# Patient Record
Sex: Male | Born: 1960 | Race: Black or African American | Hispanic: No | Marital: Single | Smoking: Former smoker
Health system: Southern US, Community
[De-identification: ages and names within clinical notes are randomized; demographics above are authoritative.]

## PROBLEM LIST (undated history)

## (undated) DIAGNOSIS — M25519 Pain in unspecified shoulder: Secondary | ICD-10-CM

## (undated) DIAGNOSIS — B009 Herpesviral infection, unspecified: Secondary | ICD-10-CM

## (undated) DIAGNOSIS — Z87891 Personal history of nicotine dependence: Secondary | ICD-10-CM

## (undated) DIAGNOSIS — N529 Male erectile dysfunction, unspecified: Secondary | ICD-10-CM

## (undated) DIAGNOSIS — K5900 Constipation, unspecified: Secondary | ICD-10-CM

## (undated) DIAGNOSIS — E785 Hyperlipidemia, unspecified: Secondary | ICD-10-CM

## (undated) DIAGNOSIS — Z9889 Other specified postprocedural states: Secondary | ICD-10-CM

## (undated) DIAGNOSIS — K5790 Diverticulosis of intestine, part unspecified, without perforation or abscess without bleeding: Secondary | ICD-10-CM

## (undated) DIAGNOSIS — I10 Essential (primary) hypertension: Secondary | ICD-10-CM

## (undated) DIAGNOSIS — G8929 Other chronic pain: Secondary | ICD-10-CM

## (undated) DIAGNOSIS — T7840XA Allergy, unspecified, initial encounter: Secondary | ICD-10-CM

## (undated) DIAGNOSIS — R7301 Impaired fasting glucose: Secondary | ICD-10-CM

## (undated) DIAGNOSIS — Z9289 Personal history of other medical treatment: Secondary | ICD-10-CM

## (undated) HISTORY — DX: Constipation, unspecified: K59.00

## (undated) HISTORY — DX: Herpesviral infection, unspecified: B00.9

## (undated) HISTORY — DX: Diverticulosis of intestine, part unspecified, without perforation or abscess without bleeding: K57.90

## (undated) HISTORY — DX: Essential (primary) hypertension: I10

## (undated) HISTORY — DX: Other specified postprocedural states: Z98.890

## (undated) HISTORY — DX: Hyperlipidemia, unspecified: E78.5

## (undated) HISTORY — DX: Personal history of nicotine dependence: Z87.891

## (undated) HISTORY — DX: Personal history of other medical treatment: Z92.89

## (undated) HISTORY — PX: SPINE SURGERY: SHX786

## (undated) HISTORY — DX: Other chronic pain: G89.29

## (undated) HISTORY — DX: Impaired fasting glucose: R73.01

## (undated) HISTORY — DX: Male erectile dysfunction, unspecified: N52.9

## (undated) HISTORY — DX: Allergy, unspecified, initial encounter: T78.40XA

## (undated) HISTORY — DX: Pain in unspecified shoulder: M25.519

---

## 2005-12-11 ENCOUNTER — Emergency Department (HOSPITAL_COMMUNITY): Admission: EM | Admit: 2005-12-11 | Discharge: 2005-12-11 | Payer: Self-pay | Admitting: Emergency Medicine

## 2006-09-20 ENCOUNTER — Emergency Department (HOSPITAL_COMMUNITY): Admission: EM | Admit: 2006-09-20 | Discharge: 2006-09-20 | Payer: Self-pay | Admitting: Family Medicine

## 2006-10-16 ENCOUNTER — Emergency Department (HOSPITAL_COMMUNITY): Admission: EM | Admit: 2006-10-16 | Discharge: 2006-10-16 | Payer: Self-pay | Admitting: Family Medicine

## 2006-11-30 ENCOUNTER — Ambulatory Visit: Payer: Self-pay | Admitting: Family Medicine

## 2007-03-05 ENCOUNTER — Ambulatory Visit: Payer: Self-pay | Admitting: Family Medicine

## 2007-07-03 ENCOUNTER — Ambulatory Visit: Payer: Self-pay | Admitting: Family Medicine

## 2007-07-19 ENCOUNTER — Ambulatory Visit: Payer: Self-pay | Admitting: Family Medicine

## 2007-09-14 ENCOUNTER — Ambulatory Visit: Payer: Self-pay | Admitting: Family Medicine

## 2008-03-05 ENCOUNTER — Ambulatory Visit: Payer: Self-pay | Admitting: Family Medicine

## 2008-03-20 DIAGNOSIS — Z9289 Personal history of other medical treatment: Secondary | ICD-10-CM

## 2008-03-20 HISTORY — DX: Personal history of other medical treatment: Z92.89

## 2008-08-26 ENCOUNTER — Ambulatory Visit: Payer: Self-pay | Admitting: Family Medicine

## 2009-08-15 HISTORY — PX: OTHER SURGICAL HISTORY: SHX169

## 2009-09-10 ENCOUNTER — Ambulatory Visit: Payer: Self-pay | Admitting: Family Medicine

## 2010-01-15 ENCOUNTER — Ambulatory Visit: Payer: Self-pay | Admitting: Family Medicine

## 2010-04-05 ENCOUNTER — Ambulatory Visit: Payer: Self-pay | Admitting: Family Medicine

## 2010-04-22 ENCOUNTER — Encounter: Admission: RE | Admit: 2010-04-22 | Discharge: 2010-04-22 | Payer: Self-pay | Admitting: Family Medicine

## 2010-04-23 ENCOUNTER — Emergency Department (HOSPITAL_COMMUNITY): Admission: EM | Admit: 2010-04-23 | Discharge: 2010-04-23 | Payer: Self-pay | Admitting: Emergency Medicine

## 2010-07-21 ENCOUNTER — Inpatient Hospital Stay (HOSPITAL_COMMUNITY)
Admission: RE | Admit: 2010-07-21 | Discharge: 2010-07-23 | Payer: Self-pay | Source: Home / Self Care | Attending: Neurosurgery | Admitting: Neurosurgery

## 2010-09-04 ENCOUNTER — Encounter: Payer: Self-pay | Admitting: Neurosurgery

## 2010-09-05 ENCOUNTER — Encounter: Payer: Self-pay | Admitting: Family Medicine

## 2010-09-29 ENCOUNTER — Encounter (INDEPENDENT_AMBULATORY_CARE_PROVIDER_SITE_OTHER): Payer: BC Managed Care – PPO | Admitting: Family Medicine

## 2010-09-29 ENCOUNTER — Encounter: Payer: Self-pay | Admitting: Gastroenterology

## 2010-09-29 DIAGNOSIS — F528 Other sexual dysfunction not due to a substance or known physiological condition: Secondary | ICD-10-CM

## 2010-09-29 DIAGNOSIS — Z79899 Other long term (current) drug therapy: Secondary | ICD-10-CM

## 2010-09-29 DIAGNOSIS — Z0000 Encounter for general adult medical examination without abnormal findings: Secondary | ICD-10-CM

## 2010-09-29 DIAGNOSIS — E785 Hyperlipidemia, unspecified: Secondary | ICD-10-CM

## 2010-10-05 ENCOUNTER — Encounter (INDEPENDENT_AMBULATORY_CARE_PROVIDER_SITE_OTHER): Payer: Self-pay | Admitting: *Deleted

## 2010-10-06 NOTE — Letter (Signed)
Summary: Pre Visit Letter Revised  [facility]  [address] [place], [place] [address]   Phone: [phone]  Fax: [phone]        09/29/2010 MRN: [id number]  James Bird [address] [place], [place]  [address]             Procedure Date:  10-20-10 11:30am           Dr Russella Dar Direct Colon   Welcome to the Gastroenterology Division at [facility].    You are scheduled to see a nurse for your pre-procedure visit on 10-06-10 at 1:30pm on the 3rd floor at [facility], [address]  We ask that you try to arrive at our office 15 minutes prior to your appointment time to allow for check-in.  Please take a minute to review the attached form.  If you answer "Yes" to one or more of the questions on the first page, we ask that you call the person listed at your earliest opportunity.  If you answer "No" to all of the questions, please complete the rest of the form and bring it to your appointment.    Your nurse visit will consist of discussing your medical and surgical history, your immediate family medical history, and your medications.   If you are unable to list all of your medications on the form, please bring the medication bottles to your appointment and we will list them.  We will need to be aware of both prescribed and over the counter drugs.  We will need to know exact dosage information as well.    Please be prepared to read and sign documents such as consent forms, a financial agreement, and acknowledgement forms.  If necessary, and with your consent, a friend or relative is welcome to sit-in on the nurse visit with you.  Please bring your insurance card so that we may make a copy of it.  If your insurance requires a referral to see a specialist, please bring your referral form from your primary care physician.  No co-pay is required for this nurse visit.     If you cannot keep your appointment, please call [phone] to cancel or reschedule prior  to your appointment date.  This allows [place] the opportunity to schedule an appointment for another patient in need of care.    Thank you for choosing [facility] for your medical needs.  We appreciate the opportunity to care for you.  Please visit [place] at our website  to learn more about our practice.  Sincerely, The Gastroenterology Division

## 2010-10-11 ENCOUNTER — Encounter: Payer: Self-pay | Admitting: Internal Medicine

## 2010-10-14 HISTORY — PX: COLONOSCOPY: SHX174

## 2010-10-20 ENCOUNTER — Encounter: Payer: Self-pay | Admitting: Internal Medicine

## 2010-10-20 ENCOUNTER — Other Ambulatory Visit (AMBULATORY_SURGERY_CENTER): Payer: BC Managed Care – PPO | Admitting: Internal Medicine

## 2010-10-20 DIAGNOSIS — Z1211 Encounter for screening for malignant neoplasm of colon: Secondary | ICD-10-CM

## 2010-10-20 DIAGNOSIS — K5730 Diverticulosis of large intestine without perforation or abscess without bleeding: Secondary | ICD-10-CM

## 2010-10-21 NOTE — Miscellaneous (Signed)
Summary: [facility]  Clinical Lists Changes  Medications: Added new medication of MOVIPREP 100 GM  SOLR (PEG-KCL-NACL-NASULF-NA ASC-C) As per prep instructions. - Signed Rx of MOVIPREP 100 GM  SOLR (PEG-KCL-NACL-NASULF-NA ASC-C) As per prep instructions.;  #1 x 0;  Signed;  Entered by: Ezra Sites RN;  Authorized by: Hilarie Fredrickson MD;  Method used: Electronically to [facility]. [phone]*, [address] [place], [place]  [address], Ph: [phone], Fax: [phone] Observations: Added new observation of NKA: T (10/11/2010 15:38)    Prescriptions: MOVIPREP 100 GM  SOLR (PEG-KCL-NACL-NASULF-NA ASC-C) As per prep instructions.  #1 x 0   Entered by:   Ezra Sites RN   Authorized by:   Hilarie Fredrickson MD   Signed by:   Ezra Sites RN on 10/11/2010   Method used:   Electronically to        [facility] [address] [phone]* (retail)       [address] [place], [place]  [address]       Ph: [phone]       Fax: [phone]   RxID:   [id number]

## 2010-10-21 NOTE — Letter (Signed)
Summary: [facility] Instructions  [facility]  [address] [place], [place] [address]   Phone: [phone]  Fax: [phone]       JEX STRAUSBAUGH    1961-03-04    MRN: [id number]        Procedure Day Dorna Bloom:  [facility] 10/20/10     Arrival Time: 10:30am     Procedure Time: 11:30am     Location of Procedure:                    _ X_   Endoscopy Center (4th Floor)                        PREPARATION FOR COLONOSCOPY WITH MOVIPREP   Starting 5 days prior to your procedure FRIDAY 03/02  do not eat nuts, seeds, popcorn, corn, beans, peas,  salads, or any raw vegetables.  Do not take any fiber supplements (e.g. Metamucil, Citrucel, and Benefiber).  THE DAY BEFORE YOUR PROCEDURE         DATE: TUESDAY  03/06  1.  Drink clear liquids the entire day-NO SOLID FOOD  2.  Do not drink anything colored red or purple.  Avoid juices with pulp.  No orange juice.  3.  Drink at least 64 oz. (8 glasses) of fluid/clear liquids during the day to prevent dehydration and help the prep work efficiently.  CLEAR LIQUIDS INCLUDE: Water Jello Ice Popsicles Tea (sugar ok, no milk/cream) Powdered fruit flavored drinks Coffee (sugar ok, no milk/cream) Gatorade Juice: apple, white grape, white cranberry  Lemonade Clear bullion, consomm, broth Carbonated beverages (any kind) Strained chicken noodle soup Hard Candy                             4.  In the morning, mix first dose of MoviPrep solution:    Empty 1 Pouch A and 1 Pouch B into the disposable container    Add lukewarm drinking water to the top line of the container. Mix to dissolve    Refrigerate (mixed solution should be used within 24 hrs)  5.  Begin drinking the prep at 5:00 p.m. The MoviPrep container is divided by 4 marks.   Every 15 minutes drink the solution down to the next mark (approximately 8 oz) until the full liter is complete.   6.  Follow completed prep with 16 oz of clear liquid of your choice  (Nothing red or purple).  Continue to drink clear liquids until bedtime.  7.  Before going to bed, mix second dose of MoviPrep solution:    Empty 1 Pouch A and 1 Pouch B into the disposable container    Add lukewarm drinking water to the top line of the container. Mix to dissolve    Refrigerate  THE DAY OF YOUR PROCEDURE      DATE:  [facility]  03/07  Beginning at 6:30a.m. (5 hours before procedure):         1. Every 15 minutes, drink the solution down to the next mark (approx 8 oz) until the full liter is complete.  2. Follow completed prep with 16 oz. of clear liquid of your choice.    3. You may drink clear liquids until 9:30am  (2 HOURS BEFORE PROCEDURE).   MEDICATION INSTRUCTIONS  Unless otherwise instructed, you should take regular prescription medications with a small sip of water   as early as possible the morning of  your procedure.    Additional medication instructions: Do not take Lisinopril/HCTZ day of procedure.         OTHER INSTRUCTIONS  You will need a responsible adult at least 51 years of age to accompany you and drive you home.   This person must remain in the waiting room during your procedure.  Wear loose fitting clothing that is easily removed.  Leave jewelry and other valuables at home.  However, you may wish to bring a book to read or  an iPod/MP3 player to listen to music as you wait for your procedure to start.  Remove all body piercing jewelry and leave at home.  Total time from sign-in until discharge is approximately 2-3 hours.  You should go home directly after your procedure and rest.  You can resume normal activities the  day after your procedure.  The day of your procedure you should not:   Drive   Make legal decisions   Operate machinery   Drink alcohol   Return to work  You will receive specific instructions about eating, activities and medications before you leave.    The above instructions have been reviewed and  explained to me by   Ezra Sites RN  October 11, 2010 4:02 PM    I fully understand and can verbalize these instructions _____________________________ Date _________

## 2010-10-25 LAB — BASIC METABOLIC PANEL
CO2: 28 mEq/L (ref 19–32)
Chloride: 104 mEq/L (ref 96–112)
Creatinine, Ser: 0.89 mg/dL (ref 0.4–1.5)
GFR calc Af Amer: >60 mL/min (ref >60)

## 2010-10-25 LAB — CBC
MCH: 31.9 pg (ref 26.0–34.0)
MCV: 87.4 fL (ref 78.0–100.0)
Platelets: 207 10*3/uL (ref 150–400)
RBC: 4.99 MIL/uL (ref 4.22–5.81)

## 2010-10-26 NOTE — Procedures (Signed)
Summary: Colonoscopy  Patient: James Bird Note: All result statuses are Final unless otherwise noted.  Tests: (1) Colonoscopy (COL)   COL Colonoscopy           DONE     [facility]     [address]     [place], [place]  [address]          COLONOSCOPY PROCEDURE REPORT          PATIENT:  James Bird, James Bird  MR#:  [id number]     BIRTHDATE:  [birth year], 49 yrs. old  GENDER:  male     ENDOSCOPIST:  Wilhemina Bonito. Eda Keys, MD     REF. BY:  Sharlot Gowda, M.D.     PROCEDURE DATE:  10/20/2010     PROCEDURE:  Average-risk screening colonoscopy     G0121     ASA CLASS:  Class II     INDICATIONS:  Routine Risk Screening     MEDICATIONS:   Fentanyl 75 mcg IV, Versed 8 mg IV          DESCRIPTION OF PROCEDURE:   After the risks benefits and     alternatives of the procedure were thoroughly explained, informed     consent was obtained.  Digital rectal exam was performed and     revealed no abnormalities.   The LB 180AL [id number] endoscope was     introduced through the anus and advanced to the cecum, which was     identified by both the appendix and ileocecal valve, without     limitations.Time to cecum = 3:58 min. The quality of the prep was     good, using MoviPrep.  The instrument was then slowly withdrawn     (time = 8:56 min) as the colon was fully examined.     <<PROCEDUREIMAGES>>          FINDINGS:  Mild diverticulosis was found in the sigmoid colon.     Otherwise normal colonoscopy without  polyps, masses, vascular     ectasias, or inflammatory changes.   Retroflexed views in the     rectum revealed no abnormalities.    The scope was then withdrawn     from the patient and the procedure completed.          COMPLICATIONS:  None          ENDOSCOPIC IMPRESSION:     1) Mild diverticulosis in the sigmoid colon     2) Otherwise normal colonoscopy          RECOMMENDATIONS:     1) Continue current colorectal screening recommendations for     "routine risk" patients with a  repeat colonoscopy in 10 years.          ______________________________     Wilhemina Bonito. Eda Keys, MD          CC:  Sharlot Gowda, MD; The Patient          n.     eSIGNED:   Wilhemina Bonito. Eda Keys at 10/20/2010 11:54 AM          Alric Seton, [id number]  Note: An exclamation mark (!) indicates a result that was not dispersed into the flowsheet. Document Creation Date: 10/20/2010 11:54 AM _______________________________________________________________________  (1) Order result status: Final Collection or observation date-time: 10/20/2010 11:44 Requested date-time:  Receipt date-time:  Reported date-time:  Referring Physician:   Ordering Physician: Fransico Setters [phone]) Specimen Source:  Source: Launa Grill Order Number: [id number] Lab  site:   Appended Document: Colonoscopy    Clinical Lists Changes  Observations: Added new observation of COLONNXTDUE: 10/2020 (10/20/2010 12:06)

## 2010-11-12 ENCOUNTER — Other Ambulatory Visit: Payer: Self-pay | Admitting: Neurosurgery

## 2010-11-12 DIAGNOSIS — M549 Dorsalgia, unspecified: Secondary | ICD-10-CM

## 2010-11-12 DIAGNOSIS — M5410 Radiculopathy, site unspecified: Secondary | ICD-10-CM

## 2011-06-17 ENCOUNTER — Other Ambulatory Visit: Payer: Self-pay | Admitting: Family Medicine

## 2011-06-20 NOTE — Telephone Encounter (Signed)
Is this okay to refill? 

## 2011-07-12 ENCOUNTER — Encounter: Payer: Self-pay | Admitting: Family Medicine

## 2011-09-15 ENCOUNTER — Other Ambulatory Visit: Payer: Self-pay | Admitting: Family Medicine

## 2011-09-15 NOTE — Telephone Encounter (Signed)
IS THIS OK 

## 2011-09-19 NOTE — Telephone Encounter (Signed)
Is this ok?

## 2011-11-02 ENCOUNTER — Ambulatory Visit (INDEPENDENT_AMBULATORY_CARE_PROVIDER_SITE_OTHER): Payer: BC Managed Care – PPO | Admitting: Medical

## 2011-11-02 ENCOUNTER — Encounter: Payer: Self-pay | Admitting: Medical

## 2011-11-02 VITALS — BP 110/80 | HR 80 | Temp 98.1°F | Resp 16 | Ht 71.0 in | Wt 237.0 lb

## 2011-11-02 DIAGNOSIS — Z125 Encounter for screening for malignant neoplasm of prostate: Secondary | ICD-10-CM

## 2011-11-02 DIAGNOSIS — N529 Male erectile dysfunction, unspecified: Secondary | ICD-10-CM | POA: Insufficient documentation

## 2011-11-02 DIAGNOSIS — L738 Other specified follicular disorders: Secondary | ICD-10-CM

## 2011-11-02 DIAGNOSIS — B353 Tinea pedis: Secondary | ICD-10-CM

## 2011-11-02 DIAGNOSIS — R7301 Impaired fasting glucose: Secondary | ICD-10-CM | POA: Insufficient documentation

## 2011-11-02 DIAGNOSIS — E663 Overweight: Secondary | ICD-10-CM | POA: Insufficient documentation

## 2011-11-02 DIAGNOSIS — I10 Essential (primary) hypertension: Secondary | ICD-10-CM | POA: Insufficient documentation

## 2011-11-02 DIAGNOSIS — F17200 Nicotine dependence, unspecified, uncomplicated: Secondary | ICD-10-CM

## 2011-11-02 DIAGNOSIS — B350 Tinea barbae and tinea capitis: Secondary | ICD-10-CM

## 2011-11-02 DIAGNOSIS — Z0000 Encounter for general adult medical examination without abnormal findings: Secondary | ICD-10-CM

## 2011-11-02 LAB — CBC WITH DIFFERENTIAL/PLATELET
Basophils Absolute: 0 10*3/uL (ref 0.0–0.1)
Lymphocytes Relative: 33 % (ref 12–46)
Lymphs Abs: 2.4 10*3/uL (ref 0.7–4.0)
Neutro Abs: 4.2 10*3/uL (ref 1.7–7.7)
Neutrophils Relative %: 59 % (ref 43–77)
Platelets: 257 10*3/uL (ref 150–400)
RBC: 5.16 MIL/uL (ref 4.22–5.81)
WBC: 7.1 10*3/uL (ref 4.0–10.5)

## 2011-11-02 LAB — COMPREHENSIVE METABOLIC PANEL
Albumin: 4.7 g/dL (ref 3.5–5.2)
CO2: 27 mEq/L (ref 19–32)
Calcium: 9.9 mg/dL (ref 8.4–10.5)
Chloride: 101 mEq/L (ref 96–112)
Glucose, Bld: 83 mg/dL (ref 70–99)
Potassium: 4.1 mEq/L (ref 3.5–5.3)
Sodium: 141 mEq/L (ref 135–145)
Total Bilirubin: 0.9 mg/dL (ref 0.3–1.2)
Total Protein: 7.5 g/dL (ref 6.0–8.3)

## 2011-11-02 LAB — PSA: PSA: 2.29 ng/mL (ref <=4.00)

## 2011-11-02 LAB — HEMOGLOBIN A1C: Mean Plasma Glucose: 117 mg/dL — ABNORMAL HIGH (ref <117)

## 2011-11-02 LAB — LIPID PANEL
Cholesterol: 212 mg/dL — ABNORMAL HIGH (ref 0–200)
Triglycerides: 132 mg/dL (ref <150)

## 2011-11-02 MED ORDER — BUPROPION HCL ER (XL) 150 MG PO TB24: 150.0000 mg | ORAL_TABLET | Freq: Every day | ORAL | Status: AC

## 2011-11-02 MED ORDER — SILDENAFIL CITRATE 100 MG PO TABS: 100.0000 mg | ORAL_TABLET | ORAL | Status: DC | PRN

## 2011-11-02 MED ORDER — LISINOPRIL-HYDROCHLOROTHIAZIDE 10-12.5 MG PO TABS: 1.0000 | ORAL_TABLET | Freq: Every day | ORAL | Status: DC

## 2011-11-02 MED ORDER — VARENICLINE TARTRATE 0.5 MG X 11 & 1 MG X 42 PO MISC: ORAL | Status: DC

## 2011-11-02 NOTE — Patient Instructions (Addendum)
Preventative Care for Adults, Male       REGULAR HEALTH EXAMS:  A routine yearly physical is a good way to check in with your primary care provider about your health and preventive screening. It is also an opportunity to share updates about your health and any concerns you have, and receive a thorough all-over exam.   Most health insurance companies pay for at least some preventative services.  Check with your health plan for specific coverages.  WHAT PREVENTATIVE SERVICES DO MEN NEED?  Adult men should have their weight and blood pressure checked regularly.   Men age 52 and older should have their cholesterol levels checked regularly.  Beginning at age 52 and continuing to age 75, men should be screened for colorectal cancer.  Certain people should may need continued testing until age 85.  Other cancer screening may include exams for testicular and prostate cancer.  Updating vaccinations is part of preventative care.  Vaccinations help protect against diseases such as the flu.  Lab tests are generally done as part of preventative care to screen for anemia and blood disorders, to screen for problems with the kidneys and liver, to screen for bladder problems, to check blood sugar, and to check your cholesterol level.  Preventative services generally include counseling about diet, exercise, avoiding tobacco, drugs, excessive alcohol consumption, and sexually transmitted infections.    GENERAL RECOMMENDATIONS FOR GOOD HEALTH:  Healthy diet:  Eat a variety of foods, including fruit, vegetables, animal or vegetable protein, such as meat, fish, chicken, and eggs, or beans, lentils, tofu, and grains, such as rice.  Drink plenty of water daily.  Decrease saturated fat in the diet, avoid lots of red meat, processed foods, sweets, fast foods, and fried foods.  Exercise:  Aerobic exercise helps maintain good heart health. At least 30-40 minutes of moderate-intensity exercise is recommended.  For example, a brisk walk that increases your heart rate and breathing. This should be done on most days of the week.   Find a type of exercise or a variety of exercises that you enjoy so that it becomes a part of your daily life.  Examples are running, walking, swimming, water aerobics, and biking.  For motivation and support, explore group exercise such as aerobic class, spin class, Zumba, Yoga,or  martial arts, etc.    Set exercise goals for yourself, such as a certain weight goal, walk or run in a race such as a 5k walk/run.  Speak to your primary care provider about exercise goals.  Disease prevention:  If you smoke or chew tobacco, find out from your caregiver how to quit. It can literally save your life, no matter how long you have been a tobacco user. If you do not use tobacco, never begin.   Maintain a healthy diet and normal weight. Increased weight leads to problems with blood pressure and diabetes.   The Body Mass Index or BMI is a way of measuring how much of your body is fat. Having a BMI above 27 increases the risk of heart disease, diabetes, hypertension, stroke and other problems related to obesity. Your caregiver can help determine your BMI and based on it develop an exercise and dietary program to help you achieve or maintain this important measurement at a healthful level.  High blood pressure causes heart and blood vessel problems.  Persistent high blood pressure should be treated with medicine if weight loss and exercise do not work.   Fat and cholesterol leaves deposits in your arteries   that can block them. This causes heart disease and vessel disease elsewhere in your body.  If your cholesterol is found to be high, or if you have heart disease or certain other medical conditions, then you may need to have your cholesterol monitored frequently and be treated with medication.   Ask if you should have a stress test if your history suggests this. A stress test is a test done on  a treadmill that looks for heart disease. This test can find disease prior to there being a problem.  Avoid drinking alcohol in excess (more than two drinks per day).  Avoid use of street drugs. Do not share needles with anyone. Ask for professional help if you need assistance or instructions on stopping the use of alcohol, cigarettes, and/or drugs.  Brush your teeth twice a day with fluoride toothpaste, and floss once a day. Good oral hygiene prevents tooth decay and gum disease. The problems can be painful, unattractive, and can cause other health problems. Visit your dentist for a routine oral and dental check up and preventive care every 6-12 months.   Look at your skin regularly.  Use a mirror to look at your back. Notify your caregivers of changes in moles, especially if there are changes in shapes, colors, a size larger than a pencil eraser, an irregular border, or development of new moles.  Safety:  Use seatbelts 100% of the time, whether driving or as a passenger.  Use safety devices such as hearing protection if you work in environments with loud noise or significant background noise.  Use safety glasses when doing any work that could send debris in to the eyes.  Use a helmet if you ride a bike or motorcycle.  Use appropriate safety gear for contact sports.  Talk to your caregiver about gun safety.  Use sunscreen with a SPF (or skin protection factor) of 15 or greater.  Lighter skinned people are at a greater risk of skin cancer. Don't forget to also wear sunglasses in order to protect your eyes from too much damaging sunlight. Damaging sunlight can accelerate cataract formation.   Practice safe sex. Use condoms. Condoms are used for birth control and to help reduce the spread of sexually transmitted infections (or STIs).  Some of the STIs are gonorrhea (the clap), chlamydia, syphilis, trichomonas, herpes, HPV (human papilloma virus) and HIV (human immunodeficiency virus) which causes AIDS.  The herpes, HIV and HPV are viral illnesses that have no cure. These can result in disability, cancer and death.   Keep carbon monoxide and smoke detectors in your home functioning at all times. Change the batteries every 6 months or use a model that plugs into the wall.   Vaccinations:  Stay up to date with your tetanus shots and other required immunizations. You should have a booster for tetanus every 10 years. Be sure to get your flu shot every year, since 5%-20% of the [place] population comes down with the flu. The flu vaccine changes each year, so being vaccinated once is not enough. Get your shot in the fall, before the flu season peaks.   Other vaccines to consider:  Pneumococcal vaccine to protect against certain types of pneumonia.  This is normally recommended for adults age 65 or older.  However, adults younger than 52 years old with certain underlying conditions such as diabetes, heart or lung disease should also receive the vaccine.  Shingles vaccine to protect against Varicella Zoster if you are older than age 60, or younger   than 52 years old with certain underlying illness.  Hepatitis A vaccine to protect against a form of infection of the liver by a virus acquired from food.  Hepatitis B vaccine to protect against a form of infection of the liver by a virus acquired from blood or body fluids, particularly if you work in health care.  If you plan to travel internationally, check with your local health department for specific vaccination recommendations.  Cancer Screening:  Most routine colon cancer screening begins at the age of 16. On a yearly basis, doctors may provide special easy to use take-home tests to check for hidden blood in the stool. Sigmoidoscopy or colonoscopy can detect the earliest forms of colon cancer and is life saving. These tests use a small camera at the end of a tube to directly examine the colon. Speak to your caregiver about this at age 52, when routine  screening begins (and is repeated every 5 years unless early forms of pre-cancerous polyps or small growths are found).   At the age of 85 men usually start screening for prostate cancer every year. Screening may begin at a younger age for those with higher risk. Those at higher risk include African-Americans or having a family history of prostate cancer. There are two types of tests for prostate cancer:   Prostate-specific antigen (PSA) testing. Recent studies raise questions about prostate cancer using PSA and you should discuss this with your caregiver.   Digital rectal exam (in which your doctor's lubricated and gloved finger feels for enlargement of the prostate through the anus).   Screening for testicular cancer.  Do a monthly exam of your testicles. Gently roll each testicle between your thumb and fingers, feeling for any abnormal lumps. The best time to do this is after a hot shower or bath when the tissues are looser. Notify your caregivers of any lumps, tenderness or changes in size or shape immediately.        YOU CAN QUIT SMOKING!  Talk to your medical provider about using medicines to help you quit. These include nicotine replacement gum, lozenges, or skin patches.  Consider calling 1-800-QUIT-NOW, a toll free 24/7 hotline with free counseling to help you quit.  If you are ready to quit smoking or are thinking about it, congratulations! You have chosen to help yourself be healthier and live longer! There are lots of different ways to quit smoking. Nicotine gum, nicotine patches, a nicotine inhaler, or nicotine nasal spray can help with physical craving. Hypnosis, support groups, and medicines help break the habit of smoking. TIPS TO GET OFF AND STAY OFF CIGARETTES  Learn to predict your moods. Do not let a bad situation be your excuse to have a cigarette. Some situations in your life might tempt you to have a cigarette.   Ask friends and co-workers not to smoke around you.    Make your home smoke-free.   Never have "just one" cigarette. It leads to wanting another and another. Remind yourself of your decision to quit.   On a card, make a list of your reasons for not smoking. Read it at least the same number of times a day as you have a cigarette. Tell yourself everyday, "I do not want to smoke. I choose not to smoke."   Ask someone at home or work to help you with your plan to quit smoking.   Have something planned after you eat or have a cup of coffee. Take a walk or get other exercise  to perk you up. This will help to keep you from overeating.   Try a relaxation exercise to calm you down and decrease your stress. Remember, you may be tense and nervous the first two weeks after you quit. This will pass.   Find new activities to keep your hands busy. Play with a pen, coin, or rubber band. Doodle or draw things on paper.   Brush your teeth right after eating. This will help cut down the craving for the taste of tobacco after meals. You can try mouthwash too.   Try gum, breath mints, or diet candy to keep something in your mouth.  IF YOU SMOKE AND WANT TO QUIT:  Do not stock up on cigarettes. Never buy a carton. Wait until one pack is finished before you buy another.   Never carry cigarettes with you at work or at home.   Keep cigarettes as far away from you as possible. Leave them with someone else.   Never carry matches or a lighter with you.   Ask yourself, "Do I need this cigarette or is this just a reflex?"   Bet with someone that you can quit. Put cigarette money in a piggy bank every morning. If you smoke, you give up the money. If you do not smoke, by the end of the week, you keep the money.   Keep trying. It takes 21 days to change a habit!  Document Released: 05/28/2009 Document Revised: 04/13/2011 Document Reviewed: 05/28/2009 [facility] Patient Information 2012 [place], [place].

## 2011-11-02 NOTE — Progress Notes (Signed)
Subjective:   HPI  James Bird is a 51 y.o. male who presents for a complete physical.  He has a few c/o.  He notes some flaking of his skin of feet. He is interested in medication to help stop smoking.  He also has moles he is concerned about. Wants diabetes screening.  He is fasting today.   Preventative care: Last ophthalmology - over 1 year ago Last dental in last 3 months Gets yearly flu shot.  Reviewed their medical, surgical, family, social, medication, and allergy history and updated chart as appropriate.  Past Medical History  Diagnosis Date  . Hyperlipidemia   . Hypertension   . ED (erectile dysfunction)   . Allergy     RHINITS  . Diverticulosis   . Impaired fasting glucose   . Substance abuse     TOBACCO (SMOKER)    Past Surgical History  Procedure Date  . Spine surgery 2011(JENKINS,MD)    CERVICAL DISC AND FUSION  . Colonoscopy 10/2010    diverticulosis, othewrise normal, repeat 10 years, Dr. Marina Goodell    Family History  Problem Relation Age of Onset  . Cancer Mother     LUNG  . Heart disease Father     MI  . Cancer Paternal Aunt   . Cancer Paternal Uncle   . Hypertension Brother   . Diabetes Neg Hx   . Stroke Neg Hx     History   Social History  . Marital Status: Single    Spouse Name: N/A    Number of Children: N/A  . Years of Education: N/A   Occupational History  . Building control surveyor    Social History Main Topics  . Smoking status: Current Everyday Smoker -- 0.5 packs/day for 15 years    Types: Cigarettes  . Smokeless tobacco: Not on file  . Alcohol Use: 3.0 oz/week    5 Cans of beer per week  . Drug Use: No  . Sexually Active: Not on file   Other Topics Concern  . Not on file   Social History Narrative   Single, 2 daughters, exercise - raquetball and walking    Current Outpatient Prescriptions on File Prior to Visit  Medication Sig Dispense Refill  . lisinopril-hydrochlorothiazide (PRINZIDE,ZESTORETIC) 10-12.5 MG per  tablet TAKE 1 TABLET BY MOUTH EVERY DAY  90 tablet  3  . VIAGRA 100 MG tablet TAKE AS DIRECTED  10 tablet  0    No Known Allergies  Review of Systems Constitutional: -fever, -chills, -sweats, -unexpected weight change, -anorexia, -fatigue Allergy: -sneezing, -itching, -congestion Dermatology: denies changing moles, rash, lumps, new worrisome lesions ENT: -runny nose, -ear pain, -sore throat, -hoarseness, -sinus pain, -teeth pain, -tinnitus, -hearing loss, -epistaxis Cardiology:  -chest pain, -palpitations, -edema, -orthopnea, -paroxysmal nocturnal dyspnea Respiratory: +cough, -shortness of breath, -dyspnea on exertion, -wheezing, -hemoptysis Gastroenterology: -abdominal pain, -nausea, -vomiting, -diarrhea, -constipation, -blood in stool, -changes in bowel movement, -dysphagia Hematology: -bleeding or bruising problems Musculoskeletal: -arthralgias, +myalgias, -joint swelling, -back pain, -neck pain, -cramping, -gait changes Ophthalmology: -vision changes, -eye redness, -itching, -discharge Urology: -dysuria, -difficulty urinating, -hematuria, -urinary frequency, -urgency, incontinence Neurology: -headache, -weakness, -tingling, -numbness, -speech abnormality, -memory loss, -falls, -dizziness Psychology:  -depressed mood, -agitation, -sleep problems      Objective:   Physical Exam  Filed Vitals:   11/02/11 1400  BP: 110/80  Pulse: 80  Temp: 98.1 F (36.7 C)  Resp: 16    General appearance: alert, no distress, WD/WN, overweight black male Skin: left neck with 3  large skin tags, right medial 3rd finger with wart, several benign appearing macules of face, and erythema and raised papular lesions of scalp suggestive of seborrheic dermatitis and similar inflamed lesion of neck suggestive of folliculitis barbae HEENT: normocephalic, conjunctiva/corneas normal, sclerae anicteric, PERRLA, EOMi, nares patent, no discharge or erythema, pharynx normal Oral cavity: MMM, tongue normal, teeth  in good repair Neck: supple, no lymphadenopathy, no thyromegaly, no masses, normal ROM, no bruits Chest: non tender, normal shape and expansion Heart: RRR, normal S1, S2, no murmurs Lungs: CTA bilaterally, no wheezes, rhonchi, or rales Abdomen: +bs, soft, +1.3cm umbilical hernia, reducible, otherwise non tender, non distended, no masses, no hepatomegaly, no splenomegaly, no bruits Back: non tender, mild tenderness lumbar region, decrease ROM due to chronic low back pain, no scoliosis Musculoskeletal: upper extremities non tender, no obvious deformity, normal ROM throughout, lower extremities non tender, no obvious deformity, normal ROM throughout Extremities: no edema, no cyanosis, no clubbing Pulses: 2+ symmetric, upper and lower extremities, normal cap refill Neurological: alert, oriented x 3, CN2-12 intact, strength normal upper extremities and lower extremities, sensation normal throughout, DTRs 2+ throughout, no cerebellar signs, gait normal Psychiatric: normal affect, behavior normal, pleasant  GU: normal male external genitalia, uncircumcised, nontender, no masses, no hernia, no lymphadenopathy Rectal: anus normal, prostate WNL, no obvious hemorrhoids, occult neg stool   Assessment and Plan :    Encounter Diagnoses  Name Primary?  . Routine general medical examination at a health care facility Yes  . ED (erectile dysfunction)   . Essential hypertension, benign   . Impaired fasting blood sugar   . Screening for prostate cancer   . Tobacco use disorder   . Tinea pedis   . Overweight     Physical exam - discussed healthy lifestyle, diet, exercise, preventative care, vaccinations, and addressed their concerns.  Tdap and flu shot UTD.  Referral to dermatology for multiple skin issues (skin tags, folliculitis barbae, seborrheic dermatitis).   ED - doing well on Viagra, refills today  HTN - controlled on current medication, refills today  Impaired fasting glucose - labs  today  PSA screening today  Tobacco use disorder - discussed dangers of use, he agrees to begin Wellbutrin and seems motivated.  discussed risks/benefits of medications.  recheck 78mo.    Tinea pedis - begin OTC Tinactin QHS x 1-2 mo  Overweight - discussed needs to lose weight, will work on diet and exercise   Follow-up pending labs

## 2012-01-12 ENCOUNTER — Ambulatory Visit
Admission: RE | Admit: 2012-01-12 | Discharge: 2012-01-12 | Disposition: A | Discharge status: Home / Self Care | Payer: BC Managed Care – PPO | Source: Ambulatory Visit | Attending: Neurosurgery | Admitting: Neurosurgery

## 2012-01-12 DIAGNOSIS — M549 Dorsalgia, unspecified: Secondary | ICD-10-CM

## 2012-01-12 DIAGNOSIS — M5410 Radiculopathy, site unspecified: Secondary | ICD-10-CM

## 2012-03-01 ENCOUNTER — Ambulatory Visit (INDEPENDENT_AMBULATORY_CARE_PROVIDER_SITE_OTHER): Payer: BC Managed Care – PPO | Admitting: Family Medicine

## 2012-03-01 ENCOUNTER — Encounter: Payer: Self-pay | Admitting: Family Medicine

## 2012-03-01 VITALS — BP 118/78 | HR 88 | Temp 99.7°F | Wt 226.0 lb

## 2012-03-01 DIAGNOSIS — B349 Viral infection, unspecified: Secondary | ICD-10-CM

## 2012-03-01 DIAGNOSIS — B9789 Other viral agents as the cause of diseases classified elsewhere: Secondary | ICD-10-CM

## 2012-03-01 NOTE — Patient Instructions (Signed)
You can take 4 ibuprofen 3 times per day for the fever aches and pains

## 2012-03-01 NOTE — Progress Notes (Signed)
  Subjective:    Patient ID: James Bird, male    DOB: [birth year], 51 y.o.   MRN: [id number]  HPI He has a three-day history of fever and aching. No sore throat, earache, PND, nasal congestion with nausea and fatigue.  Review of Systems     Objective:   Physical Exam alert and in no distress. Tympanic membranes and canals are normal. Throat is clear. Tonsils are normal. Neck is supple without adenopathy or thyromegaly. Cardiac exam shows a regular sinus rhythm without murmurs or gallops. Lungs are clear to auscultation.        Assessment & Plan:   1. Viral syndrome    supportive care. If still having difficulty along week, return for further evaluation.

## 2012-10-17 ENCOUNTER — Telehealth: Payer: Self-pay | Admitting: Internal Medicine

## 2012-10-17 MED ORDER — LISINOPRIL-HYDROCHLOROTHIAZIDE 10-12.5 MG PO TABS: 1.0000 | ORAL_TABLET | Freq: Every day | ORAL | Status: DC

## 2012-10-17 NOTE — Telephone Encounter (Signed)
SENT IN MEDS 

## 2012-11-13 ENCOUNTER — Telehealth: Payer: Self-pay | Admitting: Internal Medicine

## 2012-11-13 MED ORDER — SILDENAFIL CITRATE 100 MG PO TABS: 100.0000 mg | ORAL_TABLET | ORAL | Status: DC | PRN

## 2012-11-13 NOTE — Telephone Encounter (Signed)
Refill request for Viagra 100mg  to [facility]

## 2012-11-13 NOTE — Telephone Encounter (Signed)
Due for yearly CPX

## 2012-11-14 NOTE — Telephone Encounter (Signed)
Patient is aware and he scheduled his appointment for his physical. CLS

## 2012-11-20 ENCOUNTER — Encounter: Payer: Self-pay | Admitting: Internal Medicine

## 2012-11-28 ENCOUNTER — Encounter: Payer: Self-pay | Admitting: Medical

## 2012-11-28 ENCOUNTER — Ambulatory Visit (INDEPENDENT_AMBULATORY_CARE_PROVIDER_SITE_OTHER): Payer: BC Managed Care – PPO | Admitting: Medical

## 2012-11-28 VITALS — BP 118/80 | HR 72 | Temp 98.6°F | Resp 16 | Ht 71.5 in | Wt 233.0 lb

## 2012-11-28 DIAGNOSIS — M25519 Pain in unspecified shoulder: Secondary | ICD-10-CM

## 2012-11-28 DIAGNOSIS — R5383 Other fatigue: Secondary | ICD-10-CM

## 2012-11-28 DIAGNOSIS — Z23 Encounter for immunization: Secondary | ICD-10-CM

## 2012-11-28 DIAGNOSIS — F17200 Nicotine dependence, unspecified, uncomplicated: Secondary | ICD-10-CM

## 2012-11-28 DIAGNOSIS — D239 Other benign neoplasm of skin, unspecified: Secondary | ICD-10-CM

## 2012-11-28 DIAGNOSIS — N529 Male erectile dysfunction, unspecified: Secondary | ICD-10-CM

## 2012-11-28 DIAGNOSIS — M25561 Pain in right knee: Secondary | ICD-10-CM

## 2012-11-28 DIAGNOSIS — R6882 Decreased libido: Secondary | ICD-10-CM

## 2012-11-28 DIAGNOSIS — M25512 Pain in left shoulder: Secondary | ICD-10-CM

## 2012-11-28 DIAGNOSIS — E785 Hyperlipidemia, unspecified: Secondary | ICD-10-CM

## 2012-11-28 DIAGNOSIS — R7301 Impaired fasting glucose: Secondary | ICD-10-CM

## 2012-11-28 DIAGNOSIS — D229 Melanocytic nevi, unspecified: Secondary | ICD-10-CM

## 2012-11-28 DIAGNOSIS — R5381 Other malaise: Secondary | ICD-10-CM

## 2012-11-28 DIAGNOSIS — I10 Essential (primary) hypertension: Secondary | ICD-10-CM

## 2012-11-28 DIAGNOSIS — Z0000 Encounter for general adult medical examination without abnormal findings: Secondary | ICD-10-CM

## 2012-11-28 DIAGNOSIS — M25569 Pain in unspecified knee: Secondary | ICD-10-CM

## 2012-11-28 DIAGNOSIS — Z113 Encounter for screening for infections with a predominantly sexual mode of transmission: Secondary | ICD-10-CM

## 2012-11-28 LAB — HEMOGLOBIN A1C
Hgb A1c MFr Bld: 5.7 % — ABNORMAL HIGH (ref <5.7)
Mean Plasma Glucose: 117 mg/dL — ABNORMAL HIGH (ref <117)

## 2012-11-28 LAB — CBC WITH DIFFERENTIAL/PLATELET
Basophils Absolute: 0 10*3/uL (ref 0.0–0.1)
Basophils Relative: 0 % (ref 0–1)
Eosinophils Absolute: 0.1 10*3/uL (ref 0.0–0.7)
Eosinophils Relative: 1 % (ref 0–5)
HCT: 47.6 % (ref 39.0–52.0)
Lymphocytes Relative: 26 % (ref 12–46)
MCH: 30.3 pg (ref 26.0–34.0)
MCHC: 34.2 g/dL (ref 30.0–36.0)
MCV: 88.5 fL (ref 78.0–100.0)
Monocytes Absolute: 0.7 10*3/uL (ref 0.1–1.0)
Platelets: 272 10*3/uL (ref 150–400)
RDW: 13.8 % (ref 11.5–15.5)
WBC: 7.2 10*3/uL (ref 4.0–10.5)

## 2012-11-28 LAB — POCT URINALYSIS DIPSTICK
Bilirubin, UA: NEGATIVE
Ketones, UA: NEGATIVE
Leukocytes, UA: NEGATIVE
Nitrite, UA: NEGATIVE
Protein, UA: NEGATIVE

## 2012-11-28 LAB — COMPREHENSIVE METABOLIC PANEL
ALT: 16 U/L (ref 0–53)
AST: 17 U/L (ref 0–37)
CO2: 29 mEq/L (ref 19–32)
Chloride: 103 mEq/L (ref 96–112)
Creat: 0.99 mg/dL (ref 0.50–1.35)
Sodium: 141 mEq/L (ref 135–145)
Total Bilirubin: 0.6 mg/dL (ref 0.3–1.2)
Total Protein: 7.4 g/dL (ref 6.0–8.3)

## 2012-11-28 LAB — LIPID PANEL
HDL: 46 mg/dL (ref >39)
LDL Cholesterol: 151 mg/dL — ABNORMAL HIGH (ref 0–99)

## 2012-11-28 MED ORDER — DICLOFENAC SODIUM 75 MG PO TBEC: 75.0000 mg | DELAYED_RELEASE_TABLET | Freq: Two times a day (BID) | ORAL | Status: DC

## 2012-11-28 NOTE — Progress Notes (Signed)
Subjective:   HPI  James Bird is a 52 y.o. male who presents for a complete physical.  Medical care team includes:  Dr. Lovell Sheehan - neck surgeon, Neurosurgeon  Dentist  Primary care here   Preventative care: Last ophthalmology visit:N/A Last dental visit:YES- [facility]/ [place] Last colonoscopy:10/2010 Dr. Marina Goodell Last prostate exam: 10/2011 Last EKG:03/2010 Last labs:10/2011  Prior vaccinations: TD or Tdap:08/2009 Influenza:yes- but not for the 2013/2014 season Pneumococcal:N/a Shingles/Zostavax:N/a  Advanced directive:N/a Health care power of attorney:n/A Living will:n/A  Concerns: HTN - compliant with medication without c/o.  BPs run normal.  Hyperlipidemia - never started medication a year ago.  Tries to exercise and eat healthy  ED - Viagra works well, no issues, no adverse effects.     Still smokes, not ready to quit.  Injured right knee playing racquetball a month ago, has had some ongoing pain in right knee.  At times swelling of the knee.  Can't do his normal exercise now.  Left shoulder give radiating pain throughout shoulder girdle, neck, arm x a few weeks.  No injury or trauma, but pain with lifting over head.    Wants STD screening to be sure no issues.  Concerns about his testosterone.  He reports decreased energy, fatigue, at times low libido.    Reviewed their medical, surgical, family, social, medication, and allergy history and updated chart as appropriate.   Past Medical History  Diagnosis Date  . Hyperlipidemia   . Hypertension   . ED (erectile dysfunction)   . Allergy     RHINITS  . Diverticulosis   . Impaired fasting glucose   . Substance abuse     TOBACCO (SMOKER)  . Smoker   . History of cardiovascular stress test 03/20/2008    dobutamine stress test, normal study, Dr. Reyes Ivan  . H/O echocardiogram 03/20/08    mild - moderate LVH, EF 50-55%, mild RV enlargement, mild mitral regurgitation, Dr. Reyes Ivan  . Chronic shoulder pain     left  .  History of neck surgery     Past Surgical History  Procedure Laterality Date  . Spine surgery  2011(JENKINS,MD)    CERVICAL DISC AND FUSION  . Colonoscopy  10/2010    diverticulosis, othewrise normal, repeat 10 years, Dr. Marina Goodell  . Cdisc & fusion  2011    Dr. Lovell Sheehan    Family History  Problem Relation Age of Onset  . Cancer Mother     LUNG  . Heart disease Father     MI  . Cancer Paternal Aunt   . Cancer Paternal Uncle   . Hypertension Brother   . Diabetes Neg Hx   . Stroke Neg Hx     History   Social History  . Marital Status: Single    Spouse Name: N/A    Number of Children: N/A  . Years of Education: N/A   Occupational History  . Building control surveyor    Social History Main Topics  . Smoking status: Current Every Day Smoker -- 0.50 packs/day for 15 years    Types: Cigarettes  . Smokeless tobacco: Not on file  . Alcohol Use: 3.0 oz/week    5 Cans of beer per week  . Drug Use: No  . Sexually Active: Not on file   Other Topics Concern  . Not on file   Social History Narrative   Single, 2 daughters, exercise - raquetball and walking    Current Outpatient Prescriptions on File Prior to Visit  Medication Sig Dispense Refill  .  lisinopril-hydrochlorothiazide (PRINZIDE,ZESTORETIC) 10-12.5 MG per tablet Take 1 tablet by mouth daily.  30 tablet  1  . sildenafil (VIAGRA) 100 MG tablet Take 1 tablet (100 mg total) by mouth as needed for erectile dysfunction.  6 tablet  11   No current facility-administered medications on file prior to visit.    No Known Allergies   Review of Systems Constitutional: -fever, -chills, -sweats, -unexpected weight change, -decreased appetite, -fatigue Allergy: -sneezing, -itching, -congestion Dermatology: -changing moles, --rash, -lumps ENT: +runny nose, -ear pain, -sore throat, -hoarseness, -sinus pain, -teeth pain, - ringing in ears, -hearing loss, -nosebleeds Cardiology: -chest pain, -palpitations, -swelling, -difficulty  breathing when lying flat, -waking up short of breath Respiratory: +cough, -shortness of breath, -difficulty breathing with exercise or exertion, -wheezing, -coughing up blood Gastroenterology: -abdominal pain, -nausea, -vomiting, -diarrhea, -constipation, -blood in stool, -changes in bowel movement, -difficulty swallowing or eating Hematology: -bleeding, -bruising  Musculoskeletal: +joint aches, -muscle aches, -joint swelling, -back pain, -neck pain, -cramping, -changes in gait Ophthalmology: denies vision changes, eye redness, itching, discharge Urology: -burning with urination, -difficulty urinating, -blood in urine, -urinary frequency, -urgency, -incontinence Neurology: -headache, -weakness, -tingling, -numbness, -memory loss, -falls, -dizziness Psychology: -depressed mood, -agitation, -sleep problems     Objective:   Physical Exam  General appearance: alert, no distress, WD/WN, overweight black male  Skin: left neck with 3 large skin tags, right medial 3rd finger with wart, several benign appearing macules of face, and erythema and raised papular lesions of scalp suggestive of seborrheic dermatitis and similar inflamed lesion of neck suggestive of folliculitis barbae  HEENT: normocephalic, conjunctiva/corneas normal, sclerae anicteric, PERRLA, EOMi, nares patent, no discharge or erythema, pharynx normal  Oral cavity: MMM, tongue normal, teeth in good repair  Neck: supple, no lymphadenopathy, no thyromegaly, no masses, normal ROM, no bruits  Chest: non tender, normal shape and expansion  Heart: RRR, normal S1, S2, no murmurs  Lungs: CTA bilaterally, no wheezes, rhonchi, or rales  Abdomen: +bs, soft, +1.3cm umbilical hernia, reducible, otherwise non tender, non distended, no masses, no hepatomegaly, no splenomegaly, no bruits  Back: non tender, normal ROM, no scoliosis  Musculoskeletal:  Right knee with click and mild tenderness with Mcmurray, left shoulder with mild tenderness on empty  can and abduction over 90 degrees, otherwise upper extremities non tender, no obvious deformity, normal ROM throughout, lower extremities non tender, no obvious deformity, normal ROM throughout  Extremities: no edema, no cyanosis, no clubbing  Pulses: 2+ symmetric, upper and lower extremities, normal cap refill  Neurological: alert, oriented x 3, CN2-12 intact, strength normal upper extremities and lower extremities, sensation normal throughout, DTRs 2+ throughout, no cerebellar signs, gait normal  Psychiatric: normal affect, behavior normal, pleasant  GU: normal male external genitalia, uncircumcised, nontender, no masses, no hernia, no lymphadenopathy  Rectal: anus normal, prostate WNL, no obvious hemorrhoids, occult neg stool    Assessment and Plan :      Encounter Diagnoses  Name Primary?  . Routine general medical examination at a health care facility Yes  . Essential hypertension, benign   . Hyperlipidemia   . Tobacco use disorder   . Erectile dysfunction   . Need for prophylactic vaccination against Streptococcus pneumoniae (pneumococcus)   . Impaired fasting blood sugar   . Skin moles   . Screen for STD (sexually transmitted disease)   . Fatigue   . Low libido   . Knee pain, acute, right   . Pain in joint, shoulder region, left     Physical exam -  discussed healthy lifestyle, diet, exercise, preventative care, vaccinations, and addressed their concerns.    HTN - controled on current medication  Hyperlipidemia - never started medication last year.   Recheck levels today.   Tobacco use - discussed risks, advised to quit tobacco.  He doesn't seem very motivated to quit.  ED - does fine on Viagra, no issue, aware of risks/benefits.  Pneumococcal vaccine, VIS and counseling given  Impaired fasting glucose - labs today  Skin lesions - referral to dermatology, lost to f/u last year  STD screening, discussed safe sex  Fatigue, low libido - TST level at his request,  advised regular exercise,healthy diet  Knee pain - possible inflammation vs meniscal problem.  Mechanism of injury and exam may suggest meniscus problem, but will start conservatively with NSAIDs as below script and PT exercises we discussed, ice, relative rest.  Recheck 2-3 wk.  Shoulder pain - NSAIDs as below, discussed PT exercises to do at home.  Recheck 2-3 wk.  Follow-up 2-3 wk, pending labs

## 2012-11-28 NOTE — Patient Instructions (Addendum)
Hypertension -  Continue your medication as usual  Consider taking 81mg  Aspirin daily for heart health.  Continue Viagra as needed, max 1 per day.  We will call with lab results and plan.   See eye doctor yearly, dentist twice yearly.  Work on the shoulder and knee exercises.  Use ice and heat.   You can use a knee sleeve OTC for support.  Begin Diclofenac twice daily for pain and inflammation.  STOP TOBACCO  YOU CAN QUIT SMOKING!  Talk to your medical provider about using medicines to help you quit. These include nicotine replacement gum, lozenges, or skin patches.  Consider calling 1-800-QUIT-NOW, a toll free 24/7 hotline with free counseling to help you quit.  If you are ready to quit smoking or are thinking about it, congratulations! You have chosen to help yourself be healthier and live longer! There are lots of different ways to quit smoking. Nicotine gum, nicotine patches, a nicotine inhaler, or nicotine nasal spray can help with physical craving. Hypnosis, support groups, and medicines help break the habit of smoking. TIPS TO GET OFF AND STAY OFF CIGARETTES  Learn to predict your moods. Do not let a bad situation be your excuse to have a cigarette. Some situations in your life might tempt you to have a cigarette.   Ask friends and co-workers not to smoke around you.   Make your home smoke-free.   Never have "just one" cigarette. It leads to wanting another and another. Remind yourself of your decision to quit.   On a card, make a list of your reasons for not smoking. Read it at least the same number of times a day as you have a cigarette. Tell yourself everyday, "I do not want to smoke. I choose not to smoke."   Ask someone at home or work to help you with your plan to quit smoking.   Have something planned after you eat or have a cup of coffee. Take a walk or get other exercise to perk you up. This will help to keep you from overeating.   Try a relaxation exercise to calm  you down and decrease your stress. Remember, you may be tense and nervous the first two weeks after you quit. This will pass.   Find new activities to keep your hands busy. Play with a pen, coin, or rubber band. Doodle or draw things on paper.   Brush your teeth right after eating. This will help cut down the craving for the taste of tobacco after meals. You can try mouthwash too.   Try gum, breath mints, or diet candy to keep something in your mouth.  IF YOU SMOKE AND WANT TO QUIT:  Do not stock up on cigarettes. Never buy a carton. Wait until one pack is finished before you buy another.   Never carry cigarettes with you at work or at home.   Keep cigarettes as far away from you as possible. Leave them with someone else.   Never carry matches or a lighter with you.   Ask yourself, "Do I need this cigarette or is this just a reflex?"   Bet with someone that you can quit. Put cigarette money in a piggy bank every morning. If you smoke, you give up the money. If you do not smoke, by the end of the week, you keep the money.   Keep trying. It takes 21 days to change a habit!  Document Released: 05/28/2009 Document Revised: 04/13/2011 Document Reviewed: 05/28/2009 Exit[employer] Patient  Information 2012 [place], [place].

## 2012-11-29 ENCOUNTER — Encounter: Payer: Self-pay | Admitting: Medical

## 2012-11-29 LAB — GC/CHLAMYDIA PROBE AMP
CT Probe RNA: NEGATIVE
GC Probe RNA: NEGATIVE

## 2012-11-29 LAB — HIV ANTIBODY (ROUTINE TESTING W REFLEX): HIV: NONREACTIVE

## 2012-11-29 LAB — SYPHILIS: RPR W/REFLEX TO RPR TITER AND TREPONEMAL ANTIBODIES, TRADITIONAL SCREENING AND DIAGNOSIS ALGORITHM

## 2012-11-29 LAB — TESTOSTERONE: Testosterone: 510 ng/dL (ref 300–890)

## 2012-11-29 LAB — HSV 2 ANTIBODY, IGG: HSV 2 Glycoprotein G Ab, IgG: 5.87 IV — ABNORMAL HIGH

## 2012-12-07 ENCOUNTER — Other Ambulatory Visit: Payer: Self-pay | Admitting: Medical

## 2012-12-07 ENCOUNTER — Telehealth: Payer: Self-pay | Admitting: Medical

## 2012-12-07 MED ORDER — ATORVASTATIN CALCIUM 20 MG PO TABS: 20.0000 mg | ORAL_TABLET | Freq: Every day | ORAL | Status: DC

## 2012-12-07 NOTE — Telephone Encounter (Signed)
Patient is aware of the Lipitor sent to his pharmacy. CLS

## 2012-12-07 NOTE — Telephone Encounter (Signed)
Lipitor resent.  F/u in 6wk after starting medication, fasting.

## 2012-12-07 NOTE — Telephone Encounter (Signed)
Please, send Lipitor to his pharmacy. CLS

## 2012-12-07 NOTE — Telephone Encounter (Signed)
Patient states he spoke to someone concerning his labs and they were going to send in Rx for lipitor.  I do not see lipitor Rx in system, please double check if Rx was sent in and notify patient        [facility] and [address]

## 2012-12-21 ENCOUNTER — Ambulatory Visit: Payer: Self-pay | Admitting: Medical

## 2012-12-26 ENCOUNTER — Telehealth: Payer: Self-pay | Admitting: Medical

## 2012-12-26 MED ORDER — LISINOPRIL-HYDROCHLOROTHIAZIDE 10-12.5 MG PO TABS: 1.0000 | ORAL_TABLET | Freq: Every day | ORAL | Status: DC

## 2012-12-26 NOTE — Telephone Encounter (Signed)
Medication refills sent to the pharmacy. CLS

## 2013-01-16 ENCOUNTER — Other Ambulatory Visit: Payer: Self-pay | Admitting: Family Medicine

## 2013-01-21 ENCOUNTER — Ambulatory Visit: Payer: Self-pay | Admitting: Medical

## 2013-02-11 ENCOUNTER — Ambulatory Visit: Payer: Self-pay | Admitting: Medical

## 2013-07-26 ENCOUNTER — Other Ambulatory Visit: Payer: Self-pay | Admitting: Family Medicine

## 2013-07-29 NOTE — Telephone Encounter (Signed)
IS THIS OKAY 

## 2013-09-06 ENCOUNTER — Encounter: Payer: Self-pay | Admitting: Family Medicine

## 2013-09-06 ENCOUNTER — Ambulatory Visit (INDEPENDENT_AMBULATORY_CARE_PROVIDER_SITE_OTHER): Payer: BC Managed Care – PPO | Admitting: Family Medicine

## 2013-09-06 VITALS — BP 146/90 | HR 84 | Temp 98.9°F | Ht 71.0 in | Wt 235.0 lb

## 2013-09-06 DIAGNOSIS — R52 Pain, unspecified: Secondary | ICD-10-CM

## 2013-09-06 DIAGNOSIS — F17200 Nicotine dependence, unspecified, uncomplicated: Secondary | ICD-10-CM

## 2013-09-06 DIAGNOSIS — J069 Acute upper respiratory infection, unspecified: Secondary | ICD-10-CM

## 2013-09-06 DIAGNOSIS — R509 Fever, unspecified: Secondary | ICD-10-CM

## 2013-09-06 LAB — POC INFLUENZA A&B (BINAX/QUICKVUE)
INFLUENZA A, POC: NEGATIVE
Influenza B, POC: NEGATIVE

## 2013-09-06 NOTE — Progress Notes (Signed)
Chief Complaint  Patient presents with  . flu    fever, congestion, sore throat, phelgm, coughing, body chills, came on tuesday   3 days ago he started with tactile fever (sweats at night) and congestion. He has been using Nyquil which helps him sleep, and helps a little with congestion.  He has also used Dayquil for the last 2 days with some improvement.  He hasn't taken any OTC meds yet today.  Nasal mucus is whitish-yellow.  He has some PND, after shower this morning the drainage was green and bloody.  He has a mild cough, and phlegm is light green.  Symptoms haven't gotten much worse, but hasn't improved any.  Denies any significant myalgias, just very mild.  No close sick contacts.  +smoker, smokes about 1PPD.  He has been thinking about quitting, seems ready.  Past Medical History  Diagnosis Date  . Hyperlipidemia   . Hypertension   . ED (erectile dysfunction)   . Allergy     RHINITS  . Diverticulosis   . Impaired fasting glucose   . Substance abuse     TOBACCO (SMOKER)  . Smoker   . History of cardiovascular stress test 03/20/2008    dobutamine stress test, normal study, Dr. Verlon Setting  . H/O echocardiogram 03/20/08    mild - moderate LVH, EF 50-55%, mild RV enlargement, mild mitral regurgitation, Dr. Verlon Setting  . Chronic shoulder pain     left  . History of neck surgery    Past Surgical History  Procedure Laterality Date  . Spine surgery  2011(JENKINS,MD)    CERVICAL DISC AND FUSION  . Colonoscopy  10/2010    diverticulosis, othewrise normal, repeat 10 years, Dr. Henrene Pastor  . Cdisc & fusion  2011    Dr. Arnoldo Morale   History   Social History  . Marital Status: Single    Spouse Name: N/A    Number of Children: N/A  . Years of Education: N/A   Occupational History  . Clinical biochemist    Social History Main Topics  . Smoking status: Current Every Day Smoker -- 0.50 packs/day for 15 years    Types: Cigarettes  . Smokeless tobacco: Not on file  . Alcohol Use: 3.0 oz/week     5 Cans of beer per week  . Drug Use: No  . Sexual Activity: Not on file   Other Topics Concern  . Not on file   Social History Narrative   Single, 2 daughters, exercise - raquetball and walking, works in [place] Prescriptions as of 09/06/2013  Medication Sig  . atorvastatin (LIPITOR) 20 MG tablet Take 1 tablet (20 mg total) by mouth daily.  . diclofenac (VOLTAREN) 75 MG EC tablet Take 1 tablet (75 mg total) by mouth 2 (two) times daily.  Marland Kitchen lisinopril-hydrochlorothiazide (PRINZIDE,ZESTORETIC) 10-12.5 MG per tablet Take 1 tablet by mouth daily.  Marland Kitchen lisinopril-hydrochlorothiazide (PRINZIDE,ZESTORETIC) 10-12.5 MG per tablet TAKE 1 TABLET BY MOUTH EVERY DAY  . VIAGRA 100 MG tablet TAKE 1 TABLET BY MOUTH AS NEEDED FOR ERECTILE DYSFUNCTION   No Known Allergies  ROS:  Denies dizziness, nausea, vomiting, diarrhea, rashes, joint pains, shortness of breath.  +wart on finger.  See HPI  PHYSICAL EXAM: BP 146/90  Pulse 84  Temp(Src) 98.9 F (37.2 C) (Oral)  Ht 5\' 11"  (1.803 m)  Wt 235 lb (106.595 kg)  BMI 32.79 kg/m2 Well developed male, very congested with rare cough, in no distress HEENT:  PERRL, EOMI, conjunctiva clear.  TM's and EAC's normal. Nasal mucosa moderately edematous, no erythema, no purulence.  OP is clear. Mild tenderness over frontal sinuses, nontender over maxillary. Neck: no lymphadenopathy or mass Heart: regular rate and rhythm without murmur Lungs: clear bilaterally Skin: no rash Neuro: alert and oriented.  Cranial nerves intact. Normal strength, gait Psych: normal mood, affect   Influenza test negative  ASSESSMENT/PLAN:  Acute upper respiratory infections of unspecified site  Tobacco use disorder  Fever, unspecified - Plan: POC Influenza A&B  Body aches - Plan: POC Influenza A&B  Supportive measures. Counseled re: smoking cessation  Drink plenty of fluids. Avoid decongestants as these are raising your blood pressure. Instead, use  coricidin HBP, or other antihistamine such as claritin or zyrtec to help dry the drainage. Use Mucinex (guaifenesin, expectorant, also found in Robitussin products) to help thin the mucus. Use sinus rinses or neti-pot to help clear the sinuses Use dextromethorphan if needed for cough suppression (DM versions of robitussin and mucinex, or Delsym syrup).  Call or return if symptoms persist or worsen. Expect to see some improvement in the next 3-5 days. Return if shortness of breath, or new symptoms develop for re-evaluation.   Return for treatment of wart

## 2013-09-06 NOTE — Patient Instructions (Signed)
Please try and quit smoking--start thinking about why/when you smoke (habit, boredom, stress) in order to come up with effective strategies to cut back or quit. Available resources to help you quit include free counseling through [facility] ([facility]Quitline.[facility] or 1-800-QUITNOW), smoking cessation classes through [facility] (call to find out schedule), over-the-counter nicotine replacements, and e-cigarettes (although this may not help break the hand-mouth habit).  Many insurance companies also have smoking cessation programs (which may decrease the cost of patches, meds if enrolled).  If these methods are not effective for you, and you are motivated to quit, return to discuss the possibility of prescription medications.  Drink plenty of fluids. Avoid decongestants as these are raising your blood pressure. Instead, use coricidin HBP, or other antihistamine such as claritin or zyrtec to help dry the drainage. Use Mucinex (guaifenesin, expectorant, also found in Robitussin products) to help thin the mucus. Use sinus rinses or neti-pot to help clear the sinuses Use dextromethorphan if needed for cough suppression (DM versions of robitussin and mucinex, or Delsym syrup).  Call or return if symptoms persist or worsen. Expect to see some improvement in the next 3-5 days. Return if shortness of breath, or new symptoms develop for re-evaluation.

## 2013-09-20 ENCOUNTER — Other Ambulatory Visit: Payer: Self-pay | Admitting: Medical

## 2013-09-25 ENCOUNTER — Encounter: Payer: Self-pay | Admitting: Medical

## 2013-09-25 ENCOUNTER — Ambulatory Visit (INDEPENDENT_AMBULATORY_CARE_PROVIDER_SITE_OTHER): Payer: BC Managed Care – PPO | Admitting: Medical

## 2013-09-25 VITALS — BP 140/90 | HR 68 | Temp 98.2°F | Resp 18 | Wt 234.0 lb

## 2013-09-25 DIAGNOSIS — F17200 Nicotine dependence, unspecified, uncomplicated: Secondary | ICD-10-CM

## 2013-09-25 DIAGNOSIS — E785 Hyperlipidemia, unspecified: Secondary | ICD-10-CM

## 2013-09-25 DIAGNOSIS — E663 Overweight: Secondary | ICD-10-CM

## 2013-09-25 DIAGNOSIS — L989 Disorder of the skin and subcutaneous tissue, unspecified: Secondary | ICD-10-CM

## 2013-09-25 DIAGNOSIS — I10 Essential (primary) hypertension: Secondary | ICD-10-CM

## 2013-09-25 DIAGNOSIS — R7301 Impaired fasting glucose: Secondary | ICD-10-CM

## 2013-09-25 LAB — HEMOGLOBIN A1C
Hgb A1c MFr Bld: 5.9 % — ABNORMAL HIGH (ref <5.7)
Mean Plasma Glucose: 123 mg/dL — ABNORMAL HIGH (ref <117)

## 2013-09-25 MED ORDER — LISINOPRIL-HYDROCHLOROTHIAZIDE 10-12.5 MG PO TABS: 1.0000 | ORAL_TABLET | Freq: Every day | ORAL | Status: DC

## 2013-09-25 MED ORDER — ATORVASTATIN CALCIUM 20 MG PO TABS: 20.0000 mg | ORAL_TABLET | Freq: Every day | ORAL | Status: DC

## 2013-09-25 NOTE — Progress Notes (Signed)
   Subjective:   James Bird is a 53 y.o. male presenting on 09/25/2013 with fasting medication check  Essential hypertension, benign Not checking BP, but compliant with medication without c/o.  Hyperlipidemia Trying to eat healthy although he had days when he uses no diet discretion.  Had country ham and fried foods yesterday.  Compliant with medication.  Overweight Not currently exercising.  Says he can do better in regard to healthy diet.  still smoking.    Has several skin concerns.   No other complaint.  Review of Systems ROS as in subjective      Objective:    Filed Vitals:   09/25/13 0932  BP: 140/90  Pulse: 68  Temp: 98.2 F (36.8 C)  Resp: 18    General appearance: alert, no distress, WD/WN Skin: right 3rd finger, lateral surface of middle phalanx with 52mm x 67mm raised verrucal lesion, 2 dark but inflamed appearing ski tags of left lateral neck Neck: supple, no lymphadenopathy, no thyromegaly, no masses Heart: RRR, normal S1, S2, no murmurs Lungs: CTA bilaterally, no wheezes, rhonchi, or rales Abdomen: +bs, soft, non tender, non distended, no masses, no hepatomegaly, no splenomegaly Pulses: 2+ symmetric, upper and lower extremities, normal cap refill Ext: no edema      Assessment: Encounter Diagnoses  Name Primary?  . Essential hypertension, benign Yes  . Hyperlipidemia   . Impaired fasting blood sugar   . Tobacco use disorder   . Skin lesion      Plan: 1. Essential hypertension, benign C/t current medication.  BP a little higher today than usual.  C/t to monitor.  Labs today.  Advised healthy diet, exericse, advised weight loss, smoking cessation. - Comprehensive metabolic panel  2. Hyperlipidemia C/t same medication, compliant - Lipid panel  3. Impaired fasting blood sugar - Hemoglobin A1c  4. Tobacco use disorder Advised he stop tobacco.  5. Skin lesion Return at his convenience for Verucca Freeze therapy to wart of finger  and excision of left neck skin tags.  Return pending labs.

## 2013-09-25 NOTE — Assessment & Plan Note (Signed)
Trying to eat healthy although he had days when he uses no diet discretion.  Had country ham and fried foods yesterday.  Compliant with medication.

## 2013-09-25 NOTE — Assessment & Plan Note (Signed)
Not currently exercising.  Says he can do better in regard to healthy diet.

## 2013-09-25 NOTE — Assessment & Plan Note (Signed)
Not checking BP, but compliant with medication without c/o.

## 2013-09-26 LAB — COMPREHENSIVE METABOLIC PANEL
ALBUMIN: 4.1 g/dL (ref 3.5–5.2)
ALK PHOS: 53 U/L (ref 39–117)
ALT: 32 U/L (ref 0–53)
AST: 23 U/L (ref 0–37)
BUN: 13 mg/dL (ref 6–23)
CALCIUM: 9.4 mg/dL (ref 8.4–10.5)
CHLORIDE: 105 meq/L (ref 96–112)
CO2: 30 mEq/L (ref 19–32)
Creat: 0.95 mg/dL (ref 0.50–1.35)
Glucose, Bld: 95 mg/dL (ref 70–99)
Potassium: 4.1 mEq/L (ref 3.5–5.3)
SODIUM: 142 meq/L (ref 135–145)
Total Bilirubin: 0.8 mg/dL (ref 0.2–1.2)
Total Protein: 6.6 g/dL (ref 6.0–8.3)

## 2013-09-26 LAB — LIPID PANEL
Cholesterol: 157 mg/dL (ref 0–200)
HDL: 47 mg/dL
LDL Cholesterol: 77 mg/dL (ref 0–99)
Total CHOL/HDL Ratio: 3.3 ratio
Triglycerides: 163 mg/dL — ABNORMAL HIGH
VLDL: 33 mg/dL (ref 0–40)

## 2013-12-12 ENCOUNTER — Encounter: Payer: Self-pay | Admitting: Family Medicine

## 2013-12-12 ENCOUNTER — Ambulatory Visit (INDEPENDENT_AMBULATORY_CARE_PROVIDER_SITE_OTHER): Payer: BC Managed Care – PPO | Admitting: Family Medicine

## 2013-12-12 VITALS — BP 140/90 | HR 88 | Ht 71.75 in | Wt 235.0 lb

## 2013-12-12 DIAGNOSIS — R7301 Impaired fasting glucose: Secondary | ICD-10-CM

## 2013-12-12 DIAGNOSIS — R202 Paresthesia of skin: Secondary | ICD-10-CM

## 2013-12-12 DIAGNOSIS — R209 Unspecified disturbances of skin sensation: Secondary | ICD-10-CM

## 2013-12-12 DIAGNOSIS — F1490 Cocaine use, unspecified, uncomplicated: Secondary | ICD-10-CM

## 2013-12-12 DIAGNOSIS — I10 Essential (primary) hypertension: Secondary | ICD-10-CM

## 2013-12-12 DIAGNOSIS — F17200 Nicotine dependence, unspecified, uncomplicated: Secondary | ICD-10-CM

## 2013-12-12 DIAGNOSIS — F1410 Cocaine abuse, uncomplicated: Secondary | ICD-10-CM

## 2013-12-12 MED ORDER — NAPROXEN 500 MG PO TABS: 500.0000 mg | ORAL_TABLET | Freq: Two times a day (BID) | ORAL | Status: DC

## 2013-12-12 NOTE — Patient Instructions (Addendum)
Please stop any recreational drug use, and try to limit alcohol to no more than 2 drinks at a time.  Take the naproxen twice daily with food.  Do not use any other pain medications, just tylenol if needed.  Take it regularly twice daily until you are either completely better, or for the full 15 days.  Return in 7-10 days if not improving  Follow a low sodium diet; try and get exercise daily and lose weight (inches at the waist).  Keep a list of your blood pressure, and bring the list to a follow-up visit in 2-3 months.  Return sooner if BP's are consistently >150-160/100, otherwise, let's give these changes some time to take effect.  If BP's remain >140/90 consistently, then dosage of medication can be changed.  Sodium-Controlled Diet Sodium is a mineral. It is found in many foods. Sodium may be found naturally or added during the making of a food. The most common form of sodium is salt, which is made up of sodium and chloride. Reducing your sodium intake involves changing your eating habits. The following guidelines will help you reduce the sodium in your diet:  Stop using the salt shaker.  Use salt sparingly in cooking and baking.  Substitute with sodium-free seasonings and spices.  Do not use a salt substitute (potassium chloride) without your caregiver's permission.  Include a variety of fresh, unprocessed foods in your diet.  Limit the use of processed and convenience foods that are high in sodium. USE THE FOLLOWING FOODS SPARINGLY: Breads/Starches  Commercial bread stuffing, commercial pancake or waffle mixes, coating mixes. Waffles. Croutons. Prepared (boxed or frozen) potato, rice, or noodle mixes that contain salt or sodium. Salted [place] fries or hash browns. Salted popcorn, breads, crackers, chips, or snack foods. Vegetables  Vegetables canned with salt or prepared in cream, butter, or cheese sauces. Sauerkraut. Tomato or vegetable juices canned with salt.  Fresh vegetables  are allowed if rinsed thoroughly. Fruit  Fruit is okay to eat. Meat and Meat Substitutes  Salted or smoked meats, such as bacon or Canadian bacon, chipped or corned beef, hot dogs, salt pork, luncheon meats, pastrami, ham, or sausage. Canned or smoked fish, poultry, or meat. Processed cheese or cheese spreads, blue or Roquefort cheese. Battered or frozen fish products. Prepared spaghetti sauce. Baked beans. Reuben sandwiches. Salted nuts. Caviar. Milk  Limit buttermilk to 1 cup per week. Soups and Combination Foods  Bouillon cubes, canned or dried soups, broth, consomm. Convenience (frozen or packaged) dinners with more than 600 mg sodium. Pot pies, pizza, Asian food, fast food cheeseburgers, and specialty sandwiches. Desserts and Sweets  Regular (salted) desserts, pie, commercial fruit snack pies, commercial snack cakes, canned puddings.  Eat desserts and sweets in moderation. Fats and Oils  Gravy mixes or canned gravy. No more than 1 to 2 tbs of salad dressing. Chip dips.  Eat fats and oils in moderation. Beverages  See those listed under the vegetables and milk groups. Condiments  Ketchup, mustard, meat sauces, salsa, regular (salted) and lite soy sauce or mustard. Dill pickles, olives, meat tenderizer. Prepared horseradish or pickle relish. Dutch-processed cocoa. Baking powder or baking soda used medicinally. Worcestershire sauce. "Light" salt. Salt substitute, unless approved by your caregiver. Document Released: 01/21/2002 Document Revised: 10/24/2011 Document Reviewed: 08/24/2009 [facility] Patient Information 2014 [place], [place].

## 2013-12-12 NOTE — Progress Notes (Signed)
Chief Complaint  Patient presents with  . Leg Pain    left leg pain and numbness. Starts in his thigh area and run down leg, feels the worst at his foot. Tingling and numbness-started last night. (did not take lipitor or lisinopril today)   Yesterday, towards the end of work, he noticed numbness, tingling in the left great toe, extending up into the foot.  Intermittently he has some tingling in the calf.  This morning he thought more of the leg (posterior thigh) was involved, but now it is more limited to the foot and great toe.  He denies any back pain (some chronic issues, no new problems, not currently in pain).  He denies any weakness in the left leg.  He does admit some weakness on the right foot related to his prior neck surgery.  Denies any numbness or weakness or pain into arms/hands.  No known trauma or injury.  No change in shoes.  He denies any h/o gout, but wonders if he might be drinking too much and it could be gout.  He denies any pain in the toe. He also admits to some recreational cocaine use, last used about 2 weeks ago, along with admitting excess alcohol use.  Once in the past he had similar numbness, when he fell asleep on couch with his feet up, but it resolved with position change.  This, however, has persisted since yesterday.  It is very uncomfortable for him to put weight on his foot due to the tingling, and is also hard to move the great toe because of the sensation.  Denies any swelling, redness, fever.  He reports that his BP's at home are sometimes running high.  He didn't take his BP med this morning.  Denies headache.  Past Medical History  Diagnosis Date  . Hyperlipidemia   . Hypertension   . ED (erectile dysfunction)   . Allergy     RHINITS  . Diverticulosis   . Impaired fasting glucose   . Substance abuse     TOBACCO (SMOKER)  . Smoker   . History of cardiovascular stress test 03/20/2008    dobutamine stress test, normal study, Dr. Verlon Setting  . H/O  echocardiogram 03/20/08    mild - moderate LVH, EF 50-55%, mild RV enlargement, mild mitral regurgitation, Dr. Verlon Setting  . Chronic shoulder pain     left  . History of neck surgery    Past Surgical History  Procedure Laterality Date  . Spine surgery  2011(JENKINS,MD)    CERVICAL DISC AND FUSION  . Colonoscopy  10/2010    diverticulosis, othewrise normal, repeat 10 years, Dr. Henrene Pastor  . Cdisc & fusion  2011    Dr. Arnoldo Morale   History   Social History  . Marital Status: Single    Spouse Name: N/A    Number of Children: N/A  . Years of Education: N/A   Occupational History  . Clinical biochemist    Social History Main Topics  . Smoking status: Current Every Day Smoker -- 0.50 packs/day for 15 years    Types: Cigarettes  . Smokeless tobacco: Not on file  . Alcohol Use: 3.0 oz/week    5 Cans of beer per week     Comment: 10 drinks per week, sometimes more  . Drug Use: Yes     Comment: occasional recreational cocaine use (noted 11/2013)  . Sexual Activity: Not on file   Other Topics Concern  . Not on file   Social History Narrative  Single, 2 daughters, exercise - raquetball and walking, works in [employer] Encounter Prescriptions as of 12/12/2013  Medication Sig Note  . atorvastatin (LIPITOR) 20 MG tablet Take 1 tablet (20 mg total) by mouth daily at 6 PM.   . lisinopril-hydrochlorothiazide (PRINZIDE,ZESTORETIC) 10-12.5 MG per tablet Take 1 tablet by mouth daily. 12/12/2013: Has not taken it yet today  . VIAGRA 100 MG tablet TAKE 1 TABLET BY MOUTH AS NEEDED FOR ERECTILE DYSFUNCTION    No Known Allergies  ROS:  Denies headaches, chest pain, shortness of breath, GI complaints.  Denies back pain, dysuria.  Denies URI symptoms, bleeding, bruising, rashes.  See HPI  PHYSICAL EXAM: BP 140/90  Pulse 88  Ht 5' 11.75" (1.822 m)  Wt 235 lb (106.595 kg)  BMI 32.11 kg/m2 Pleasant, overweight male in no distress HEENT:  PERRL, EOMI, conjunctiva clear.  OP clear Neck: no  lymphadenopathy or mass Heart: regular rate and rhythm Lungs: clear bilaterally Back: no spinal tenderness, CVA tenderness, SI tenderness or muscle spasm Extremities:  2+ pulses bilaterally. Skin is intact, slightly dry on the bottom Some onychomycotic toenails bilaterally. No erythema, warmth or soft tissue swelling He is extremely sensitive to the touch, as it causes increased tingling which is uncomfortable.  He has diffuse difference in sensation across the entire foot, but more pronounced starting at the medial ankle, and medial foot, especially at left great toe.  Intact to light touch and temperature. Decreased plantar flexion (?weakness vs unable due to discomfort, afraid to try)  Review of chart shows MRI of lumbar spine done 12/2011 IMPRESSION:  Degenerative changes throughout the lumbar spine most prominent on  the left at the L1-2 level as detailed above.  In this patient who is presenting with right-sided symptoms,  lateral extension of disc at L2-3 through L5-S1 level approaches or  touching the exiting nerve root which does not appear to be  significantly compressed as detailed above.   Lab Results  Component Value Date   HGBA1C 5.9* 09/25/2013   ASSESSMENT/PLAN:  Paresthesia of left foot - ? etiology?  possible tarsal tunnel vs other peripheral neuropathy. not likely due to back or diabetes.  NSAID trial.  risks reviewed. - Plan: naproxen (NAPROSYN) 500 MG tablet  Essential hypertension, benign - borderline today, hasn't taken med.  low sodium diet, exercise, wt loss; no binge drinking or cocaine; monitor and f/u 2-3 mos with list.   Impaired fasting blood sugar - daily exercise and weight loss encouraged.  cut back on alcohol  Tobacco use disorder - encouraged cessation.    Cocaine use - encouraged cessation--risks reviewed  Take naprosyn twice daily until symptoms completely resolve.  Return in 7-10 days if not improving.  Risks/side effects reviewed/NSAID  precautions.  Follow a low sodium diet; try and get exercise daily and lose weight (inches at the waist).  Keep a list of your blood pressure, and bring the list to a follow-up visit in 2-3 months.  Return sooner if BP's are consistently >150-160/100, otherwise, let's give these changes some time to take effect.  If BP's remain >140/90 consistently, then dosage of medication can be changed.  Please stop any recreational drug use, and try to limit alcohol to no more than 2 drinks at a time.

## 2013-12-23 ENCOUNTER — Ambulatory Visit: Payer: Self-pay | Admitting: Medical

## 2014-05-29 ENCOUNTER — Encounter: Payer: Self-pay | Admitting: Internal Medicine

## 2014-10-19 ENCOUNTER — Other Ambulatory Visit: Payer: Self-pay | Admitting: Medical

## 2014-11-18 ENCOUNTER — Other Ambulatory Visit: Payer: Self-pay | Admitting: Medical

## 2014-12-11 ENCOUNTER — Ambulatory Visit (INDEPENDENT_AMBULATORY_CARE_PROVIDER_SITE_OTHER): Payer: BC Managed Care – PPO | Admitting: Medical

## 2014-12-11 ENCOUNTER — Encounter: Payer: Self-pay | Admitting: Medical

## 2014-12-11 VITALS — BP 120/70 | HR 78 | Temp 98.0°F | Resp 16 | Ht 72.0 in | Wt 228.0 lb

## 2014-12-11 DIAGNOSIS — B079 Viral wart, unspecified: Secondary | ICD-10-CM | POA: Diagnosis not present

## 2014-12-11 DIAGNOSIS — F1910 Other psychoactive substance abuse, uncomplicated: Secondary | ICD-10-CM

## 2014-12-11 DIAGNOSIS — E669 Obesity, unspecified: Secondary | ICD-10-CM

## 2014-12-11 DIAGNOSIS — R7301 Impaired fasting glucose: Secondary | ICD-10-CM | POA: Diagnosis not present

## 2014-12-11 DIAGNOSIS — F17200 Nicotine dependence, unspecified, uncomplicated: Secondary | ICD-10-CM

## 2014-12-11 DIAGNOSIS — N528 Other male erectile dysfunction: Secondary | ICD-10-CM

## 2014-12-11 DIAGNOSIS — I10 Essential (primary) hypertension: Secondary | ICD-10-CM | POA: Insufficient documentation

## 2014-12-11 DIAGNOSIS — E785 Hyperlipidemia, unspecified: Secondary | ICD-10-CM

## 2014-12-11 DIAGNOSIS — Z720 Tobacco use: Secondary | ICD-10-CM

## 2014-12-11 DIAGNOSIS — Z113 Encounter for screening for infections with a predominantly sexual mode of transmission: Secondary | ICD-10-CM | POA: Diagnosis not present

## 2014-12-11 DIAGNOSIS — Z0000 Encounter for general adult medical examination without abnormal findings: Secondary | ICD-10-CM | POA: Diagnosis not present

## 2014-12-11 LAB — CBC
HEMATOCRIT: 46.6 % (ref 39.0–52.0)
HEMOGLOBIN: 16 g/dL (ref 13.0–17.0)
MCH: 30.4 pg (ref 26.0–34.0)
MCHC: 34.3 g/dL (ref 30.0–36.0)
MCV: 88.4 fL (ref 78.0–100.0)
MPV: 10.9 fL (ref 8.6–12.4)
PLATELETS: 247 10*3/uL (ref 150–400)
RBC: 5.27 MIL/uL (ref 4.22–5.81)
RDW: 14.1 % (ref 11.5–15.5)
WBC: 7.9 10*3/uL (ref 4.0–10.5)

## 2014-12-11 LAB — COMPREHENSIVE METABOLIC PANEL
ALK PHOS: 52 U/L (ref 39–117)
ALT: 18 U/L (ref 0–53)
AST: 19 U/L (ref 0–37)
Albumin: 4.3 g/dL (ref 3.5–5.2)
BUN: 14 mg/dL (ref 6–23)
CO2: 27 meq/L (ref 19–32)
Calcium: 9.9 mg/dL (ref 8.4–10.5)
Chloride: 103 mEq/L (ref 96–112)
Creat: 1.04 mg/dL (ref 0.50–1.35)
Glucose, Bld: 88 mg/dL (ref 70–99)
POTASSIUM: 3.9 meq/L (ref 3.5–5.3)
Sodium: 141 mEq/L (ref 135–145)
Total Bilirubin: 1.1 mg/dL (ref 0.2–1.2)
Total Protein: 7.4 g/dL (ref 6.0–8.3)

## 2014-12-11 LAB — POCT URINALYSIS DIPSTICK
Bilirubin, UA: NEGATIVE
Glucose, UA: NEGATIVE
Ketones, UA: NEGATIVE
Leukocytes, UA: NEGATIVE
NITRITE UA: NEGATIVE
Protein, UA: NEGATIVE
RBC UA: NEGATIVE
Urobilinogen, UA: NEGATIVE
pH, UA: 6

## 2014-12-11 LAB — LIPID PANEL
CHOL/HDL RATIO: 3.3 ratio
Cholesterol: 174 mg/dL (ref 0–200)
HDL: 52 mg/dL (ref >=40)
LDL Cholesterol: 94 mg/dL (ref 0–99)
Triglycerides: 140 mg/dL (ref <150)
VLDL: 28 mg/dL (ref 0–40)

## 2014-12-11 LAB — TSH: TSH: 1.295 u[IU]/mL (ref 0.350–4.500)

## 2014-12-11 LAB — HEMOGLOBIN A1C
Hgb A1c MFr Bld: 5.7 % — ABNORMAL HIGH (ref <5.7)
Mean Plasma Glucose: 117 mg/dL — ABNORMAL HIGH (ref <117)

## 2014-12-11 NOTE — Progress Notes (Signed)
Subjective:   HPI  James Bird is a 54 y.o. male who presents for a complete physical.  Medical care team/other doctors includes *Dr Newman Pies neurosurgeon for neck surgery 5 years ago   Preventative care:here 2015 Last physical or labs: 2015 Sees dentist yearly: Yes Dr Sabino Gasser Last tetanus vaccine, TD or Tdap: current   Concerns: Has wart on finger and has some recurrent neck pain and numbness down arm, also has cough occas  Reviewed their medical, surgical, family, social, medication, and allergy history and updated chart as appropriate.  Past Medical History  Diagnosis Date  . Hyperlipidemia   . Hypertension   . ED (erectile dysfunction)   . Allergy     RHINITS  . Diverticulosis   . Impaired fasting glucose   . Substance abuse     TOBACCO (SMOKER)  . Smoker   . History of cardiovascular stress test 03/20/2008    dobutamine stress test, normal study, Dr. Verlon Setting  . H/O echocardiogram 03/20/08    mild - moderate LVH, EF 50-55%, mild RV enlargement, mild mitral regurgitation, Dr. Verlon Setting  . Chronic shoulder pain     left  . History of neck surgery     Past Surgical History  Procedure Laterality Date  . Spine surgery  2011(JENKINS,MD)    CERVICAL DISC AND FUSION  . Colonoscopy  10/2010    diverticulosis, othewrise normal, repeat 10 years, Dr. Henrene Pastor  . Cdisc & fusion  2011    Dr. Arnoldo Morale    History   Social History  . Marital Status: Single    Spouse Name: N/A  . Number of Children: N/A  . Years of Education: N/A   Occupational History  . Clinical biochemist    Social History Main Topics  . Smoking status: Current Every Day Smoker -- 0.50 packs/day for 16 years    Types: Cigarettes  . Smokeless tobacco: Not on file  . Alcohol Use: 3.0 oz/week    5 Cans of beer per week     Comment: 10 drinks per week, sometimes more  . Drug Use: Yes     Comment: occasional recreational cocaine use (noted 11/2013)  . Sexual Activity: Not on file   Other Topics  Concern  . Not on file   Social History Narrative   Single, 2 daughters, exercise - raquetball and walking, works in IT at [employer].      Family History  Problem Relation Age of Onset  . Cancer Mother     LUNG  . Heart disease Father     MI  . Cancer Paternal Aunt   . Cancer Paternal Uncle   . Hypertension Brother   . Diabetes Neg Hx   . Stroke Neg Hx      Current outpatient prescriptions:  .  atorvastatin (LIPITOR) 20 MG tablet, TAKE 1 TABLET BY MOUTH EVERY DAY AT 6 PM, Disp: 30 tablet, Rfl: 0 .  lisinopril-hydrochlorothiazide (PRINZIDE,ZESTORETIC) 10-12.5 MG per tablet, TAKE 1 TABLET BY MOUTH EVERY DAY, Disp: 30 tablet, Rfl: 0 .  VIAGRA 100 MG tablet, TAKE 1 TABLET BY MOUTH AS NEEDED FOR ERECTILE DYSFUNCTION, Disp: 6 tablet, Rfl: 11 .  naproxen (NAPROSYN) 500 MG tablet, Take 1 tablet (500 mg total) by mouth 2 (two) times daily with a meal. (Patient not taking: Reported on 12/11/2014), Disp: 30 tablet, Rfl: 0  No Known Allergies   Review of Systems Constitutional: -fever, -chills, -sweats, -unexpected weight change, -decreased appetite, -fatigue Allergy: -sneezing, -itching, -congestion Dermatology: -changing moles, --  rash, -lumps ENT: -runny nose, -ear pain, -sore throat, -hoarseness, -sinus pain, -teeth pain, - ringing in ears, -hearing loss, -nosebleeds Cardiology: -chest pain, -palpitations, -swelling, -difficulty breathing when lying flat, -waking up short of breath Respiratory: +cough, -shortness of breath, -difficulty breathing with exercise or exertion, -wheezing, -coughing up blood Gastroenterology: -abdominal pain, -nausea, -vomiting, -diarrhea, -constipation, -blood in stool, -changes in bowel movement, -difficulty swallowing or eating Hematology: -bleeding, -bruising  Musculoskeletal: -joint aches, -muscle aches, -joint swelling, -back pain, +neck pain, -cramping, -changes in gait Ophthalmology: denies vision changes, eye redness, itching, discharge Urology:  -burning with urination, -difficulty urinating, -blood in urine, -urinary frequency, -urgency, -incontinence Neurology: -headache, -weakness, -tingling, +numbness, -memory loss, -falls, -dizziness Psychology: -depressed mood, -agitation, -sleep problems     Objective:   Physical Exam  BP 120/70 mmHg  Pulse 78  Temp(Src) 98 F (36.7 C) (Oral)  Resp 16  Ht 6' (1.829 m)  Wt 228 lb (103.42 kg)  BMI 30.92 kg/m2  Wt Readings from Last 3 Encounters:  12/11/14 228 lb (103.42 kg)  12/12/13 235 lb (106.595 kg)  09/25/13 234 lb (106.142 kg)   General appearance: alert, no distress, WD/WN, overweight black male  Skin: left neck with 3 large skin tags, right medial 3rd finger with 1cm wart, 2 raised crusted 2-30mm diameter brown lesions of upper back on the left, several benign appearing macules of face, and erythema and raised papular lesions of scalp suggestive of seborrheic dermatitis and similar inflamed lesion of neck suggestive of folliculitis barbae  HEENT: normocephalic, conjunctiva/corneas normal, sclerae anicteric, PERRLA, EOMi, nares patent, no discharge or erythema, pharynx normal  Oral cavity: MMM, tongue normal, teeth in good repair  Neck: supple, no lymphadenopathy, no thyromegaly, no masses, normal ROM, no bruits  Chest: non tender, normal shape and expansion  Heart: RRR, normal S1, S2, no murmurs  Lungs: CTA bilaterally, no wheezes, rhonchi, or rales  Abdomen: +bs, soft, +8.1XB umbilical hernia, reducible, otherwise non tender, non distended, no masses, no hepatomegaly, no splenomegaly, no bruits  Back: non tender, normal ROM, no scoliosis  Musculoskeletal: extremities non tender, no obvious deformity, normal ROM throughout Extremities: no edema, no cyanosis, no clubbing  Pulses: 2+ symmetric, upper and lower extremities, normal cap refill  Neurological: alert, oriented x 3, CN2-12 intact, strength normal upper extremities and lower extremities, sensation normal  throughout, DTRs 2+ throughout, no cerebellar signs, gait normal  Psychiatric: normal affect, behavior normal, pleasant  GU: normal male external genitalia, uncircumcised, nontender, no masses, no hernia, no lymphadenopathy  Rectal: anus normal, prostate WNL, no obvious hemorrhoids, occult neg stool    Adult ECG Report  Indication: HTN, hyperlipidemia, smoker, physical  Rate: 74 bpm  Rhythm: normal sinus rhythm  QRS Axis: 51 degrees  PR Interval: 164 ms  QRS Duration: 85ms  QTc: 472ms  Conduction Disturbances: none  Other Abnormalities: none  Patient's cardiac risk factors are: dyslipidemia, family history of premature cardiovascular disease, hypertension, male gender, obesity (BMI >= 30 kg/m2) and smoking/ tobacco exposure.  EKG comparison: none  Narrative Interpretation: normal EKG    Assessment and Plan :    Encounter Diagnoses  Name Primary?  . Encounter for health maintenance examination in adult Yes  . Essential hypertension   . Hyperlipidemia   . Impaired fasting blood sugar   . Obesity   . Other male erectile dysfunction   . Smoker   . Wart   . Substance abuse   . Screen for STD (sexually transmitted disease)     Physical  exam - discussed healthy lifestyle, diet, exercise, preventative care, vaccinations, and addressed their concerns.   See your dentist yearly for routine dental care including hygiene visits twice yearly. See your eye doctor yearly for routine vision care. Routine and STD labs today.  Vaccinations: Up to date on tdap, advised yearly flu vaccine, up to date on pneumococcal vaccine  Other concerns today: Counseled on the risks and dangers of substance abuse including smoking and cocaine. C/t current medications, labs today, discussed safe sex. Advised weight loss He will return for procedure vsit for right hand wart, 2 upper back lesions nad left neck lesions  Follow up pending labs

## 2014-12-12 ENCOUNTER — Other Ambulatory Visit: Payer: Self-pay | Admitting: Medical

## 2014-12-12 LAB — MICROALBUMIN / CREATININE URINE RATIO
CREATININE, URINE: 319.8 mg/dL
MICROALB UR: 0.9 mg/dL (ref <2.0)
Microalb Creat Ratio: 2.8 mg/g (ref 0.0–30.0)

## 2014-12-12 LAB — RPR

## 2014-12-12 LAB — HIV ANTIBODY (ROUTINE TESTING W REFLEX): HIV 1&2 Ab, 4th Generation: NONREACTIVE

## 2014-12-12 LAB — PSA: PSA: 2.36 ng/mL (ref <=4.00)

## 2014-12-12 MED ORDER — SILDENAFIL CITRATE 100 MG PO TABS: 100.0000 mg | ORAL_TABLET | ORAL | Status: DC | PRN

## 2014-12-12 MED ORDER — LISINOPRIL-HYDROCHLOROTHIAZIDE 10-12.5 MG PO TABS: 1.0000 | ORAL_TABLET | Freq: Every day | ORAL | Status: DC

## 2014-12-12 MED ORDER — ATORVASTATIN CALCIUM 20 MG PO TABS: 20.0000 mg | ORAL_TABLET | Freq: Every day | ORAL | Status: DC

## 2014-12-15 LAB — GC/CHLAMYDIA PROBE AMP
CT PROBE, AMP APTIMA: NEGATIVE
GC Probe RNA: NEGATIVE

## 2014-12-16 ENCOUNTER — Encounter: Payer: Self-pay | Admitting: Family Medicine

## 2015-01-13 ENCOUNTER — Ambulatory Visit: Payer: BC Managed Care – PPO | Admitting: Medical

## 2015-01-15 ENCOUNTER — Encounter: Payer: Self-pay | Admitting: Medical

## 2015-01-15 ENCOUNTER — Other Ambulatory Visit: Payer: Self-pay | Admitting: Medical

## 2015-01-15 ENCOUNTER — Ambulatory Visit (INDEPENDENT_AMBULATORY_CARE_PROVIDER_SITE_OTHER): Payer: BC Managed Care – PPO | Admitting: Medical

## 2015-01-15 VITALS — BP 110/80 | HR 75 | Temp 98.1°F | Resp 15 | Wt 231.0 lb

## 2015-01-15 DIAGNOSIS — L989 Disorder of the skin and subcutaneous tissue, unspecified: Secondary | ICD-10-CM

## 2015-01-15 DIAGNOSIS — L918 Other hypertrophic disorders of the skin: Secondary | ICD-10-CM

## 2015-01-15 DIAGNOSIS — L089 Local infection of the skin and subcutaneous tissue, unspecified: Secondary | ICD-10-CM

## 2015-01-15 DIAGNOSIS — B079 Viral wart, unspecified: Secondary | ICD-10-CM

## 2015-01-15 NOTE — Progress Notes (Signed)
Subjective: Here for biopsy and remove skin lesions.   Has wart on right 3rd finger that has gotten bigger.  Has growing lesions of left neck that get caught on shirt and bleed at times.  Has 2 lesions of upper back we discussed last visit.  Objective: BP 110/80 mmHg  Pulse 75  Temp(Src) 98.1 F (36.7 C) (Oral)  Resp 15  Wt 231 lb (104.781 kg)  General appearance: alert, no distress, WD/WN, overweight black male  Skin: left neck with 3 large skin tags, right medial 3rd finger with 1cm wart, 2 raised crusted 2-24mm diameter brown lesions of upper back on the left  Assessment: Encounter Diagnoses  Name Primary?  . Changing skin lesion Yes  . Inflamed skin tag   . Wart      Plan: Discussed lesions, procedures.    Right 3rd finger wart - cleaned and prepped area in usual sterile fashion.  Used 1% lidocaine without epi for digital block of right 3rd finger.  Used derma blade to remove the bulk of the warty lesion of right medial 3rd finger 1cm diameter, and used hyphercater to destroy the base   Left neck skin tags, 2 separate pedunculated brown skin tags, benign appearing - cleaned and prepped area in usual sterile fashion.  Used ethyl acetate spray for local anesthesia and cut lesion off entirely including base with scissors.  Achieved hemostasis with direct pressure   Upper back 2 lesions - cleaned and prepped area in usual sterile fashion.  Used 1% lidocaine without epi for local anesthesia.  After using derma blade to start excision it became apparent that the 2 upper back lesions were actually sebaceous cysts with brown cheesy material coming out of entry hole.  Thus, we didn't proceed any further since they were small, noninfected, non problematic lesions  EBL of all 3 lesions 2 was 2 cc.  Cleaned wounds, covered the finger warty area with sterile bandage and bactroban.   Discussed wound care. F/u pending biopsy of warty lesion.

## 2015-07-17 ENCOUNTER — Ambulatory Visit: Payer: BC Managed Care – PPO | Admitting: Medical

## 2015-07-20 ENCOUNTER — Encounter: Payer: Self-pay | Admitting: Medical

## 2015-07-20 ENCOUNTER — Ambulatory Visit (INDEPENDENT_AMBULATORY_CARE_PROVIDER_SITE_OTHER): Payer: BC Managed Care – PPO | Admitting: Medical

## 2015-07-20 ENCOUNTER — Ambulatory Visit
Admission: RE | Admit: 2015-07-20 | Discharge: 2015-07-20 | Disposition: A | Discharge status: Home / Self Care | Payer: BC Managed Care – PPO | Source: Ambulatory Visit | Attending: Medical | Admitting: Medical

## 2015-07-20 VITALS — BP 138/84 | HR 83 | Wt 235.0 lb

## 2015-07-20 DIAGNOSIS — M25562 Pain in left knee: Secondary | ICD-10-CM | POA: Diagnosis not present

## 2015-07-20 DIAGNOSIS — R29898 Other symptoms and signs involving the musculoskeletal system: Secondary | ICD-10-CM | POA: Diagnosis not present

## 2015-07-20 NOTE — Progress Notes (Signed)
Subjective: Chief Complaint  Patient presents with  . numbness in knee    states from the knee down on lt side he is numb. said it has been going on for "awhile" but recently gotten worse.    Here for left knee issues.   He notes numbness in left knee and down the leg.   Been having numbness for months ,but worse in last few weeks.   Feels weakness in left leg.   No burning, but does get tingling sensation in the left leg.  Seems to involve "inside the knee" and tingling/numbness down leg, but not complete lack of sensation.   No tingling or numbness of thigh.   Denies left foot drop.   No buttock pain.   No swelling of knee.   Gets back pain from time to time, back problems for years.   Over the years he has had sports issues with the left knee, swelling and pain from time to time.   He notes walking 3 miles the other day and had left knee pain and "weakness" afterwards.   He is cautious with stairs due to left knee.  After further discussion he says he has sensation in the leg, and maybe numbness was not the correct word to use.  No other aggravating or relieving factors. No other complaint.   Past Medical History  Diagnosis Date  . Hyperlipidemia   . Hypertension   . ED (erectile dysfunction)   . Allergy     RHINITS  . Diverticulosis   . Impaired fasting glucose   . Substance abuse     TOBACCO (SMOKER)  . Smoker   . History of cardiovascular stress test 03/20/2008    dobutamine stress test, normal study, Dr. Verlon Setting  . H/O echocardiogram 03/20/08    mild - moderate LVH, EF 50-55%, mild RV enlargement, mild mitral regurgitation, Dr. Verlon Setting  . Chronic shoulder pain     left  . History of neck surgery    Past Surgical History  Procedure Laterality Date  . Spine surgery  2011(JENKINS,MD)    CERVICAL DISC AND FUSION  . Colonoscopy  10/2010    diverticulosis, othewrise normal, repeat 10 years, Dr. Henrene Pastor  . Cdisc & fusion  2011    Dr. Arnoldo Morale   ROS as in subjective   Objective: BP  138/84 mmHg  Pulse 83  Wt 235 lb (106.595 kg)  Gen: wd, wn, nad Back: nontender, ROM without pain, ROM about 85% of normal with flexion and extension Skin unremarkable  Pedal pulses normal Neuro: normal leg strength, sensation, and DTRs 1-2+ bilat patellar and achilles reflexes MSK: left knee nontender, no swelling, there is pain with left knee flexion >70 degrees, otherwise no pain, no knee swelling or laxity, and rest of bilat LE exam nontender, no deformity normal ROM.  Right leg unremarkable    Assessment: Encounter Diagnoses  Name Primary?  . Left knee pain Yes  . Weakness of left leg     Plan: After discussing symptoms and concerns, his problems seems to be knee related and not truly numbness or radicular issue .  I suspect meniscal concern vs arthritis.   Will send for knee xray.   Can use OTC NSAID prn, ice prn when symptomatic .  We will call with xray and recommendations .

## 2015-10-30 ENCOUNTER — Encounter: Payer: Self-pay | Admitting: Internal Medicine

## 2015-12-14 ENCOUNTER — Ambulatory Visit (INDEPENDENT_AMBULATORY_CARE_PROVIDER_SITE_OTHER): Payer: BC Managed Care – PPO | Admitting: Medical

## 2015-12-14 ENCOUNTER — Encounter: Payer: Self-pay | Admitting: Medical

## 2015-12-14 VITALS — BP 128/84 | HR 87 | Ht 70.75 in | Wt 238.0 lb

## 2015-12-14 DIAGNOSIS — Z0000 Encounter for general adult medical examination without abnormal findings: Secondary | ICD-10-CM | POA: Diagnosis not present

## 2015-12-14 DIAGNOSIS — I10 Essential (primary) hypertension: Secondary | ICD-10-CM | POA: Diagnosis not present

## 2015-12-14 DIAGNOSIS — E785 Hyperlipidemia, unspecified: Secondary | ICD-10-CM | POA: Diagnosis not present

## 2015-12-14 DIAGNOSIS — M545 Low back pain, unspecified: Secondary | ICD-10-CM | POA: Insufficient documentation

## 2015-12-14 DIAGNOSIS — F17200 Nicotine dependence, unspecified, uncomplicated: Secondary | ICD-10-CM

## 2015-12-14 DIAGNOSIS — R7301 Impaired fasting glucose: Secondary | ICD-10-CM | POA: Diagnosis not present

## 2015-12-14 DIAGNOSIS — N528 Other male erectile dysfunction: Secondary | ICD-10-CM

## 2015-12-14 DIAGNOSIS — E669 Obesity, unspecified: Secondary | ICD-10-CM | POA: Diagnosis not present

## 2015-12-14 LAB — COMPREHENSIVE METABOLIC PANEL
ALT: 31 U/L (ref 9–46)
AST: 26 U/L (ref 10–35)
Albumin: 4.4 g/dL (ref 3.6–5.1)
Alkaline Phosphatase: 61 U/L (ref 40–115)
BUN: 16 mg/dL (ref 7–25)
CHLORIDE: 100 mmol/L (ref 98–110)
CO2: 26 mmol/L (ref 20–31)
CREATININE: 0.95 mg/dL (ref 0.70–1.33)
Calcium: 9.7 mg/dL (ref 8.6–10.3)
GLUCOSE: 85 mg/dL (ref 65–99)
Potassium: 3.8 mmol/L (ref 3.5–5.3)
SODIUM: 137 mmol/L (ref 135–146)
TOTAL PROTEIN: 7.2 g/dL (ref 6.1–8.1)
Total Bilirubin: 1.1 mg/dL (ref 0.2–1.2)

## 2015-12-14 LAB — POCT URINALYSIS DIPSTICK
Bilirubin, UA: NEGATIVE
Blood, UA: NEGATIVE
Glucose, UA: NEGATIVE
KETONES UA: NEGATIVE
Leukocytes, UA: NEGATIVE
Nitrite, UA: NEGATIVE
PROTEIN UA: NEGATIVE
Urobilinogen, UA: NEGATIVE
pH, UA: 5.5

## 2015-12-14 LAB — CBC
HCT: 45.8 % (ref 38.5–50.0)
Hemoglobin: 15.9 g/dL (ref 13.2–17.1)
MCH: 30.9 pg (ref 27.0–33.0)
MCHC: 34.7 g/dL (ref 32.0–36.0)
MCV: 89.1 fL (ref 80.0–100.0)
MPV: 10.9 fL (ref 7.5–12.5)
PLATELETS: 258 10*3/uL (ref 140–400)
RBC: 5.14 MIL/uL (ref 4.20–5.80)
RDW: 14.6 % (ref 11.0–15.0)
WBC: 9.4 10*3/uL (ref 4.0–10.5)

## 2015-12-14 LAB — LIPID PANEL
Cholesterol: 164 mg/dL (ref 125–200)
HDL: 50 mg/dL (ref >=40)
LDL CALC: 82 mg/dL (ref <130)
Total CHOL/HDL Ratio: 3.3 Ratio (ref <=5.0)
Triglycerides: 159 mg/dL — ABNORMAL HIGH (ref <150)
VLDL: 32 mg/dL — AB (ref <30)

## 2015-12-14 LAB — HEMOGLOBIN A1C
HEMOGLOBIN A1C: 5.7 % — AB (ref <5.7)
MEAN PLASMA GLUCOSE: 117 mg/dL

## 2015-12-14 MED ORDER — ATORVASTATIN CALCIUM 20 MG PO TABS: 20.0000 mg | ORAL_TABLET | Freq: Every day | ORAL | Status: DC

## 2015-12-14 MED ORDER — VALACYCLOVIR HCL 500 MG PO TABS: ORAL_TABLET | ORAL | Status: DC

## 2015-12-14 MED ORDER — LISINOPRIL-HYDROCHLOROTHIAZIDE 10-12.5 MG PO TABS: 1.0000 | ORAL_TABLET | Freq: Every day | ORAL | Status: DC

## 2015-12-14 NOTE — Addendum Note (Signed)
Addended by: Billie Lade on: 12/14/2015 09:55 AM   Modules accepted: Miquel Dunn

## 2015-12-14 NOTE — Progress Notes (Signed)
Subjective:   HPI  James Bird is a 55 y.o. male who presents for a complete physical.  Medical care team/other doctors includes *Dr Newman Pies neurosurgeon for neck surgery 5 years ago Dorothea Ogle, PA-C here for primary care with Dr. Claris Gladden eye doctor, dentist    Concerns: No particularly problems," just getting old", can bend and move like he used to  Does have some right low back pain and hard to lie down sometimes.  No leg pain, no weakness, numbness or tingling in leg.     Reviewed their medical, surgical, family, social, medication, and allergy history and updated chart as appropriate.  Past Medical History  Diagnosis Date  . Hyperlipidemia   . Hypertension   . ED (erectile dysfunction)   . Allergy     RHINITS  . Diverticulosis   . Impaired fasting glucose   . Smoker   . History of cardiovascular stress test 03/20/2008    dobutamine stress test, normal study, Dr. Verlon Setting  . H/O echocardiogram 03/20/08    mild - moderate LVH, EF 50-55%, mild RV enlargement, mild mitral regurgitation, Dr. Verlon Setting  . Chronic shoulder pain     left  . History of neck surgery   . 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 Spine surgery  2011(JENKINS,MD)    CERVICAL DISC AND FUSION  . Colonoscopy  10/2010    diverticulosis, othewrise normal, repeat 10 years, Dr. Henrene Pastor  . Cdisc & fusion  2011    Dr. Arnoldo Morale    Social History   Social History  . Marital Status: Single    Spouse Name: N/A  . Number of Children: N/A  . Years of Education: N/A   Occupational History  . Clinical biochemist    Social History Main Topics  . Smoking status: Current Every Day Smoker -- 0.50 packs/day for 16 years    Types: Cigarettes  . Smokeless tobacco: Not on file  . Alcohol Use: 3.0 oz/week    5 Cans of beer per week     Comment: 10 drinks per week, sometimes more  . Drug Use: Yes     Comment: occasional recreational  cocaine use (noted 11/2013)  . Sexual Activity: Not on file   Other Topics Concern  . Not on file   Social History Narrative   Single, 2 daughters, exercise - racquetball and walking, works in IT at [employer].      Family History  Problem Relation Age of Onset  . Cancer Mother     LUNG  . Heart disease Father     MI  . Cancer Paternal Aunt   . Cancer Paternal Uncle   . Hypertension Brother   . Diabetes Neg Hx   . Stroke Neg Hx      Current outpatient prescriptions:  .  atorvastatin (LIPITOR) 20 MG tablet, Take 1 tablet (20 mg total) by mouth daily at 6 PM., Disp: 90 tablet, Rfl: 3 .  lisinopril-hydrochlorothiazide (PRINZIDE,ZESTORETIC) 10-12.5 MG tablet, Take 1 tablet by mouth daily., Disp: 90 tablet, Rfl: 3 .  sildenafil (VIAGRA) 100 MG tablet, Take 1 tablet (100 mg total) by mouth as needed for erectile dysfunction. (Patient not taking: Reported on 12/14/2015), Disp: 6 tablet, Rfl: 3 .  valACYclovir (VALTREX) 500 MG tablet, 1 tablet po BID x 3 days for flare up, Disp: 30 tablet, Rfl: 0  No Known Allergies  Review of Systems Constitutional: -fever, -chills, -sweats, -unexpected weight change, -decreased appetite, -fatigue Allergy: -sneezing, -itching, -congestion Dermatology: -changing moles, --rash, -lumps ENT: -runny nose, -ear pain, -sore throat, -hoarseness, -sinus pain, -teeth pain, - ringing in ears, -hearing loss, -nosebleeds Cardiology: -chest pain, -palpitations, -swelling, -difficulty breathing when lying flat, -waking up short of breath Respiratory: +cough, -shortness of breath, -difficulty breathing with exercise or exertion, -wheezing, -coughing up blood Gastroenterology: -abdominal pain, -nausea, -vomiting, -diarrhea, -constipation, -blood in stool, -changes in bowel movement, -difficulty swallowing or eating Hematology: -bleeding, -bruising  Musculoskeletal: -joint aches, -muscle aches, -joint swelling, +back pain, -neck pain, -cramping, -changes in  gait Ophthalmology: denies vision changes, eye redness, itching, discharge Urology: -burning with urination, -difficulty urinating, -blood in urine, -urinary frequency, -urgency, -incontinence Neurology: -headache, -weakness, -tingling, -numbness, -memory loss, -falls, -dizziness Psychology: -depressed mood, -agitation, -sleep problems     Objective:   Physical Exam  BP 128/84 mmHg  Pulse 87  Ht 5' 10.75" (1.797 m)  Wt 238 lb (107.956 kg)  BMI 33.43 kg/m2  Wt Readings from Last 3 Encounters:  12/14/15 238 lb (107.956 kg)  07/20/15 235 lb (106.595 kg)  01/15/15 231 lb (104.781 kg)   General appearance: alert, no distress, WD/WN, overweight black male  Skin: several benign appearing macules of face, and erythema and raised papular lesions of scalp suggestive of seborrheic dermatitis and similar inflamed lesion of neck suggestive of folliculitis barbae  HEENT: normocephalic, conjunctiva/corneas normal, sclerae anicteric, PERRLA, EOMi, nares patent, no discharge or erythema, pharynx normal  Oral cavity: MMM, tongue normal, teeth in good repair  Neck: supple, no lymphadenopathy, no thyromegaly, no masses, normal ROM, no bruits  Chest: non tender, normal shape and expansion  Heart: RRR, normal S1, S2, no murmurs  Lungs: CTA bilaterally, no wheezes, rhonchi, or rales  Abdomen: +bs, soft, [id number] umbilical hernia, reducible, otherwise non tender, non distended, no masses, no hepatomegaly, no splenomegaly, no bruits  Back: non tender, mild pain with back flexion and extension, normal ROM, no scoliosis  Musculoskeletal: extremities non tender, no obvious deformity, normal ROM throughout Extremities: no edema, no cyanosis, no clubbing  Pulses: 2+ symmetric, upper and lower extremities, normal cap refill  Neurological: alert, oriented x 3, CN2-12 intact, strength normal upper extremities and lower extremities, sensation normal throughout, DTRs 2+ throughout, no cerebellar signs, gait  normal  Psychiatric: normal affect, behavior normal, pleasant  GU: normal male external genitalia, uncircumcised, raised healing vesicular lesion of right penis shaft, nontender, no masses, no hernia, no lymphadenopathy  Rectal: deferred    Assessment and Plan :    Encounter Diagnoses  Name Primary?  . Routine general medical examination at a health care facility Yes  . Essential hypertension, benign   . Impaired fasting blood sugar   . Obesity   . Other male erectile dysfunction   . Hyperlipidemia   . Tobacco use disorder   . Right-sided low back pain without sciatica     Physical exam - discussed healthy lifestyle, diet, exercise, preventative care, vaccinations, and addressed their concerns.   See your dentist yearly for routine dental care including hygiene visits twice yearly. See your eye doctor yearly for routine vision care. Routine labs today.   Vaccinations: Up to date on tdap, advised yearly flu vaccine, up to date on pneumococcal vaccine   Other concerns today: Counseled on the risks and dangers of tobacco Hypertension, Hyperlipidemia, impaired glucose - C/t current medications, labs today, work on healthy diet and exercise Advised weight loss Back pain -  advised core strengthening, regular stretching, c/t regular exercise.  Consider PT if needed/if worsening  Follow up pending labs

## 2015-12-15 ENCOUNTER — Other Ambulatory Visit: Payer: Self-pay | Admitting: Medical

## 2016-07-18 ENCOUNTER — Other Ambulatory Visit: Payer: Self-pay

## 2016-07-18 ENCOUNTER — Ambulatory Visit (INDEPENDENT_AMBULATORY_CARE_PROVIDER_SITE_OTHER): Payer: BC Managed Care – PPO | Admitting: Family Medicine

## 2016-07-18 ENCOUNTER — Encounter: Payer: Self-pay | Admitting: Family Medicine

## 2016-07-18 VITALS — BP 122/72 | HR 66 | Temp 97.8°F | Wt 241.0 lb

## 2016-07-18 DIAGNOSIS — Z23 Encounter for immunization: Secondary | ICD-10-CM | POA: Diagnosis not present

## 2016-07-18 DIAGNOSIS — R258 Other abnormal involuntary movements: Secondary | ICD-10-CM

## 2016-07-18 DIAGNOSIS — M21371 Foot drop, right foot: Secondary | ICD-10-CM | POA: Diagnosis not present

## 2016-07-18 DIAGNOSIS — M1712 Unilateral primary osteoarthritis, left knee: Secondary | ICD-10-CM

## 2016-07-18 MED ORDER — ALPRAZOLAM 0.25 MG PO TABS: 0.2500 mg | ORAL_TABLET | Freq: Two times a day (BID) | ORAL | 0 refills | Status: DC | PRN

## 2016-07-18 NOTE — Telephone Encounter (Signed)
Called in xanax per jcl 

## 2016-07-18 NOTE — Progress Notes (Signed)
   Subjective:    Patient ID: James Bird, male    DOB: 30-Apr-1961, 55 y.o.   MRN: [id number]  HPI He is here for 2 complaints. He has noted especially over the last 6 months, tripping and inability to adequately lift his right foot while walking. He does feel some weakness in that. He's had no bowel or bladder issues, back pain, headache, upper extremity injury. He does have a previous history of low back pain approximately 20 years ago and states that apparently he did have a herniated disc. He also had right cervical disc surgery in 2011. At that time he also remembers the surgeon mentioning something about clonus. He also complains of a greater than 1 year difficulty with left knee weakness with occasionally giving it away as well as some tingling sensation distal to that. No popping, locking, grinding or effusion. He does remember injuring it in high school football. He was seen one year ago for a similar issue and did have x-rays at that time which did show arthritic changes.   Review of Systems     Objective:   Physical Exam Alert and in no distress. Full motion of the neck. Exam of the right leg does show slight deficit in dorsiflexion of the foot with some motor weakness DTR of that knee was 3+ with 3-4 beat clonus noted on the right as well. Left knee showed a normal reflex and no clonus. The left knee showed minimal effusion with positive anterior drawer. No lipping noted. Medial and lateral collateral ligaments intact. X-ray did show degenerative changes.       Assessment & Plan:  Right foot drop - Plan: MR Cervical Spine Wo Contrast, ALPRAZolam (XANAX) 0.25 MG tablet  Clonus - Plan: MR Cervical Spine Wo Contrast, ALPRAZolam (XANAX) 0.25 MG tablet  Arthritis of left knee  Need for prophylactic vaccination and inoculation against influenza - Plan: Flu Vaccine QUAD 36+ mos IM Apparently the clonus is been there for quite some time but the weakness is apparently something new  and I will therefore order an MRI and refer back to Dr. Arnoldo Morale who had seen him in the past. I discussed proper care of the knee especially with the arthritis. I think eventually he will need a knee replacement but will send him to orthopedics for injections. Xanax given due to his phobia with the MRI.

## 2016-07-27 ENCOUNTER — Ambulatory Visit
Admission: RE | Admit: 2016-07-27 | Discharge: 2016-07-27 | Disposition: A | Discharge status: Home / Self Care | Payer: BC Managed Care – PPO | Source: Ambulatory Visit | Attending: Family Medicine | Admitting: Family Medicine

## 2016-07-27 DIAGNOSIS — R258 Other abnormal involuntary movements: Secondary | ICD-10-CM

## 2016-07-27 DIAGNOSIS — M21371 Foot drop, right foot: Secondary | ICD-10-CM

## 2016-09-23 ENCOUNTER — Telehealth: Payer: Self-pay | Admitting: Internal Medicine

## 2016-09-23 NOTE — Telephone Encounter (Signed)
The MRI was just for the cervical spine and he should've been referred back to his neurosurgeon

## 2016-09-23 NOTE — Telephone Encounter (Signed)
Pt will follow-up with neurosurgeon

## 2016-09-23 NOTE — Telephone Encounter (Signed)
Pt was suppose to have an MRI of his whole spine and when he went to try to get the MRI done a while back he was told it was just of the neck and not the whole spine. Pt wanted to verify

## 2016-12-13 ENCOUNTER — Other Ambulatory Visit: Payer: BC Managed Care – PPO

## 2016-12-13 ENCOUNTER — Other Ambulatory Visit: Payer: Self-pay | Admitting: Medical

## 2016-12-14 ENCOUNTER — Other Ambulatory Visit: Payer: Self-pay | Admitting: Medical

## 2017-01-02 ENCOUNTER — Telehealth: Payer: Self-pay | Admitting: Family Medicine

## 2017-01-02 NOTE — Telephone Encounter (Signed)
James Bird @ [facility] called stating that pt's insurance require a prior auth for MRI

## 2017-01-03 ENCOUNTER — Telehealth: Payer: Self-pay

## 2017-01-03 NOTE — Telephone Encounter (Signed)
I have called [facility] talked with Dorian Pod to let her know I could not get a new authorization due to pt has not been here in 6 months. I called to inform patient I could not get a new authorization because it has been  6 months and in the notes from Feb. He stated he would follow up with his neurosurgeon he said so I have to start the whole process again I said it would be best to go to Neurosurgeon because he wanted an MRI of his whole spine not just the cervical  as to what Dr.Lalonde had ordered he said Dr.Jenkins new nothing of this did we send him notes I told him they could look in EPIC and see the notes I was trying to save him money not to have to come here pay a co pay then be sent to Neuro He said that was not it at all I was just washing my hands of him I tried to tell him no that wasn't it at all I could make him an appointment to see Dr.Lalonde if that is what he wanted he said no he would take care of it him self.

## 2017-01-03 NOTE — Telephone Encounter (Signed)
Forwarding to [employer]

## 2017-01-03 NOTE — Telephone Encounter (Signed)
Error

## 2017-01-05 ENCOUNTER — Other Ambulatory Visit: Payer: BC Managed Care – PPO

## 2017-03-10 ENCOUNTER — Other Ambulatory Visit: Payer: Self-pay | Admitting: Medical

## 2017-03-10 NOTE — Telephone Encounter (Signed)
LM that pt needs to schedule med check. 30 day supply sent. James Bird December

## 2017-03-29 ENCOUNTER — Ambulatory Visit (INDEPENDENT_AMBULATORY_CARE_PROVIDER_SITE_OTHER): Payer: BC Managed Care – PPO | Admitting: Medical

## 2017-03-29 ENCOUNTER — Encounter: Payer: Self-pay | Admitting: Medical

## 2017-03-29 VITALS — BP 136/90 | HR 79 | Ht 71.0 in | Wt 228.8 lb

## 2017-03-29 DIAGNOSIS — Z7185 Encounter for immunization safety counseling: Secondary | ICD-10-CM | POA: Insufficient documentation

## 2017-03-29 DIAGNOSIS — E785 Hyperlipidemia, unspecified: Secondary | ICD-10-CM

## 2017-03-29 DIAGNOSIS — M1712 Unilateral primary osteoarthritis, left knee: Secondary | ICD-10-CM

## 2017-03-29 DIAGNOSIS — Z7189 Other specified counseling: Secondary | ICD-10-CM | POA: Diagnosis not present

## 2017-03-29 DIAGNOSIS — M21371 Foot drop, right foot: Secondary | ICD-10-CM | POA: Insufficient documentation

## 2017-03-29 DIAGNOSIS — Z125 Encounter for screening for malignant neoplasm of prostate: Secondary | ICD-10-CM

## 2017-03-29 DIAGNOSIS — Z0000 Encounter for general adult medical examination without abnormal findings: Secondary | ICD-10-CM | POA: Diagnosis not present

## 2017-03-29 DIAGNOSIS — I10 Essential (primary) hypertension: Secondary | ICD-10-CM | POA: Diagnosis not present

## 2017-03-29 DIAGNOSIS — E669 Obesity, unspecified: Secondary | ICD-10-CM

## 2017-03-29 DIAGNOSIS — R7301 Impaired fasting glucose: Secondary | ICD-10-CM | POA: Diagnosis not present

## 2017-03-29 DIAGNOSIS — F17200 Nicotine dependence, unspecified, uncomplicated: Secondary | ICD-10-CM | POA: Diagnosis not present

## 2017-03-29 DIAGNOSIS — Z6831 Body mass index (BMI) 31.0-31.9, adult: Secondary | ICD-10-CM

## 2017-03-29 DIAGNOSIS — R258 Other abnormal involuntary movements: Secondary | ICD-10-CM | POA: Insufficient documentation

## 2017-03-29 DIAGNOSIS — N528 Other male erectile dysfunction: Secondary | ICD-10-CM

## 2017-03-29 LAB — CBC
HCT: 45.8 % (ref 38.5–50.0)
Hemoglobin: 16 g/dL (ref 13.2–17.1)
MCH: 31.1 pg (ref 27.0–33.0)
MCHC: 34.9 g/dL (ref 32.0–36.0)
MCV: 88.9 fL (ref 80.0–100.0)
MPV: 10.5 fL (ref 7.5–12.5)
PLATELETS: 253 10*3/uL (ref 140–400)
RBC: 5.15 MIL/uL (ref 4.20–5.80)
RDW: 14.3 % (ref 11.0–15.0)
WBC: 9.1 10*3/uL (ref 4.0–10.5)

## 2017-03-29 LAB — POCT URINALYSIS DIP (PROADVANTAGE DEVICE)
BILIRUBIN UA: NEGATIVE
GLUCOSE UA: NEGATIVE mg/dL
Ketones, POC UA: NEGATIVE mg/dL
NITRITE UA: NEGATIVE
RBC UA: NEGATIVE
Specific Gravity, Urine: 1.03
Urobilinogen, Ur: NEGATIVE
pH, UA: 6 (ref 5.0–8.0)

## 2017-03-29 MED ORDER — VARENICLINE TARTRATE 0.5 MG X 11 & 1 MG X 42 PO MISC: ORAL | 0 refills | Status: DC

## 2017-03-29 NOTE — Progress Notes (Signed)
Subjective:   HPI  James Bird is a 56 y.o. male who presents for a complete physical.  Medical care team/other doctors includes *Dr Newman Pies neurosurgeon for neck surgery 5 years ago Dorothea Ogle, PA-C here for primary care with Dr. Claris Gladden eye doctor, dentist    Concerns: Still smoking.  Still gets left knee pains, hx/o left knee arthritis  Still having issues with left knee.    Still has some right foot drop, saw Dr. Redmond School for this few months ago.  Has clonus in right leg long standing since last surgery.  Reviewed their medical, surgical, family, social, medication, and allergy history and updated chart as appropriate.  Past Medical History:  Diagnosis Date  . Allergy    RHINITS  . Chronic shoulder pain    left  . Diverticulosis   . ED (erectile dysfunction)   . H/O echocardiogram 03/20/08   mild - moderate LVH, EF 50-55%, mild RV enlargement, mild mitral regurgitation, Dr. Verlon Setting  . 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 History of cardiovascular stress test 03/20/2008   dobutamine stress test, normal study, Dr. Verlon Setting  . History of neck surgery   . Hyperlipidemia   . Hypertension   . Impaired fasting glucose   . Smoker     Past Surgical History:  Procedure Laterality Date  . cdisc & fusion  2011   Dr. Arnoldo Morale  . COLONOSCOPY  10/2010   diverticulosis, othewrise normal, repeat 10 years, Dr. Henrene Pastor  . SPINE SURGERY  2011(JENKINS,MD)   CERVICAL DISC AND FUSION    Social History   Social History  . Marital status: Single    Spouse name: N/A  . Number of children: N/A  . Years of education: N/A   Occupational History  . database administrator [employer]   Social History Main Topics  . Smoking status: Current Every Day Smoker    Packs/day: 0.50    Years: 16.00    Types: Cigarettes  . Smokeless tobacco: Never Used  . Alcohol use 6.0 oz/week    10 Cans of beer per week     Comment: 10 drinks per week, sometimes more  .  Drug use: Yes     Comment: occasional recreational cocaine use (noted 11/2013)  . Sexual activity: Not on file   Other Topics Concern  . 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   03/2017    Family History  Problem Relation Age of Onset  . Cancer Mother        LUNG  . Heart disease Father        MI  . Cancer Paternal Aunt   . Cancer Paternal Uncle   . Hypertension Brother   . Diabetes Neg Hx   . Stroke Neg Hx      Current Outpatient Prescriptions:  .  atorvastatin (LIPITOR) 20 MG tablet, TAKE 1 TABLET(20 MG) BY MOUTH DAILY AT 6 PM, Disp: 30 tablet, Rfl: 0 .  lisinopril-hydrochlorothiazide (PRINZIDE,ZESTORETIC) 10-12.5 MG tablet, TAKE 1 TABLET BY MOUTH DAILY, Disp: 90 tablet, Rfl: 0 .  sildenafil (VIAGRA) 100 MG tablet, Take 1 tablet (100 mg total) by mouth as needed for erectile dysfunction., Disp: 6 tablet, Rfl: 3 .  valACYclovir (VALTREX) 500 MG tablet, 1 tablet po BID x 3 days  for flare up (Patient not taking: Reported on 03/29/2017), Disp: 30 tablet, Rfl: 0  No Known Allergies   Review of Systems Constitutional: -fever, -chills, -sweats, -unexpected weight change, -decreased appetite, -fatigue Allergy: -sneezing, -itching, -congestion Dermatology: -changing moles, --rash, -lumps ENT: -runny nose, -ear pain, -sore throat, -hoarseness, -sinus pain, -teeth pain, - ringing in ears, -hearing loss, -nosebleeds Cardiology: -chest pain, -palpitations, -swelling, -difficulty breathing when lying flat, -waking up short of breath Respiratory: -cough, -shortness of breath, -difficulty breathing with exercise or exertion, -wheezing, -coughing up blood Gastroenterology: -abdominal pain, -nausea, -vomiting, -diarrhea, -constipation, -blood in stool, -changes in bowel movement, -difficulty swallowing or eating Hematology: -bleeding, -bruising   Musculoskeletal: +joint aches, -muscle aches, -joint swelling, +back pain, -neck pain, -cramping, -changes in gait Ophthalmology: denies vision changes, eye redness, itching, discharge Urology: -burning with urination, -difficulty urinating, -blood in urine, -urinary frequency, -urgency, -incontinence Neurology: -headache, -weakness, -tingling, -numbness, -memory loss, -falls, -dizziness Psychology: -depressed mood, -agitation, -sleep problems     Objective:   Physical Exam  BP 136/90   Pulse 79   Ht 5\' 11"  (1.803 m)   Wt 228 lb 12.8 oz (103.8 kg)   SpO2 96%   BMI 31.91 kg/m   Wt Readings from Last 3 Encounters:  03/29/17 228 lb 12.8 oz (103.8 kg)  07/18/16 241 lb (109.3 kg)  12/14/15 238 lb (108 kg)   General appearance: alert, no distress, WD/WN, overweight black male  Skin: several benign appearing macules of face, and erythema and raised papular lesions of scalp suggestive of seborrheic dermatitis and similar inflamed lesion of neck suggestive of folliculitis barbae  HEENT: normocephalic, conjunctiva/corneas normal, sclerae anicteric, PERRLA, EOMi, nares patent, no discharge or erythema, pharynx normal  Oral cavity: MMM, tongue normal, teeth in good repair  Neck: supple, no lymphadenopathy, no thyromegaly, no masses, normal ROM, no bruits  Chest: non tender, normal shape and expansion  Heart: RRR, normal S1, S2, no murmurs  Lungs: CTA bilaterally, no wheezes, rhonchi, or rales  Abdomen: +bs, soft, +2.6ZT umbilical hernia, reducible, otherwise non tender, non distended, no masses, no hepatomegaly, no splenomegaly, no bruits  Back: non tender, mild pain with back flexion and extension, normal ROM, no scoliosis  Musculoskeletal: left knee with mild pain with Mcmurray,no laxity, no swelling, otherwise MSK unremarkable extremities non tender, no obvious deformity, normal ROM throughout Extremities: no edema, no cyanosis, no clubbing  Pulses: 2+ symmetric, upper and  lower extremities, normal cap refill  Neurological: right lower leg with 3 beats of clonus, decreased DTRs, and slight foot drop noted with ambulation, alert, oriented x 3, CN2-12 intact, strength normal upper extremities and lower extremities, sensation normal throughout, DTRs 2+ throughout, no cerebellar signs, gait normal  Psychiatric: normal affect, behavior normal, pleasant  GU: normal male external genitalia, uncircumcised, nontender, no masses, no hernia, no lymphadenopathy  Rectal: anus normal tone, prostate mildly enlarged, no nodules, occult negative stool    Assessment and Plan :    Encounter Diagnoses  Name Primary?  . Routine general medical examination at a health care facility Yes  . Essential hypertension, benign   . Impaired fasting blood sugar   . Other male erectile dysfunction   . Hyperlipidemia, unspecified hyperlipidemia type   . Tobacco use disorder   . Class 1 obesity with serious comorbidity and body mass index (BMI) of 31.0 to 31.9 in adult, unspecified obesity type   . Screening for prostate cancer   . Right foot drop   . Clonus   . Osteoarthritis of  left knee, unspecified osteoarthritis type   . Vaccine counseling     Physical exam - discussed healthy lifestyle, diet, exercise, preventative care, vaccinations, and addressed their concerns.   See your dentist yearly for routine dental care including hygiene visits twice yearly. See your eye doctor yearly for routine vision care. Routine labs today.   Vaccinations: Up to date on tdap, advised yearly flu vaccine, up to date on pneumococcal vaccine I recommend you have a shingles vaccine to help prevent shingles or herpes zoster outbreak.   Please call your insurer to inquire about coverage for the Shingrix vaccine given in 2 doses.   Some insurers cover this vaccine after age 21, some cover this after age 80.  If your insurer covers this, then call to schedule appointment to have this vaccine  here.  Other concerns today: Counseled on the risks and dangers of tobacco.  Begin trial of chantix after discussing risks/benefits Hypertension, Hyperlipidemia, impaired glucose - C/t current medications, labs today, work on healthy diet and exercise Advised weight loss Left knee OA - pending labs, consider referral to ortho Foot drop - pending labs, consider imaging  Follow up pending labs  Trevar was seen today for annual exam.  Diagnoses and all orders for this visit:  Routine general medical examination at a health care facility -     POCT Urinalysis DIP (Proadvantage Device) -     Comprehensive metabolic panel -     CBC -     Lipid panel -     PSA -     Microalbumin / creatinine urine ratio -     Hemoglobin A1c  Essential hypertension, benign  Impaired fasting blood sugar -     Hemoglobin A1c  Other male erectile dysfunction  Hyperlipidemia, unspecified hyperlipidemia type -     Lipid panel  Tobacco use disorder  Class 1 obesity with serious comorbidity and body mass index (BMI) of 31.0 to 31.9 in adult, unspecified obesity type  Screening for prostate cancer -     PSA  Right foot drop  Clonus  Osteoarthritis of left knee, unspecified osteoarthritis type  Vaccine counseling  Other orders -     varenicline (CHANTIX STARTING MONTH PAK) 0.5 MG X 11 & 1 MG X 42 tablet; Take one 0.5 mg tablet by mouth once daily for 3 days, then increase to one 0.5 mg tablet twice daily for 4 days, then increase to one 1 mg tablet twice daily.

## 2017-03-29 NOTE — Patient Instructions (Addendum)
Recommendations: See your eye doctor yearly for routine vision care.  See your dentist yearly for routine dental care including hygiene visits twice yearly.  Vaccine recommendations  Get a yearly flu shot either here or at work.  If at work, send [place] a copy of the vaccine record for documentation  I recommend you have a shingles vaccine to help prevent shingles or herpes zoster outbreak.   Please call your insurer to inquire about coverage for the Shingrix vaccine given in 2 doses.   Some insurers cover this vaccine after age 40, some cover this after age 58.  If your insurer covers this, then call to schedule appointment to have this vaccine here.  Quit smoking

## 2017-03-30 ENCOUNTER — Other Ambulatory Visit: Payer: Self-pay | Admitting: Medical

## 2017-03-30 ENCOUNTER — Other Ambulatory Visit: Payer: Self-pay

## 2017-03-30 DIAGNOSIS — M1712 Unilateral primary osteoarthritis, left knee: Secondary | ICD-10-CM

## 2017-03-30 DIAGNOSIS — M21371 Foot drop, right foot: Secondary | ICD-10-CM

## 2017-03-30 DIAGNOSIS — M545 Low back pain, unspecified: Secondary | ICD-10-CM

## 2017-03-30 LAB — COMPREHENSIVE METABOLIC PANEL
ALBUMIN: 4.4 g/dL (ref 3.6–5.1)
ALT: 16 U/L (ref 9–46)
AST: 16 U/L (ref 10–35)
Alkaline Phosphatase: 61 U/L (ref 40–115)
BILIRUBIN TOTAL: 0.9 mg/dL (ref 0.2–1.2)
BUN: 17 mg/dL (ref 7–25)
CALCIUM: 9.7 mg/dL (ref 8.6–10.3)
CO2: 23 mmol/L (ref 20–32)
CREATININE: 0.87 mg/dL (ref 0.70–1.33)
Chloride: 104 mmol/L (ref 98–110)
Glucose, Bld: 89 mg/dL (ref 65–99)
Potassium: 3.9 mmol/L (ref 3.5–5.3)
SODIUM: 141 mmol/L (ref 135–146)
Total Protein: 7 g/dL (ref 6.1–8.1)

## 2017-03-30 LAB — LIPID PANEL
CHOL/HDL RATIO: 2.9 ratio (ref <5.0)
CHOLESTEROL: 163 mg/dL (ref <200)
HDL: 56 mg/dL (ref >40)
LDL Cholesterol: 87 mg/dL (ref <100)
Triglycerides: 101 mg/dL (ref <150)
VLDL: 20 mg/dL (ref <30)

## 2017-03-30 LAB — MICROALBUMIN / CREATININE URINE RATIO
Creatinine, Urine: 293 mg/dL (ref 20–370)
MICROALB/CREAT RATIO: 7 ug/mg{creat} (ref <30)
Microalb, Ur: 2 mg/dL

## 2017-03-30 LAB — PSA: PSA: 2.5 ng/mL (ref <=4.0)

## 2017-03-30 LAB — HEMOGLOBIN A1C
HEMOGLOBIN A1C: 5.6 % (ref <5.7)
MEAN PLASMA GLUCOSE: 114 mg/dL

## 2017-03-30 MED ORDER — ATORVASTATIN CALCIUM 20 MG PO TABS: ORAL_TABLET | ORAL | 3 refills | Status: DC

## 2017-03-30 MED ORDER — LISINOPRIL-HYDROCHLOROTHIAZIDE 10-12.5 MG PO TABS: 1.0000 | ORAL_TABLET | Freq: Every day | ORAL | 3 refills | Status: DC

## 2017-03-30 MED ORDER — VALACYCLOVIR HCL 500 MG PO TABS: ORAL_TABLET | ORAL | 2 refills | Status: DC

## 2017-04-06 ENCOUNTER — Telehealth: Payer: Self-pay | Admitting: Medical

## 2017-04-06 NOTE — Telephone Encounter (Signed)
Please call patient, he is returning your call  If  unable  To reach on cell, please also try work #  Work # [phone]  Pt not sure if he wants to see [facility], so he may change to different ortho but not sure. He will let us know

## 2017-04-07 ENCOUNTER — Ambulatory Visit (INDEPENDENT_AMBULATORY_CARE_PROVIDER_SITE_OTHER): Payer: BC Managed Care – PPO | Admitting: Orthopaedic Surgery

## 2017-04-10 NOTE — Telephone Encounter (Signed)
Pt called back and said he would like to change ortho. And will call us back with name of the doctor.

## 2017-04-10 NOTE — Telephone Encounter (Signed)
Called pt he was at work and he was going call us back.

## 2017-04-18 ENCOUNTER — Telehealth: Payer: Self-pay | Admitting: Medical

## 2017-04-18 NOTE — Telephone Encounter (Signed)
  Dr. Rhina Brackett  [phone] Is the ortho that he wants to be referred to  Please call

## 2017-04-19 NOTE — Telephone Encounter (Signed)
What is dx for him I didn't any thing in the office notes?

## 2017-04-19 NOTE — Telephone Encounter (Signed)
Left knee OA (osteoarthritis), chronic knee pain

## 2017-04-19 NOTE — Telephone Encounter (Signed)
What is reason for the referral  Didn't see a dx in the office note ?

## 2017-04-19 NOTE — Telephone Encounter (Signed)
Ok, please refer 

## 2017-04-20 NOTE — Telephone Encounter (Signed)
Sent referral 

## 2017-05-24 ENCOUNTER — Encounter (HOSPITAL_COMMUNITY): Payer: Self-pay | Admitting: Family Medicine

## 2017-05-24 ENCOUNTER — Ambulatory Visit (HOSPITAL_COMMUNITY)
Admission: EM | Admit: 2017-05-24 | Discharge: 2017-05-24 | Disposition: A | Discharge status: Home / Self Care | Payer: BC Managed Care – PPO | Attending: Family Medicine | Admitting: Family Medicine

## 2017-05-24 DIAGNOSIS — G8929 Other chronic pain: Secondary | ICD-10-CM

## 2017-05-24 DIAGNOSIS — M545 Low back pain, unspecified: Secondary | ICD-10-CM

## 2017-05-24 MED ORDER — CYCLOBENZAPRINE HCL 5 MG PO TABS: 5.0000 mg | ORAL_TABLET | Freq: Three times a day (TID) | ORAL | 0 refills | Status: DC | PRN

## 2017-05-24 NOTE — Discharge Instructions (Signed)
We discussed over the counter tylenol 650 mg every 6 hours as needed you can alternate with ibuprofen every 6 hours as needed as well. Other things you can try at home are heating pads and stretches. Please try 1-2 tablets of the muscle relaxer flexeril as needed and follow up with your primary care doctor.

## 2017-05-24 NOTE — ED Provider Notes (Signed)
[facility]    CSN: [id number] Arrival date & time: 05/24/17  1539     History   Chief Complaint Chief Complaint  Patient presents with  . Back Pain    HPI James Bird is a 56 y.o. male.   Patient is a 56 yo M who has chronic low back pain greater on her R side. States his back pain occasionally flares up intermittently. This episode started on Saturday or Sunday and initially felt similar to past episodes which has self resolved but worsened. Trigger may have been heavy heavy objects at work. States resting has helped. Has take OTC tylenol 1-2 times per day which has also had some benefit. Does not radiate. No falls or injury or trauma. No bowel or bladder incontinence. Does endorse a slight R foot drop which is also chronic for him. No bowel or bladder incontinence.      Past Medical History:  Diagnosis Date  . Allergy    RHINITS  . Chronic shoulder pain    left  . Diverticulosis   . ED (erectile dysfunction)   . H/O echocardiogram 03/20/08   mild - moderate LVH, EF 50-55%, mild RV enlargement, mild mitral regurgitation, Dr. Verlon Setting  . 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 History of cardiovascular stress test 03/20/2008   dobutamine stress test, normal study, Dr. Verlon Setting  . History of neck surgery   . Hyperlipidemia   . Hypertension   . Impaired fasting glucose   . Smoker     Patient Active Problem List   Diagnosis Date Noted  . Osteoarthritis of left knee 03/29/2017  . Clonus 03/29/2017  . Right foot drop 03/29/2017  . Vaccine counseling 03/29/2017  . Screening for prostate cancer 12/14/2015  . Right-sided low back pain without sciatica 12/14/2015  . Obesity 12/11/2014  . Hyperlipidemia 11/28/2012  . Erectile dysfunction 11/28/2012  . Essential hypertension, benign 11/02/2011  . Impaired fasting blood sugar 11/02/2011  . Tobacco use disorder 11/02/2011    Past Surgical History:  Procedure Laterality Date  . cdisc & fusion  2011   Dr. Arnoldo Morale  . COLONOSCOPY  10/2010   diverticulosis, othewrise normal, repeat 10 years, Dr. Henrene Pastor  . SPINE SURGERY  2011(JENKINS,MD)   CERVICAL DISC AND FUSION       Home Medications    Prior to Admission medications   Medication Sig Start Date End Date Taking? Authorizing Provider  atorvastatin (LIPITOR) 20 MG tablet TAKE 1 TABLET(20 MG) BY MOUTH DAILY AT 6 PM 03/30/17   Tysinger, Camelia Eng, PA-C  lisinopril-hydrochlorothiazide (PRINZIDE,ZESTORETIC) 10-12.5 MG tablet Take 1 tablet by mouth daily. 03/30/17   Tysinger, Camelia Eng, PA-C  sildenafil (VIAGRA) 100 MG tablet Take 1 tablet (100 mg total) by mouth as needed for erectile dysfunction. 12/12/14   Tysinger, Camelia Eng, PA-C  valACYclovir (VALTREX) 500 MG tablet 1 tablet po BID x 3 days for flare up 03/30/17   Tysinger, Camelia Eng, PA-C  varenicline (CHANTIX STARTING MONTH PAK) 0.5 MG X 11 & 1 MG X 42 tablet Take one 0.5 mg tablet by mouth once daily for 3 days, then increase to one 0.5 mg tablet twice daily for 4 days, then increase to one 1 mg tablet twice daily. 03/29/17   Tysinger, Camelia Eng, PA-C    Family History Family History  Problem Relation Age of Onset  . Cancer Mother        LUNG  . Heart disease Father  MI  . Cancer Paternal Aunt   . Cancer Paternal Uncle   . Hypertension Brother   . Diabetes Neg Hx   . Stroke Neg Hx     Social History Social History  Substance Use Topics  . Smoking status: Current Every Day Smoker    Packs/day: 0.50    Years: 16.00    Types: Cigarettes  . Smokeless tobacco: Never Used  . Alcohol use 6.0 oz/week    10 Cans of beer per week     Comment: 10 drinks per week, sometimes more     Allergies   Patient has no known allergies.   Review of Systems Review of Systems  Respiratory: Negative for chest tightness.   Cardiovascular: Negative for chest pain.  Gastrointestinal: Negative for abdominal pain, constipation and diarrhea.  Genitourinary: Negative for difficulty urinating.    Musculoskeletal: Positive for back pain and gait problem. Negative for arthralgias, myalgias, neck pain and neck stiffness.  Skin: Negative for rash.  Neurological: Negative for weakness, numbness and headaches.     Physical Exam Triage Vital Signs ED Triage Vitals  Enc Vitals Group     BP 05/24/17 1554 (!) 158/98     Pulse Rate 05/24/17 1554 70     Resp 05/24/17 1554 18     Temp --      Temp src --      SpO2 05/24/17 1554 97 %     Weight --      Height --      Head Circumference --      Peak Flow --      Pain Score 05/24/17 1553 6     Pain Loc --      Pain Edu? --      Excl. in [place]? --    No data found.   Updated Vital Signs BP (!) 158/98   Pulse 70   Resp 18   SpO2 97%   Visual Acuity Right Eye Distance:   Left Eye Distance:   Bilateral Distance:    Right Eye Near:   Left Eye Near:    Bilateral Near:     Physical Exam  Constitutional: He is oriented to person, place, and time. He appears well-developed and well-nourished. No distress.  HENT:  Head: Normocephalic and atraumatic.  Musculoskeletal: Normal range of motion. He exhibits tenderness (TTP over R SI joint and R lumbar paraspinal muscles). He exhibits no edema or deformity.  Neurological: He is alert and oriented to person, place, and time. He displays normal reflexes. No sensory deficit. He exhibits normal muscle tone. Coordination normal.  Normal gait  Skin: Skin is warm and dry. Capillary refill takes less than 2 seconds. No rash noted.  Psychiatric: He has a normal mood and affect.     UC Treatments / Results  Labs (all labs ordered are listed, but only abnormal results are displayed) Labs Reviewed - No data to display  EKG  EKG Interpretation None       Radiology No results found.  Procedures Procedures (including critical care time)  Medications Ordered in UC Medications - No data to display   Initial Impression / Assessment and Plan / UC Course  I have reviewed the triage  vital signs and the nursing notes.  Pertinent labs & imaging results that were available during my care of the patient were reviewed by me and considered in my medical decision making (see chart for details).   Patient is a 56 yo M who presented  to urgent care with acute flare of his chronic R lower back pain. No red flags on history or exam. Patient likely has acute muscle strain from heavy lifting at work. Per chart review it appears that he has been referred to orthopedics in the past for imaging but he had canceled this appointment. Discussed follow up with PCP for this and for chronic management. Advised symptomatic management with OTC tylenol and ibuprofen, heating pads and provided patient with exercises/stretches.    Final Clinical Impressions(s) / UC Diagnoses   Final diagnoses:  Chronic right-sided low back pain without sciatica    New Prescriptions Discharge Medication List as of 05/24/2017  4:13 PM    START taking these medications   Details  cyclobenzaprine (FLEXERIL) 5 MG tablet Take 1 tablet (5 mg total) by mouth 3 (three) times daily as needed for muscle spasms., Starting Wed 05/24/2017, Normal          Bufford Lope, DO 05/24/17 1710

## 2017-05-24 NOTE — ED Triage Notes (Signed)
Pt here for mid/lower back pain. Denies injury.

## 2017-06-05 ENCOUNTER — Encounter: Payer: Self-pay | Admitting: Family Medicine

## 2017-06-05 ENCOUNTER — Ambulatory Visit (INDEPENDENT_AMBULATORY_CARE_PROVIDER_SITE_OTHER): Payer: BC Managed Care – PPO | Admitting: Family Medicine

## 2017-06-05 VITALS — BP 150/100 | HR 88 | Ht 71.0 in | Wt 237.6 lb

## 2017-06-05 DIAGNOSIS — F17200 Nicotine dependence, unspecified, uncomplicated: Secondary | ICD-10-CM | POA: Diagnosis not present

## 2017-06-05 DIAGNOSIS — M545 Low back pain: Secondary | ICD-10-CM | POA: Diagnosis not present

## 2017-06-05 DIAGNOSIS — I10 Essential (primary) hypertension: Secondary | ICD-10-CM

## 2017-06-05 DIAGNOSIS — Z23 Encounter for immunization: Secondary | ICD-10-CM

## 2017-06-05 DIAGNOSIS — M1712 Unilateral primary osteoarthritis, left knee: Secondary | ICD-10-CM | POA: Diagnosis not present

## 2017-06-05 DIAGNOSIS — Z981 Arthrodesis status: Secondary | ICD-10-CM

## 2017-06-05 DIAGNOSIS — M21371 Foot drop, right foot: Secondary | ICD-10-CM

## 2017-06-05 LAB — POCT URINALYSIS DIP (PROADVANTAGE DEVICE)
BILIRUBIN UA: NEGATIVE
Blood, UA: NEGATIVE
GLUCOSE UA: NEGATIVE mg/dL
Ketones, POC UA: NEGATIVE mg/dL
LEUKOCYTES UA: NEGATIVE
NITRITE UA: NEGATIVE
Specific Gravity, Urine: 1.025
UUROB: NEGATIVE
pH, UA: 6 (ref 5.0–8.0)

## 2017-06-05 MED ORDER — MELOXICAM 15 MG PO TABS: ORAL_TABLET | ORAL | 0 refills | Status: DC

## 2017-06-05 MED ORDER — KETOROLAC TROMETHAMINE 60 MG/2ML IM SOLN
60.0000 mg | Freq: Once | INTRAMUSCULAR | Status: AC
  Administered 2017-06-05: 60 mg via INTRAMUSCULAR

## 2017-06-05 NOTE — Progress Notes (Signed)
Chief Complaint  Patient presents with  . Fall    tripped and fell over shoes in the hallway last night and hurt lower back. Can't move his left leg and when he tries it hurts his leg his back.    Last night he fell in the hallway, landed on his left side (left forearm is a little sore, where he landed).  He felt okay last night, but when he woke up today he was having back pain. Pain is in the center of his back, radiating slightly to the left.  He is having numbness/tingling in the left leg, and if he tries to move the leg, he gets increased pain in his back.  He has some pain at the lateral hip and thigh--deep inside, pain with movement, not sore to touch.  Current pain is 9/10.  He hasn't taken anything for pain today.  Hasn't tried heat/ice, just laid down and tried to sleep.    He has had some more chronic problems with low back pain; his prior visits were reviewed in detail.  Most recently, he had UC visit 10/10 for LBP. He was tender at R SI joint and R lumbar paraspinal muscles, and was felt to have a muscle strain. He was prescribed flexeril 5mg .  He reports he never took it, and that pain has resolved. He felt the muscular part was "a small part of his pain", knew that would get better, so never even started it. He did fill the prescription, has at home. He had done some heavy lifting at work. He has a slight R foot drop which is chronic, addressed by both Audelia Acton and Dr. Redmond School (and f/u with neurosurgeon has been recommended). He has h/o c-spine surgery/fusion in 2011 by Dr. Arnoldo Morale. He saw Dr. Redmond School in 07/2016, at which time MR C-spine was ordered, and he was recommended to f/u with Dr. Arnoldo Morale.  This follow-up was also recommended by Audelia Acton at his physical in August 2018; abnormal prior L-S MRI had also been noted.  He has been unable to get in with the neurosurgeon--"it has been an ordeal".  Apparently he needs referral back there.  He was able to get scheduled for a visit in January,  without any info or referral from our office.   He had MRI lumbar spine in 12/2011 showed: IMPRESSION: Degenerative changes throughout the lumbar spine most prominent on the left at the L1-2 level as detailed above. In this patient who is presenting with right-sided symptoms, lateral extension of disc at L2-3 through L5-S1 level approaches or touching the exiting nerve root which does not appear to be significantly compressed as detailed above.  Mild degenerative changes at SI joints had also been noted  He saw Dr. Rip Harbour for his left knee pain. He had a cortisone shot which provided temporary help. He has not arranged follow-up, is having recurrent pain.  Quit smoking 05/15/17.  He reports he used Chantix, as prescribed by Audelia Acton (last saw Audelia Acton for CPE 03/29/17).  He never even completed the starter pack, and has admitted sneaking a couple.  PMH, PSH, SH reviewed in detail, including all recent labs, imaging  Outpatient Encounter Prescriptions as of 06/05/2017  Medication Sig  . atorvastatin (LIPITOR) 20 MG tablet TAKE 1 TABLET(20 MG) BY MOUTH DAILY AT 6 PM  . lisinopril-hydrochlorothiazide (PRINZIDE,ZESTORETIC) 10-12.5 MG tablet Take 1 tablet by mouth daily.  . cyclobenzaprine (FLEXERIL) 5 MG tablet Take 1 tablet (5 mg total) by mouth 3 (three) times daily as needed  for muscle spasms. (Patient not taking: Reported on 06/05/2017)  . meloxicam (MOBIC) 15 MG tablet Take 1 tablet by mouth once daily with food, regularly until pain has resolved  . sildenafil (VIAGRA) 100 MG tablet Take 1 tablet (100 mg total) by mouth as needed for erectile dysfunction. (Patient not taking: Reported on 06/05/2017)  . valACYclovir (VALTREX) 500 MG tablet 1 tablet po BID x 3 days for flare up (Patient not taking: Reported on 06/05/2017)  . [DISCONTINUED] varenicline (CHANTIX STARTING MONTH PAK) 0.5 MG X 11 & 1 MG X 42 tablet Take one 0.5 mg tablet by mouth once daily for 3 days, then increase to one 0.5 mg  tablet twice daily for 4 days, then increase to one 1 mg tablet twice daily.  . [EXPIRED] ketorolac (TORADOL) injection 60 mg    No facility-administered encounter medications on file as of 06/05/2017.    (toradol and meloxicam tx'd today, not taking any pain meds prior to visit).  No Known Allergies  ROS:  No fever, chills, headaches, dizziness. +numbness and tingling down the lateral aspect of the left leg, from hip down to foot.  Denies weakness on the left (chronic foot drop, mild, on right).  Pain with movement of the left leg/hip (pain in back).  No bowel or bladder dysfunction. No bleeding, bruising, rash. Some pain in the left neck and left shoulder.  See HPI   PHYSICAL EXAM:  BP (!) 150/104   Pulse 88   Ht 5\' 11"  (1.803 m)   Wt 237 lb 9.6 oz (107.8 kg)   BMI 33.14 kg/m   Well appearing, pleasant male, with cane. In moderate distress when he tries to move left leg Neck: no lymphadenopathy or mass Heart: regular rate and rhythm Lungs: clear Back: No spinal tenderness.  Slight tenderness at right SI joint, more a little more tender just medial to the SI joint.  nontender midline. He is tender over the sciatic notch and diffusely in the deep buttock muscles. He has pain even with dorsiflexion of toes (without moving rest of leg) Normal strength throughout left lower extremity-- Unable to test hip flexion due to pain in back (he refused to try) He has negative straight leg raise (when passively raised by me, no pain) Normal DTR's at knees and ankles (symmetric, rather brisk at both ankles).  Subjective different sensation "numbness" along the lateral left leg from the hip down the lateral calf (not dermatomal). nontender to palpation over trochanteric bursa or IT band. Skin: no bruising, swelling, rash Psych: normal mood, affect, hygiene and grooming   ASSESSMENT/PLAN:  Low back pain, unspecified back pain laterality, unspecified chronicity, with sciatica presence  unspecified - suspect some muscle spasm and sciatica, cannot r/o radiculopathy--exam limited by pain - Plan: POCT Urinalysis DIP (Proadvantage Device), meloxicam (MOBIC) 15 MG tablet, ketorolac (TORADOL) injection 60 mg, Ambulatory referral to Neurosurgery  Need for influenza vaccination - Plan: Flu Vaccine QUAD 6+ mos PF IM (Fluarix Quad PF)  Essential hypertension, benign - elevated today--not sure how much is related to pain, since BP not checked elsewhere; check BP elsewhere and f/u if persistently high  Tobacco use disorder - recently quit, but at risk for relapse--didn't use Chantix long enough. Encouraged to use for 3 mos (since no side effects)  Right foot drop - chronic, with some chronic back pain (separate from today's acute problem) and neck pain--refer back to f/u with Dr. Arnoldo Morale - Plan: Ambulatory referral to Neurosurgery  Arthritis of left knee - pain  recurrent after steroid shot.  recommended he f/u with ortho; encouraged non-weight-bearing exercise (bike), and weight loss  History of fusion of cervical spine - Plan: Ambulatory referral to Neurosurgery  Referred back to Dr. Loree Fee back pain, right foot drop; he had prior neck surgery and has some left sided neck/shoulder pain. Hopefully this will help him get appointment sooner than his one scheduled for January.  >45 min visit, more than 1/2 spent counseling (re: risks/side effects of meds, high blood pressure, smoking cessation, red flags with back pain, etc)    Try heat vs ice vs alternating (whichever seems to help the most). We gave you a shot of toradol today.  Be sure to wait 6 hours before taking any other anti-inflammatory medication. Use the prescribed anti-inflammatory as directed. Do not take any other over-the-counter pain medications (no advil, aleve, GoodyBC, Motrin, aspirin). You MAY use tylenol products along with the prescription medication. Take the medication with food, and decrease the dose if it  bothers you stomach. Try to do the stretches to the left hip/buttock as shown.  There may be a component of muscle spasm in the back and buttocks that may be contributing to a pinched nerve.  Use the Flexeril that you were prescribed at the urgent care.  Take 1-2 (5-10mg ) every 8 hours, as needed.  This will cause a lot of drowsiness, so use with caution and don't drive.  Call for a stronger pain medication if this isn't helping. (tramadol can be phoned in; others need a written prescription).  These stronger medications can potentially cause sedation. Return in a week if not improving, sooner if worse. It is possible that physical therapy may be needed. If you are having problems with your bowels or bladder, if you have increasing weakness or numbness or foot-drop or other problems, please get re-evaluated right away.   Please continue to take the Chantix regularly.  If you do not have another prescription at the pharmacy for the continuation pack, have the pharmacy contact [place].  It is supposed to be a 3 month course.  Please finish all 3 months, unless you have side effects.  Please periodically check your blood pressure elsewhere. You cannot feel when it is high, and you are obviously in pain which may be contributing to the high blood pressure today.  We need to know if it is chronically running high, in which case you need to have your medications adjusted.  Having elevated blood pressures can cause a lot of problems (heart, kidney, etc). Goal blood pressure is <130/80 (actually, goal is 120/70).  If consistently >135/85, please return to discuss with [facility] or Dr. Redmond School.  Follow up with the orthopedist if your knee pain is not adequately controlled.

## 2017-06-05 NOTE — Patient Instructions (Addendum)
   Try heat vs ice vs alternating (whichever seems to help the most). We gave you a shot of toradol today.  Be sure to wait 6 hours before taking any other anti-inflammatory medication. Use the prescribed anti-inflammatory as directed. Do not take any other over-the-counter pain medications (no advil, aleve, GoodyBC, Motrin, aspirin). You MAY use tylenol products along with the prescription medication. Take the medication with food, and decrease the dose if it bothers you stomach. Try to do the stretches to the left hip/buttock as shown.  There may be a component of muscle spasm in the back and buttocks that may be contributing to a pinched nerve.  Use the Flexeril that you were prescribed at the urgent care.  Take 1-2 (5-10mg ) every 8 hours, as needed.  This will cause a lot of drowsiness, so use with caution and don't drive.  Call for a stronger pain medication if this isn't helping. (tramadol can be phoned in; others need a written prescription).  These stronger medications can potentially cause sedation. Return in a week if not improving, sooner if worse. It is possible that physical therapy may be needed. If you are having problems with your bowels or bladder, if you have increasing weakness or numbness or foot-drop or other problems, please get re-evaluated right away.    Please continue to take the Chantix regularly.  If you do not have another prescription at the pharmacy for the continuation pack, have the pharmacy contact [place].  It is supposed to be a 3 month course.  Please finish all 3 months, unless you have side effects.  Please periodically check your blood pressure elsewhere. You cannot feel when it is high, and you are obviously in pain which may be contributing to the high blood pressure today.  We need to know if it is chronically running high, in which case you need to have your medications adjusted.  Having elevated blood pressures can cause a lot of problems (heart, kidney,  etc). Goal blood pressure is <130/80 (actually, goal is 120/70).  If consistently >135/85, please return to discuss with [facility] or Dr. Redmond School.  Follow up with the orthopedist if your knee pain is not adequately controlled.     Please continue to take the Chantix regularly.  If you do not have another prescription at the pharmacy for the continuation pack, have the pharmacy contact [place].  It is supposed to be a 3 month course.  Please finish all 3 months, unless you have side effects.  Please periodically check your blood pressure elsewhere. You cannot feel when it is high, and you are obviously in pain which may be contributing to the high blood pressure today.  We need to know if it is chronically running high, in which case you need to have your medications adjusted.  Having elevated blood pressures can cause a lot of problems (heart, kidney, etc). Goal blood pressure is <130/80 (actually, goal is 120/70).  If consistently >135/85, please return to discuss with [facility] or Dr. Redmond School.  Follow up with the orthopedist if your knee pain is not adequately controlled.

## 2017-06-18 ENCOUNTER — Other Ambulatory Visit: Payer: Self-pay | Admitting: Medical

## 2017-06-19 ENCOUNTER — Telehealth: Payer: Self-pay | Admitting: *Deleted

## 2017-06-19 NOTE — Telephone Encounter (Signed)
Pt scheduled for 06/19/2017 RLB

## 2017-06-19 NOTE — Telephone Encounter (Signed)
Have him come back in follow-up with me

## 2017-06-19 NOTE — Telephone Encounter (Signed)
Becky @ Dr.Jenkins office called [phone] and said that Dr.Jenkins cannot get patient in any earlier at the moment, he currently has an appt 09/08/17-he is putting him on a waiting list. In the meantime wants to know if you will order MRI to try to explain his drop foot?

## 2017-06-20 ENCOUNTER — Ambulatory Visit: Payer: BC Managed Care – PPO | Admitting: Family Medicine

## 2017-06-20 ENCOUNTER — Encounter: Payer: Self-pay | Admitting: Family Medicine

## 2017-06-20 ENCOUNTER — Telehealth: Payer: Self-pay

## 2017-06-20 VITALS — BP 170/90 | HR 87 | Resp 18 | Wt 241.4 lb

## 2017-06-20 DIAGNOSIS — M545 Low back pain: Secondary | ICD-10-CM

## 2017-06-20 NOTE — Telephone Encounter (Signed)
Pt needs authorization for MRI first thing Wednesday morning per [place]. James Bird

## 2017-06-20 NOTE — Progress Notes (Signed)
   Subjective:    Patient ID: James Bird, male    DOB: Nov 21, 1960, 56 y.o.   MRN: [id number]  HPI He is here for consult concerning continued difficulty with low back pain. He has a very long history of difficulty with low back pain as well as cervical pain. He has had surgery on his neck. He was seen approximately one year ago and at that time had evidence of clonus as well as foot drop. He was supposed to get an MRI but to some confusion it was not done. He did attempt follow-up but apparently fell through the cracks and cannot follow up enough to get further testing done. Recently he did reinjure his back. He states that now it is roughly a 2 out of 10 except when he moves. Review of the record indicates he did have an MRI on his back in 2013. He apparently does have a scheduled appointment with his neurosurgeon in January. Review of Systems     Objective:   Physical Exam Alert and in minimal distress. No palpable tenderness to his back. Straight leg raising was questionable at 70 with negative straight leg raising. He does have 4 beat clonus. Right foot drop is noted with walking. DTRs are 2-3+ on the right and normal on the left.       Assessment & Plan:  Low back pain, unspecified back pain laterality, unspecified chronicity, with sciatica presence unspecified - Plan: MR LUMBAR SPINE WO CONTRAST

## 2017-06-21 NOTE — Telephone Encounter (Signed)
Prior approval needs peer to peer for MRI c-spine and l-spine without contrast.  Please call [phone], using member ID # [id number].   MAKE SURE TO GET AUTHORIZATION # BACK TO ME PLEASE SO I CAN NOTIFY [facility].    Thanks, Wells Guiles

## 2017-06-21 NOTE — Telephone Encounter (Signed)
The order should've been just for MRI of lumbar spine

## 2017-06-22 NOTE — Telephone Encounter (Signed)
Called and spoke with nurse reviewer, AMY- cancelled auth for C-Spine MRI. MRI L-Spine authorized from 11/7-12/6 with auth # [id number]. Provided this information to [employer].

## 2017-06-22 NOTE — Telephone Encounter (Signed)
C-Spine MRI from 2017, will cancel the authorization for this test and reobtain auth on MRI L Spine. Victorino December

## 2017-06-27 ENCOUNTER — Other Ambulatory Visit: Payer: Self-pay | Admitting: Family Medicine

## 2017-06-27 DIAGNOSIS — M545 Low back pain: Secondary | ICD-10-CM

## 2017-06-27 NOTE — Telephone Encounter (Signed)
Is this okay to refill? 

## 2017-06-27 NOTE — Telephone Encounter (Signed)
Ok to refill 

## 2017-06-30 ENCOUNTER — Telehealth: Payer: Self-pay | Admitting: Family Medicine

## 2017-06-30 ENCOUNTER — Other Ambulatory Visit: Payer: Self-pay | Admitting: Medical

## 2017-06-30 DIAGNOSIS — M21371 Foot drop, right foot: Secondary | ICD-10-CM

## 2017-06-30 DIAGNOSIS — R258 Other abnormal involuntary movements: Secondary | ICD-10-CM

## 2017-06-30 MED ORDER — VARENICLINE TARTRATE 1 MG PO TABS: 1.0000 mg | ORAL_TABLET | Freq: Two times a day (BID) | ORAL | 0 refills | Status: DC

## 2017-06-30 MED ORDER — ALPRAZOLAM 0.25 MG PO TABS: 0.2500 mg | ORAL_TABLET | Freq: Two times a day (BID) | ORAL | 0 refills | Status: DC | PRN

## 2017-06-30 NOTE — Telephone Encounter (Signed)
Pt scheduled for MRI tomorrow per Dr Redmond School. Requesting med to help calm for the MRI. Pt said Dr Redmond School has given a med for this in the past since he is claustrophobic.   Also pt requesting refill on Chantix. He said Dr Tomi Bamberger asked him last month to continue taking Chantix but pharmacy told pt that he needs more refills.

## 2017-06-30 NOTE — Telephone Encounter (Signed)
Call in xanax

## 2017-07-01 ENCOUNTER — Inpatient Hospital Stay: Admission: RE | Admit: 2017-07-01 | Payer: BC Managed Care – PPO | Source: Ambulatory Visit

## 2017-07-01 ENCOUNTER — Ambulatory Visit
Admission: RE | Admit: 2017-07-01 | Discharge: 2017-07-01 | Disposition: A | Discharge status: Home / Self Care | Payer: BC Managed Care – PPO | Source: Ambulatory Visit | Attending: Family Medicine | Admitting: Family Medicine

## 2017-07-01 DIAGNOSIS — M545 Low back pain: Secondary | ICD-10-CM

## 2017-07-01 NOTE — Telephone Encounter (Signed)
Called into pharmacy & called pt and informed

## 2017-08-11 ENCOUNTER — Ambulatory Visit: Payer: BC Managed Care – PPO | Admitting: Medical

## 2017-08-11 ENCOUNTER — Telehealth: Payer: Self-pay | Admitting: Medical

## 2017-08-11 ENCOUNTER — Encounter: Payer: Self-pay | Admitting: Medical

## 2017-08-11 VITALS — BP 128/86 | HR 99 | Wt 236.2 lb

## 2017-08-11 DIAGNOSIS — M7552 Bursitis of left shoulder: Secondary | ICD-10-CM | POA: Diagnosis not present

## 2017-08-11 DIAGNOSIS — M25512 Pain in left shoulder: Secondary | ICD-10-CM

## 2017-08-11 DIAGNOSIS — M545 Low back pain: Secondary | ICD-10-CM

## 2017-08-11 MED ORDER — MELOXICAM 15 MG PO TABS: 15.0000 mg | ORAL_TABLET | Freq: Every day | ORAL | 0 refills | Status: DC

## 2017-08-11 MED ORDER — HYDROCODONE-ACETAMINOPHEN 5-325 MG PO TABS: 1.0000 | ORAL_TABLET | Freq: Four times a day (QID) | ORAL | 0 refills | Status: DC | PRN

## 2017-08-11 NOTE — Telephone Encounter (Signed)
Please send a copy of his recent lumbar MRI to Dr. Arnoldo Morale at [facility] neurosurgery.   he reportedly has a consult appointment with him.  Please verify

## 2017-08-11 NOTE — Progress Notes (Signed)
Subjective  Chief Complaint  Patient presents with  . Shoulder Pain    left shoulder , pain started yesterday  thinks he slept on it wrong    here for complaint of left shoulder pain times 1-2 days.  He denies any recent injury trauma or fall.  Thinks he may have slept on it wrong.  Has pain in the front of the shoulder, pain lifting shoulder above 70 degrees.  No neck pain no arm numbness or tinglings  or weakness.  Has had problems with his shoulders in the past.  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   He notes that he had an MRI of his lumbar spine done recently has not heard back from results.  He is supposed to consult with Dr. Arnoldo Morale at [facility] neurosurgery  Past Medical History:  Diagnosis Date  . Allergy    RHINITS  . Chronic shoulder pain    left  . Diverticulosis   . ED (erectile dysfunction)   . H/O echocardiogram 03/20/08   mild - moderate LVH, EF 50-55%, mild RV enlargement, mild mitral regurgitation, Dr. Verlon Setting  . 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 History of cardiovascular stress test 03/20/2008   dobutamine stress test, normal study, Dr. Verlon Setting  . History of neck surgery   . Hyperlipidemia   . Hypertension   . Impaired fasting glucose   . Smoker    Current Outpatient Medications on File Prior to Visit  Medication Sig Dispense Refill  . atorvastatin (LIPITOR) 20 MG tablet TAKE 1 TABLET(20 MG) BY MOUTH DAILY AT 6 PM 90 tablet 3  . lisinopril-hydrochlorothiazide (PRINZIDE,ZESTORETIC) 10-12.5 MG tablet Take 1 tablet by mouth daily. 90 tablet 3  . sildenafil (VIAGRA) 100 MG tablet Take 1 tablet (100 mg total) by mouth as needed for erectile dysfunction. 6 tablet 3  . valACYclovir (VALTREX) 500 MG tablet 1 tablet po BID x 3 days for flare up 30 tablet 2  . varenicline (CHANTIX CONTINUING MONTH PAK) 1 MG tablet Take 1 tablet (1 mg total) 2 (two) times daily by mouth. 60 tablet 0   No  current facility-administered medications on file prior to visit.    Review of systems as in subjective    Objective BP 128/86   Pulse 99   Wt 236 lb 3.2 oz (107.1 kg)   SpO2 96%   BMI 32.94 kg/m   Gen: wd, wn, nad He is tender over the anterior shoulder and biceps origin, pain with passive and active range of motion with flexion and abduction of the shoulder over 70 degrees.  Pain with crossover test, internal and external range of motion reduced.  No laxity.  No swelling. Neck nontender, normal range of motion, no mass  back nontender Arms neurovascularly intact No chest wall tenderness or deformity    Assessment:  Encounter Diagnoses  Name Primary?  . Acute pain of left shoulder Yes  . Bursitis of left shoulder   . Low back pain, unspecified back pain laterality, unspecified chronicity, with sciatica presence unspecified     Plan: We discussed his shoulder pain and exam findings.  Symptoms and exam suggest acute bursitis.  Advised using arm sling for an hour at a time, gentle stretching, ice 2-3 times daily for 20 minutes at a time, meloxicam short-term, Norco if needed short-term.  Symptoms should gradually resolve over the next week to  10 days  If worse or not improving return  Regarding his back MRI, I reviewed the results, and we will send along a copy of the results to neurosurgery.  This was ordered by Dr. Redmond School here, and he already has consult appointment pending  James Bird was seen today for shoulder pain.  Diagnoses and all orders for this visit:  Acute pain of left shoulder  Bursitis of left shoulder  Low back pain, unspecified back pain laterality, unspecified chronicity, with sciatica presence unspecified Comments: suspect some muscle spasm and sciatica, cannot r/o radiculopathy--exam limited by pain Orders: -     meloxicam (MOBIC) 15 MG tablet; Take 1 tablet (15 mg total) by mouth daily.  Other orders -     HYDROcodone-acetaminophen (NORCO) 5-325  MG tablet; Take 1 tablet by mouth every 6 (six) hours as needed for moderate pain.

## 2017-08-14 NOTE — Telephone Encounter (Signed)
Called and l/m for pt call us back.  

## 2017-08-15 DIAGNOSIS — K5900 Constipation, unspecified: Secondary | ICD-10-CM

## 2017-08-15 HISTORY — DX: Constipation, unspecified: K59.00

## 2017-09-07 ENCOUNTER — Other Ambulatory Visit: Payer: Self-pay | Admitting: Medical

## 2017-09-07 DIAGNOSIS — M545 Low back pain: Secondary | ICD-10-CM

## 2017-09-07 NOTE — Telephone Encounter (Signed)
Is this okay to refill? 

## 2017-09-18 ENCOUNTER — Telehealth: Payer: Self-pay | Admitting: Medical

## 2017-09-18 NOTE — Telephone Encounter (Signed)
Reminder - plan to limit or reduce Meloxicam given cardiovascular risk.   He should be seeing specialist now for ortho concern.

## 2017-09-20 ENCOUNTER — Telehealth: Payer: Self-pay | Admitting: Family Medicine

## 2017-09-20 NOTE — Telephone Encounter (Signed)
I encourage him to call 1-800-QUIT-NOW to help with counseling on quitting tobacco  This is a free service.

## 2017-09-20 NOTE — Telephone Encounter (Signed)
[facility].  He advised that [facility].  We started him on Chantix in November 2018 and he said nothing has changed.  He will be continuing here.

## 2017-12-16 ENCOUNTER — Other Ambulatory Visit: Payer: Self-pay | Admitting: Medical

## 2017-12-16 DIAGNOSIS — M545 Low back pain: Secondary | ICD-10-CM

## 2017-12-29 ENCOUNTER — Ambulatory Visit: Payer: BC Managed Care – PPO | Admitting: Medical

## 2017-12-29 ENCOUNTER — Encounter: Payer: Self-pay | Admitting: Medical

## 2017-12-29 VITALS — BP 126/80 | HR 89 | Temp 98.3°F | Ht 71.5 in | Wt 228.8 lb

## 2017-12-29 DIAGNOSIS — Z87891 Personal history of nicotine dependence: Secondary | ICD-10-CM

## 2017-12-29 DIAGNOSIS — L729 Follicular cyst of the skin and subcutaneous tissue, unspecified: Secondary | ICD-10-CM

## 2017-12-29 DIAGNOSIS — I10 Essential (primary) hypertension: Secondary | ICD-10-CM

## 2017-12-29 NOTE — Progress Notes (Signed)
Subjective: Chief Complaint  Patient presents with  . Mass    x2 weeks    Here today for lump in the left scrotum for the last 2 weeks.  No pain no tenderness no urinary changes no swelling of the testicles no lymphadenopathy.  No concern for STD.  He has hx/o herpes, but no recent blisters or lesions.    Quit smoking11/2018!!!   He has been exercising more and eating healthy since last fall.  Has lost some weight, happy with his progress.     Past Medical History:  Diagnosis Date  . Allergy    RHINITS  . Chronic shoulder pain    left  . Diverticulosis   . ED (erectile dysfunction)   . H/O echocardiogram 03/20/08   mild - moderate LVH, EF 50-55%, mild RV enlargement, mild mitral regurgitation, Dr. Verlon Setting  . 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 History of cardiovascular stress test 03/20/2008   dobutamine stress test, normal study, Dr. Verlon Setting  . History of neck surgery   . Hyperlipidemia   . Hypertension   . Impaired fasting glucose   . Smoker    Current Outpatient Medications on File Prior to Visit  Medication Sig Dispense Refill  . atorvastatin (LIPITOR) 20 MG tablet TAKE 1 TABLET(20 MG) BY MOUTH DAILY AT 6 PM 90 tablet 3  . lisinopril-hydrochlorothiazide (PRINZIDE,ZESTORETIC) 10-12.5 MG tablet Take 1 tablet by mouth daily. 90 tablet 3  . meloxicam (MOBIC) 15 MG tablet TAKE 1 TABLET(15 MG) BY MOUTH DAILY 30 tablet 2   No current facility-administered medications on file prior to visit.    ROS as in subjective    Objective: BP 126/80   Pulse 89   Temp 98.3 F (36.8 C) (Oral)   Ht 5' 11.5" (1.816 m)   Wt 228 lb 12.8 oz (103.8 kg)   SpO2 96%   BMI 31.47 kg/m   Gen: wd, wn, nad Skin: left superior lateral scrotum with 5-6 mm diameter soft tissue cyst.   No other worrisome findings, no testicular mass, no lymphadenopathy, no rash    Assessment: Encounter Diagnoses  Name Primary?  . Scrotal cyst Yes  . Former smoker   . Essential hypertension, benign      Plan: Scrotal cyst - discussed finding and Dr. Redmond School supervising physician also examined him   Reassured.     Former smoker - congratulated him on his efforts  HTN - improved, glad to see he is exercising and made lifestyle changes  James Bird was seen today for mass.  Diagnoses and all orders for this visit:  Scrotal cyst  Former smoker  Essential hypertension, benign

## 2018-01-16 ENCOUNTER — Encounter: Payer: Self-pay | Admitting: Family Medicine

## 2018-01-16 ENCOUNTER — Ambulatory Visit: Payer: BC Managed Care – PPO | Admitting: Family Medicine

## 2018-01-16 VITALS — BP 120/78 | HR 91 | Temp 98.5°F | Ht 72.0 in | Wt 230.2 lb

## 2018-01-16 DIAGNOSIS — L237 Allergic contact dermatitis due to plants, except food: Secondary | ICD-10-CM | POA: Diagnosis not present

## 2018-01-16 DIAGNOSIS — L03116 Cellulitis of left lower limb: Secondary | ICD-10-CM | POA: Diagnosis not present

## 2018-01-16 MED ORDER — METHYLPREDNISOLONE SODIUM SUCC 125 MG IJ SOLR
125.0000 mg | Freq: Once | INTRAMUSCULAR | Status: AC
  Administered 2018-01-16: 125 mg via INTRAMUSCULAR

## 2018-01-16 MED ORDER — PREDNISONE 10 MG (48) PO TBPK: ORAL_TABLET | ORAL | 0 refills | Status: DC

## 2018-01-16 MED ORDER — DOXYCYCLINE HYCLATE 100 MG PO TABS: 100.0000 mg | ORAL_TABLET | Freq: Two times a day (BID) | ORAL | 0 refills | Status: DC

## 2018-01-16 NOTE — Addendum Note (Signed)
Addended by: Gwinda Maine on: 01/16/2018 03:49 PM   Modules accepted: Orders

## 2018-01-16 NOTE — Progress Notes (Signed)
   Subjective:    Patient ID: James Bird, male    DOB: 26-Jan-1961, 57 y.o.   MRN: [id number]  HPI Last Saturday while working in the yard he used to mediators shortly after that noted difficulty with a rash with itching.  The left calf got much worse and became hot red and tender within the last day.   Review of Systems     Objective:   Physical Exam Alert and in no distress.  Exam of both arms does show multiple erythematous vesicles.  He also has the same lesions present on the outer aspect of both calves however the left calf is also hot red and tender.       Assessment & Plan:  Cellulitis of left lower extremity - Plan: doxycycline (VIBRA-TABS) 100 MG tablet  Contact dermatitis due to poison ivy - Plan: predniSONE (STERAPRED UNI-PAK 48 TAB) 10 MG (48) TBPK tablet  Benadryl at night and Claritin during the day.  Cool compresses.  Return here Friday.  He was also given steroid injection

## 2018-01-16 NOTE — Patient Instructions (Signed)
Poison Ivy Dermatitis Poison ivy dermatitis is redness and soreness (inflammation) of the skin. It is caused by a chemical that is found on the leaves of the poison ivy plant. You may also have itching, a rash, and blisters. Symptoms often clear up in 1-2 weeks. You may get this condition by touching a poison ivy plant. You can also get it by touching something that has the chemical on it. This may include animals or objects that have come in contact with the plant. Follow these instructions at home: General instructions  Take or apply over-the-counter and prescription medicines only as told by your doctor.  If you touch poison ivy, wash your skin with soap and cold water right away.  Use hydrocortisone creams or calamine lotion as needed to help with itching.  Take oatmeal baths as needed. Use colloidal oatmeal. You can get this at a pharmacy or grocery store. Follow the instructions on the package.  Do not scratch or rub your skin.  While you have the rash, wash your clothes right after you wear them. Prevention  Know what poison ivy looks like so you can avoid it. This plant has three leaves with flowering branches on a single stem. The leaves are glossy. They have uneven edges that come to a point at the front.  If you have touched poison ivy, wash with soap and water right away. Be sure to wash under your fingernails.  When hiking or camping, wear long pants, a long-sleeved shirt, tall socks, and hiking boots. You can also use a lotion on your skin that helps to prevent contact with the chemical on the plant.  If you think that your clothes or outdoor gear came in contact with poison ivy, rinse them off with a garden hose before you bring them inside your house. Contact a doctor if:  You have open sores in the rash area.  You have more redness, swelling, or pain in the affected area.  You have redness that spreads beyond the rash area.  You have fluid, blood, or pus coming from  the affected area.  You have a fever.  You have a rash over a large area of your body.  You have a rash on your eyes, mouth, or genitals.  Your rash does not get better after a few days. Get help right away if:  Your face swells or your eyes swell shut.  You have trouble breathing.  You have trouble swallowing. This information is not intended to replace advice given to you by your health care provider. Make sure you discuss any questions you have with your health care provider. Document Released: 09/03/2010 Document Revised: 01/07/2016 Document Reviewed: 01/07/2015 Elsevier Interactive Patient Education  2018 Elsevier Inc.  

## 2018-01-19 ENCOUNTER — Encounter: Payer: Self-pay | Admitting: Family Medicine

## 2018-01-19 ENCOUNTER — Ambulatory Visit: Payer: BC Managed Care – PPO | Admitting: Family Medicine

## 2018-01-19 VITALS — BP 138/84 | HR 83 | Temp 98.2°F | Ht 72.0 in | Wt 233.0 lb

## 2018-01-19 DIAGNOSIS — L03116 Cellulitis of left lower limb: Secondary | ICD-10-CM | POA: Diagnosis not present

## 2018-01-19 DIAGNOSIS — L237 Allergic contact dermatitis due to plants, except food: Secondary | ICD-10-CM

## 2018-01-19 NOTE — Progress Notes (Signed)
   Subjective:    Patient ID: James Bird, male    DOB: 1960/10/17, 57 y.o.   MRN: [id number]  HPI He is here for recheck.  He is having less itching as well as less pain and swelling in the left calf area.   Review of Systems     Objective:   Physical Exam Alert and in no distress.  Exam of the lower extremities does show less swelling, no erythema or tenderness.       Assessment & Plan:  Cellulitis of left lower extremity  Contact dermatitis due to poison ivy The cellulitis is definitely clearing up.  He is to use the entire antibiotic.  I did say that he can stop taking the steroids.  Return here as needed.

## 2018-01-29 ENCOUNTER — Encounter: Payer: Self-pay | Admitting: Medical

## 2018-03-30 ENCOUNTER — Encounter: Payer: Self-pay | Admitting: Medical

## 2018-04-02 ENCOUNTER — Encounter: Payer: Self-pay | Admitting: Medical

## 2018-04-24 ENCOUNTER — Encounter: Payer: Self-pay | Admitting: Medical

## 2018-04-24 ENCOUNTER — Ambulatory Visit (INDEPENDENT_AMBULATORY_CARE_PROVIDER_SITE_OTHER): Payer: BC Managed Care – PPO | Admitting: Medical

## 2018-04-24 VITALS — BP 130/78 | HR 78 | Resp 16 | Ht 71.0 in | Wt 237.4 lb

## 2018-04-24 DIAGNOSIS — K5900 Constipation, unspecified: Secondary | ICD-10-CM | POA: Insufficient documentation

## 2018-04-24 DIAGNOSIS — R198 Other specified symptoms and signs involving the digestive system and abdomen: Secondary | ICD-10-CM | POA: Insufficient documentation

## 2018-04-24 DIAGNOSIS — M5416 Radiculopathy, lumbar region: Secondary | ICD-10-CM

## 2018-04-24 DIAGNOSIS — Z23 Encounter for immunization: Secondary | ICD-10-CM | POA: Diagnosis not present

## 2018-04-24 DIAGNOSIS — Z0000 Encounter for general adult medical examination without abnormal findings: Secondary | ICD-10-CM | POA: Diagnosis not present

## 2018-04-24 DIAGNOSIS — R7301 Impaired fasting glucose: Secondary | ICD-10-CM

## 2018-04-24 DIAGNOSIS — N528 Other male erectile dysfunction: Secondary | ICD-10-CM

## 2018-04-24 DIAGNOSIS — I10 Essential (primary) hypertension: Secondary | ICD-10-CM | POA: Diagnosis not present

## 2018-04-24 DIAGNOSIS — M21371 Foot drop, right foot: Secondary | ICD-10-CM

## 2018-04-24 DIAGNOSIS — Z7185 Encounter for immunization safety counseling: Secondary | ICD-10-CM

## 2018-04-24 DIAGNOSIS — Z125 Encounter for screening for malignant neoplasm of prostate: Secondary | ICD-10-CM

## 2018-04-24 DIAGNOSIS — Z7189 Other specified counseling: Secondary | ICD-10-CM

## 2018-04-24 LAB — POCT URINALYSIS DIP (PROADVANTAGE DEVICE)
BILIRUBIN UA: NEGATIVE
Blood, UA: NEGATIVE
Glucose, UA: NEGATIVE mg/dL
Ketones, POC UA: NEGATIVE mg/dL
NITRITE UA: NEGATIVE
PH UA: 6 (ref 5.0–8.0)
Protein Ur, POC: NEGATIVE mg/dL
Specific Gravity, Urine: 1.025
Urobilinogen, Ur: NEGATIVE

## 2018-04-24 NOTE — Progress Notes (Signed)
Subjective:   HPI  James Bird is a 57 y.o. male who presents for Chief Complaint  Patient presents with  . Annual Exam    Medical care team includes: Denita Lung, MD here for primary care Dentist Eye doctor  Concerns: Former smoker, quit last fall.  Lately having some issues with BMs.   Takes longer to get BM, seems somewhat constipated.  sometimes has urge but when he tries, nothing comes out.   Been having issues for months.   Has BM maybe 2 times per week.   No blood in stool  Exercise - [employer], treadmill, weight lifting.  Low back pain, right leg dragging at times.  saw neurosurgery for this in past year, had steroid injections a few times, but still having the same issues.     Reviewed their medical, surgical, family, social, medication, and allergy history and updated chart as appropriate.  Past Medical History:  Diagnosis Date  . Allergy    RHINITS  . Chronic shoulder pain    left  . Constipation 2019  . Diverticulosis   . ED (erectile dysfunction)   . Former smoker    quit late 2018  . H/O echocardiogram 03/20/08   mild - moderate LVH, EF 50-55%, mild RV enlargement, mild mitral regurgitation, Dr. Verlon Setting  . 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 History of cardiovascular stress test 03/20/2008   dobutamine stress test, normal study, Dr. Verlon Setting  . History of neck surgery   . Hyperlipidemia   . Hypertension   . Impaired fasting glucose     Past Surgical History:  Procedure Laterality Date  . cdisc & fusion  2011   Dr. Arnoldo Morale  . COLONOSCOPY  10/2010   diverticulosis, othewrise normal, repeat 10 years, Dr. Henrene Pastor  . SPINE SURGERY  2011(JENKINS,MD)   CERVICAL DISC AND FUSION    Social History   Socioeconomic History  . Marital status: Single    Spouse name: Not on file  . Number of children: Not on file  . Years of education: Not on file  . Highest education level: Not on file  Occupational History  . Occupation: Geologist, engineering: [employer]  . Financial resource strain: Not on file  . Food insecurity:    Worry: Not on file    Inability: Not on file  . Transportation needs:    Medical: Not on file    Non-medical: Not on file  Tobacco Use  . Smoking status: Former Smoker    Packs/day: 0.50    Years: 16.00    Pack years: 8.00    Types: Cigarettes    Last attempt to quit: 05/15/2017    Years since quitting: 0.9  . Smokeless tobacco: Never Used  Substance and Sexual Activity  . Alcohol use: Yes    Alcohol/week: 10.0 standard drinks    Types: 10 Cans of beer per week    Comment: 10 drinks per week, sometimes more  . 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 Sexual activity: Not on file  Lifestyle  . Physical activity:    Days per week: Not on file    Minutes per session: Not on file  . Stress: Not on file  Relationships  . Social connections:    Talks on phone: Not on file    Gets together: Not on file    Attends  religious service: Not on file    Active member of club or organization: Not on file    Attends meetings of clubs or organizations: Not on file    Relationship status: Not on file  . Intimate partner violence:    Fear of current or ex partner: Not on file    Emotionally abused: Not on file    Physically abused: Not on file    Forced sexual activity: Not on file  Other Topics Concern  . Not on file  Social History Narrative   Single, 2 daughters, exercise - sometimes racquetball and walking, works in IT at [employer].  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   03/2017    Family History  Problem Relation Age of Onset  . Cancer Mother        LUNG  . Heart disease Father        MI  . Cancer Paternal Aunt   . Cancer Paternal Uncle   . Hypertension Brother   . Diabetes Neg Hx   . Stroke Neg Hx      Current Outpatient Medications:  .  atorvastatin (LIPITOR) 20 MG tablet, TAKE 1 TABLET(20 MG) BY MOUTH DAILY  AT 6 PM, Disp: 90 tablet, Rfl: 3 .  doxycycline (VIBRA-TABS) 100 MG tablet, Take 1 tablet (100 mg total) by mouth 2 (two) times daily., Disp: 28 tablet, Rfl: 0 .  lisinopril-hydrochlorothiazide (PRINZIDE,ZESTORETIC) 10-12.5 MG tablet, Take 1 tablet by mouth daily., Disp: 90 tablet, Rfl: 3 .  meloxicam (MOBIC) 15 MG tablet, TAKE 1 TABLET(15 MG) BY MOUTH DAILY, Disp: 30 tablet, Rfl: 2  No Known Allergies    Review of Systems Constitutional: -fever, -chills, -sweats, -unexpected weight change, -decreased appetite, -fatigue Allergy: -sneezing, -itching, -congestion Dermatology: -changing moles, --rash, -lumps ENT: -runny nose, -ear pain, -sore throat, -hoarseness, -sinus pain, -teeth pain, - ringing in ears, -hearing loss, -nosebleeds Cardiology: -chest pain, -palpitations, -swelling, -difficulty breathing when lying flat, -waking up short of breath Respiratory: -cough, -shortness of breath, -difficulty breathing with exercise or exertion, -wheezing, -coughing up blood Gastroenterology: -abdominal pain, -nausea, -vomiting, -diarrhea, +constipation, -blood in stool, -changes in bowel movement, -difficulty swallowing or eating Hematology: -bleeding, -bruising  Musculoskeletal: -joint aches, -muscle aches, -joint swelling, -back pain, -neck pain, -cramping, -changes in gait Ophthalmology: denies vision changes, eye redness, itching, discharge Urology: -burning with urination, -difficulty urinating, -blood in urine, -urinary frequency, -urgency, -incontinence Neurology: -headache, -weakness, -tingling, -numbness, +right foot drop, -memory loss, -falls, -dizziness Psychology: -depressed mood, -agitation, -sleep problems Male GU: no testicular mass, pain, no lymph nodes swollen, no swelling, no rash.     Objective:  BP 130/78   Pulse 78   Resp 16   Ht '5\' 11"'  (1.803 m)   Wt 237 lb 6.4 oz (107.7 kg)   SpO2 97%   BMI 33.11 kg/m    General appearance: alert, no distress, WD/WN, overweight  black male  Skin: several benign appearing macules of face, and erythema and raised papular lesions of scalp suggestive of seborrheic dermatitis and similar inflamed lesion of neck suggestive of folliculitis barbae  HEENT: normocephalic, conjunctiva/corneas normal, sclerae anicteric, PERRLA, EOMi, nares patent, no discharge or erythema, pharynx normal  Oral cavity: MMM, tongue normal, teeth in good repair  Neck: supple, no lymphadenopathy, no thyromegaly, no masses, normal ROM, no bruits  Chest: non tender, normal shape and expansion  Heart: RRR, normal S1, S2, no murmurs  Lungs: CTA bilaterally, no wheezes, rhonchi, or rales  Abdomen: +bs,  soft, +9.3AT umbilical hernia, reducible, otherwise non tender, non distended, no masses, no hepatomegaly, no splenomegaly, no bruits  Back: non tender, mild pain with back flexion and extension, normal ROM, no scoliosis  Musculoskeletal: left knee with mild pain with Mcmurray,no laxity, no swelling, otherwise MSK unremarkable extremities non tender, no obvious deformity, normal ROM throughout Extremities: no edema, no cyanosis, no clubbing  Pulses: 2+ symmetric, upper and lower extremities, normal cap refill  Neurological: right lower leg with 3 beats of clonus, decreased DTRs, and slight foot drop noted with ambulation, alert, oriented x 3, CN2-12 intact, strength normal upper extremities and lower extremities, sensation normal throughout, DTRs 2+ throughout, no cerebellar signs, gait normal  Psychiatric: normal affect, behavior normal, pleasant  GU: normal male external genitalia, nontender, no masses, no hernia, no lymphadenopathy  Rectal: anus normal tone, prostate mildly enlarged, no nodules, occult negative stool   Adult ECG Report  Indication: HTN  Rate: 78 bpm  Rhythm: normal sinus rhythm  QRS Axis: 52 degrees  PR Interval: 164 ms  QRS Duration: 84 ms  QTc: 452m  Conduction Disturbances: P wave enlargement  Other Abnormalities:  none  Patient's cardiac risk factors are: dyslipidemia, hypertension, male gender and obesity (BMI >= 30 kg/m2).  EKG comparison: 2016   Narrative Interpretation: no acute changes    Assessment and Plan :   Encounter Diagnoses  Name Primary?  . Routine general medical examination at a health care facility Yes  . Need for influenza vaccination   . Essential hypertension, benign   . Impaired fasting blood sugar   . Other male erectile dysfunction   . Right foot drop   . Screening for prostate cancer   . Vaccine counseling   . Constipation, unspecified constipation type     Physical exam - discussed and counseled on healthy lifestyle, diet, exercise, preventative care, vaccinations, sick and well care, proper use of emergency dept and after hours care, and addressed their concerns.    Health screening: See your eye doctor yearly for routine vision care. See your dentist yearly for routine dental care including hygiene visits twice yearly.  Cancer screening Colonoscopy:  Reviewed colonoscopy on file that is up to date Given stool cards kit to return for hemoccult screening  Discussed PSA, prostate exam, and prostate cancer screening risks/benefits.     Vaccinations: Advised yearly influenza vaccine Counseled on the influenza virus vaccine.  Vaccine information sheet given.  Influenza vaccine given after consent obtained.  Counseled on Shingles vaccine at age 6111years and older Patient will check insurance coverage for this and consider vaccination  Separate significant  issues discussed: constipation - begin trial of Linzess, discussed water and fiber intake  Referral to cardiology for updated cardiac eval, last eval 10 years ago  Foot drop/leg weakness - reviewed neurosurgery notes from 08/22/17.  Pending labs, will likely recommend PT or neurology consult.  The neurosurgery notes focused more on back pain and not so much on foot drop.    Chett was seen today for annual  exam.  Diagnoses and all orders for this visit:  Routine general medical examination at a health care facility -     POCT Urinalysis DIP (Proadvantage Device) -     EKG 12-Lead -     Comprehensive metabolic panel -     CBC -     Lipid panel -     PSA -     Hemoglobin A1c -     Microalbumin / creatinine urine ratio  Need for influenza vaccination -     Flu Vaccine QUAD 6+ mos PF IM (Fluarix Quad PF)  Essential hypertension, benign -     EKG 12-Lead -     Comprehensive metabolic panel -     Lipid panel -     Microalbumin / creatinine urine ratio -     Ambulatory referral to Cardiology  Impaired fasting blood sugar -     Hemoglobin A1c  Other male erectile dysfunction  Right foot drop  Screening for prostate cancer -     PSA  Vaccine counseling  Constipation, unspecified constipation type    Follow-up pending labs, yearly for physical

## 2018-04-24 NOTE — Patient Instructions (Signed)
Thanks for trusting [place] with your health care and for coming in for a physical today.  Below are some general recommendations I have for you:  Yearly screenings See your eye doctor yearly for routine vision care. See your dentist yearly for routine dental care including hygiene visits twice yearly. See me here yearly for a routine physical and preventative care visit   Specific Concerns today:  Shingles vaccine:  I recommend you have a shingles vaccine to help prevent shingles or herpes zoster outbreak.   Please call your insurer to inquire about coverage for the Shingrix vaccine given in 2 doses.   Some insurers cover this vaccine after age 63, some cover this after age 66.  If your insurer covers this, then call to schedule appointment to have this vaccine here.  Return the stool cards for blood screening  Begin trial of Linzess 60mcg once daily to help with constipation.   Drink 64 or more ounces of water daily, eat plenty of fiber in the diet  I think it would be a good idea to see cardiology for updated heart screening.  Your EKG was a little different than prior today  I will review prior neurosurgery records and give some recommendations regarding foot drop/clonus.    Please follow up yearly for a physical.   Preventative Care for Adults - Male      [place]: A routine yearly physical is a good way to check in with your primary care provider about your health and preventive screening. It is also an opportunity to share updates about your health and any concerns you have, and receive a thorough all-over exam.  Most health insurance companies pay for at least some preventative services.  Check with your health plan for specific coverages.  WHAT PREVENTATIVE SERVICES DO WOMEN NEED? Adult men should have their weight and blood pressure checked regularly.  Men age 41 and older should have their cholesterol levels checked regularly. Beginning at age 36 and  continuing to age 54, men should be screened for colorectal cancer.  Certain people may need continued testing until age 39. Updating vaccinations is part of preventative care.  Vaccinations help protect against diseases such as the flu. Osteoporosis is a disease in which the bones lose minerals and strength as we age. Men ages 64 and over should discuss this with their caregivers Lab tests are generally done as part of preventative care to screen for anemia and blood disorders, to screen for problems with the kidneys and liver, to screen for bladder problems, to check blood sugar, and to check your cholesterol level. Preventative services generally include counseling about diet, exercise, avoiding tobacco, drugs, excessive alcohol consumption, and sexually transmitted infections.    GENERAL RECOMMENDATIONS FOR GOOD HEALTH:  Healthy diet: Eat a variety of foods, including fruit, vegetables, animal or vegetable protein, such as meat, fish, chicken, and eggs, or beans, lentils, tofu, and grains, such as rice. Drink plenty of water daily. Decrease saturated fat in the diet, avoid lots of red meat, processed foods, sweets, fast foods, and fried foods.  Exercise: Aerobic exercise helps maintain good heart health. At least 30-40 minutes of moderate-intensity exercise is recommended. For example, a brisk walk that increases your heart rate and breathing. This should be done on most days of the week.  Find a type of exercise or a variety of exercises that you enjoy so that it becomes a part of your daily life.  Examples are running, walking, swimming, water  aerobics, and biking.  For motivation and support, explore group exercise such as aerobic class, spin class, Zumba, Yoga,or  martial arts, etc.   Set exercise goals for yourself, such as a certain weight goal, walk or run in a race such as a 5k walk/run.  Speak to your primary care provider about exercise goals.  Disease prevention: If you smoke or  chew tobacco, find out from your caregiver how to quit. It can literally save your life, no matter how long you have been a tobacco user. If you do not use tobacco, never begin.  Maintain a healthy diet and normal weight. Increased weight leads to problems with blood pressure and diabetes.  The Body Mass Index or BMI is a way of measuring how much of your body is fat. Having a BMI above 27 increases the risk of heart disease, diabetes, hypertension, stroke and other problems related to obesity. Your caregiver can help determine your BMI and based on it develop an exercise and dietary program to help you achieve or maintain this important measurement at a healthful level. High blood pressure causes heart and blood vessel problems.  Persistent high blood pressure should be treated with medicine if weight loss and exercise do not work.  Fat and cholesterol leaves deposits in your arteries that can block them. This causes heart disease and vessel disease elsewhere in your body.  If your cholesterol is found to be high, or if you have heart disease or certain other medical conditions, then you may need to have your cholesterol monitored frequently and be treated with medication.  Ask if you should have a cardiac stress test if your history suggests this. A stress test is a test done on a treadmill that looks for heart disease. This test can find disease prior to there being a problem. Osteoporosis is a disease in which the bones lose minerals and strength as we age. This can result in serious bone fractures. Risk of osteoporosis can be identified using a bone density scan. Men ages 46 and over should discuss this with their caregivers. Ask your caregiver whether you should be taking a calcium supplement and Vitamin D, to reduce the rate of osteoporosis.  Avoid drinking alcohol in excess (more than two drinks per day).  Avoid use of street drugs. Do not share needles with anyone. Ask for professional help if you  need assistance or instructions on stopping the use of alcohol, cigarettes, and/or drugs. Brush your teeth twice a day with fluoride toothpaste, and floss once a day. Good oral hygiene prevents tooth decay and gum disease. The problems can be painful, unattractive, and can cause other health problems. Visit your dentist for a routine oral and dental check up and preventive care every 6-12 months.  Look at your skin regularly.  Use a mirror to look at your back. Notify your caregivers of changes in moles, especially if there are changes in shapes, colors, a size larger than a pencil eraser, an irregular border, or development of new moles.  Safety: Use seatbelts 100% of the time, whether driving or as a passenger.  Use safety devices such as hearing protection if you work in environments with loud noise or significant background noise.  Use safety glasses when doing any work that could send debris in to the eyes.  Use a helmet if you ride a bike or motorcycle.  Use appropriate safety gear for contact sports.  Talk to your caregiver about gun safety. Use sunscreen with a SPF (  or skin protection factor) of 15 or greater.  Lighter skinned people are at a greater risk of skin cancer. Don't forget to also wear sunglasses in order to protect your eyes from too much damaging sunlight. Damaging sunlight can accelerate cataract formation.  Practice safe sex. Use condoms. Condoms are used for birth control and to help reduce the spread of sexually transmitted infections (or STIs).  Some of the STIs are gonorrhea (the clap), chlamydia, syphilis, trichomonas, herpes, HPV (human papilloma virus) and HIV (human immunodeficiency virus) which causes AIDS. The herpes, HIV and HPV are viral illnesses that have no cure. These can result in disability, cancer and death.  Keep carbon monoxide and smoke detectors in your home functioning at all times. Change the batteries every 6 months or use a model that plugs into the wall.    Vaccinations: Stay up to date with your tetanus shots and other required immunizations. You should have a booster for tetanus every 10 years. Be sure to get your flu shot every year, since 5%-20% of the [place] population comes down with the flu. The flu vaccine changes each year, so being vaccinated once is not enough. Get your shot in the fall, before the flu season peaks.   Other vaccines to consider: Human Papilloma Virus or HPV causes cancer of the cervix, and other infections that can be transmitted from person to person. There is a vaccine for HPV, and males should get immunized between the ages of 58 and 55. It requires a series of 3 shots.  Pneumococcal vaccine to protect against certain types of pneumonia.  This is normally recommended for adults age 42 or older.  However, adults younger than 58 years old with certain underlying conditions such as diabetes, heart or lung disease should also receive the vaccine. Shingles vaccine to protect against Varicella Zoster if you are older than age 40, or younger than 58 years old with certain underlying illness.  If you have not had the Shingrix vaccine, please call your insurer to inquire about coverage for the Shingrix vaccine given in 2 doses.   Some insurers cover this vaccine after age 6, some cover this after age 60.  If your insurer covers this, then call to schedule appointment to have this vaccine here Hepatitis A vaccine to protect against a form of infection of the liver by a virus acquired from food. Hepatitis B vaccine to protect against a form of infection of the liver by a virus acquired from blood or body fluids, particularly if you work in health care. If you plan to travel internationally, check with your local health department for specific vaccination recommendations.   What should I know about cancer screening? Many types of cancers can be detected early and may often be prevented. Lung Cancer You should be screened every year  for lung cancer if: You are a current smoker who has smoked for at least 30 years. You are a former smoker who has quit within the past 15 years. Talk to your health care provider about your screening options, when you should start screening, and how often you should be screened.  Colorectal Cancer Routine colorectal cancer screening usually begins at 58 years of age and should be repeated every 5-10 years until you are 58 years old. You may need to be screened more often if early forms of precancerous polyps or small growths are found. Your health care provider may recommend screening at an earlier age if you have risk factors for colon  cancer. Your health care provider may recommend using home test kits to check for hidden blood in the stool. A small camera at the end of a tube can be used to examine your colon (sigmoidoscopy or colonoscopy). This checks for the earliest forms of colorectal cancer.  Prostate and Testicular Cancer Depending on your age and overall health, your health care provider may do certain tests to screen for prostate and testicular cancer. Talk to your health care provider about any symptoms or concerns you have about testicular or prostate cancer.  Skin Cancer Check your skin from head to toe regularly. Tell your health care provider about any new moles or changes in moles, especially if: There is a change in a mole's size, shape, or color. You have a mole that is larger than a pencil eraser. Always use sunscreen. Apply sunscreen liberally and repeat throughout the day. Protect yourself by wearing long sleeves, pants, a wide-brimmed hat, and sunglasses when outside.

## 2018-04-25 ENCOUNTER — Other Ambulatory Visit: Payer: Self-pay | Admitting: Medical

## 2018-04-25 DIAGNOSIS — L03116 Cellulitis of left lower limb: Secondary | ICD-10-CM

## 2018-04-25 DIAGNOSIS — M5416 Radiculopathy, lumbar region: Secondary | ICD-10-CM | POA: Insufficient documentation

## 2018-04-25 LAB — LIPID PANEL
CHOLESTEROL TOTAL: 184 mg/dL (ref 100–199)
Chol/HDL Ratio: 3.1 ratio (ref 0.0–5.0)
HDL: 59 mg/dL (ref >39)
LDL CALC: 99 mg/dL (ref 0–99)
TRIGLYCERIDES: 130 mg/dL (ref 0–149)
VLDL Cholesterol Cal: 26 mg/dL (ref 5–40)

## 2018-04-25 LAB — CBC
HEMOGLOBIN: 15.6 g/dL (ref 13.0–17.7)
Hematocrit: 46.7 % (ref 37.5–51.0)
MCH: 29.5 pg (ref 26.6–33.0)
MCHC: 33.4 g/dL (ref 31.5–35.7)
MCV: 88 fL (ref 79–97)
Platelets: 268 10*3/uL (ref 150–450)
RBC: 5.29 x10E6/uL (ref 4.14–5.80)
RDW: 13.2 % (ref 12.3–15.4)
WBC: 7 10*3/uL (ref 3.4–10.8)

## 2018-04-25 LAB — HEMOGLOBIN A1C
ESTIMATED AVERAGE GLUCOSE: 117 mg/dL
Hgb A1c MFr Bld: 5.7 % — ABNORMAL HIGH (ref 4.8–5.6)

## 2018-04-25 LAB — PSA: Prostate Specific Ag, Serum: 7 ng/mL — ABNORMAL HIGH (ref 0.0–4.0)

## 2018-04-25 LAB — COMPREHENSIVE METABOLIC PANEL
ALT: 27 IU/L (ref 0–44)
AST: 25 IU/L (ref 0–40)
Albumin/Globulin Ratio: 1.8 (ref 1.2–2.2)
Albumin: 4.6 g/dL (ref 3.5–5.5)
Alkaline Phosphatase: 63 IU/L (ref 39–117)
BILIRUBIN TOTAL: 0.6 mg/dL (ref 0.0–1.2)
BUN/Creatinine Ratio: 18 (ref 9–20)
BUN: 15 mg/dL (ref 6–24)
CHLORIDE: 98 mmol/L (ref 96–106)
CO2: 25 mmol/L (ref 20–29)
Calcium: 10 mg/dL (ref 8.7–10.2)
Creatinine, Ser: 0.84 mg/dL (ref 0.76–1.27)
GFR calc Af Amer: 112 mL/min/{1.73_m2} (ref >59)
GFR calc non Af Amer: 97 mL/min/{1.73_m2} (ref >59)
Globulin, Total: 2.5 g/dL (ref 1.5–4.5)
Glucose: 90 mg/dL (ref 65–99)
POTASSIUM: 4.3 mmol/L (ref 3.5–5.2)
Sodium: 142 mmol/L (ref 134–144)
Total Protein: 7.1 g/dL (ref 6.0–8.5)

## 2018-04-25 LAB — MICROALBUMIN / CREATININE URINE RATIO
Creatinine, Urine: 117.8 mg/dL
MICROALBUM., U, RANDOM: 11.8 ug/mL
Microalb/Creat Ratio: 10 mg/g creat (ref 0.0–30.0)

## 2018-04-25 MED ORDER — DOXYCYCLINE HYCLATE 100 MG PO TABS: 100.0000 mg | ORAL_TABLET | Freq: Two times a day (BID) | ORAL | 0 refills | Status: DC

## 2018-05-07 ENCOUNTER — Other Ambulatory Visit (INDEPENDENT_AMBULATORY_CARE_PROVIDER_SITE_OTHER): Payer: BC Managed Care – PPO

## 2018-05-07 ENCOUNTER — Other Ambulatory Visit: Payer: Self-pay | Admitting: *Deleted

## 2018-05-07 DIAGNOSIS — Z0000 Encounter for general adult medical examination without abnormal findings: Secondary | ICD-10-CM

## 2018-05-07 LAB — POC HEMOCCULT BLD/STL (HOME/3-CARD/SCREEN)
Card #3 Fecal Occult Blood, POC: NEGATIVE
FECAL OCCULT BLD: NEGATIVE
FECAL OCCULT BLD: NEGATIVE

## 2018-05-09 ENCOUNTER — Ambulatory Visit: Payer: BC Managed Care – PPO | Admitting: Family Medicine

## 2018-05-09 ENCOUNTER — Other Ambulatory Visit: Payer: BC Managed Care – PPO

## 2018-05-09 ENCOUNTER — Encounter: Payer: Self-pay | Admitting: Family Medicine

## 2018-05-09 VITALS — BP 146/86 | HR 72 | Temp 98.4°F | Ht 71.0 in | Wt 240.8 lb

## 2018-05-09 DIAGNOSIS — B351 Tinea unguium: Secondary | ICD-10-CM

## 2018-05-09 DIAGNOSIS — B353 Tinea pedis: Secondary | ICD-10-CM

## 2018-05-09 DIAGNOSIS — M25512 Pain in left shoulder: Secondary | ICD-10-CM | POA: Diagnosis not present

## 2018-05-09 NOTE — Patient Instructions (Signed)
2 Aleve twice per day for the next 7 to 10 days and also work on range of motion

## 2018-05-09 NOTE — Progress Notes (Signed)
   Subjective:    Patient ID: James Bird, male    DOB: 1961-02-11, 57 y.o.   MRN: [id number]  HPI He complains of a 3-day history of left arm pain and slight tingling.  The pain apparently lost all day.  No associated chest pain, shortness of breath, diaphoresis, numbness or tingling.  He also has concerns about his feet having dryness and scaling as well as toenails curling and causing some difficulty. He does have an appointment set up soon for cardiology.   Review of Systems     Objective:   Physical Exam Alert and in no distress.  Full motion of the left shoulder.  Internal and external rotation does cause some difficulty.  Drop arm test was negative.  Negative sulcus sign.  Neer's and Hawkins test was uncomfortable.  Exam of his feet does show dryness and scaling with thickening of the nails.       Assessment & Plan:  Acute pain of left shoulder  Onychomycosis  Tinea pedis of both feet I explained that I did not think the shoulder pain was heart related.  Recommended NSAID of choice regularly for the next 7 to 10 days and if no improvement return here. We will also refer to podiatry.

## 2018-05-10 ENCOUNTER — Ambulatory Visit: Payer: BC Managed Care – PPO | Admitting: Internal Medicine

## 2018-05-10 ENCOUNTER — Encounter: Payer: Self-pay | Admitting: Internal Medicine

## 2018-05-10 VITALS — BP 146/92 | HR 90 | Ht 71.5 in | Wt 239.8 lb

## 2018-05-10 DIAGNOSIS — I10 Essential (primary) hypertension: Secondary | ICD-10-CM

## 2018-05-10 DIAGNOSIS — Z7189 Other specified counseling: Secondary | ICD-10-CM | POA: Diagnosis not present

## 2018-05-10 DIAGNOSIS — E782 Mixed hyperlipidemia: Secondary | ICD-10-CM | POA: Diagnosis not present

## 2018-05-10 MED ORDER — LISINOPRIL-HYDROCHLOROTHIAZIDE 20-12.5 MG PO TABS: 1.0000 | ORAL_TABLET | Freq: Every day | ORAL | 3 refills | Status: DC

## 2018-05-10 NOTE — Progress Notes (Signed)
`   Cardiology Office Note   Date:  05/10/2018   ID:  James, Bird 1960-10-27, MRN [id number]  PCP:  Denita Lung, MD  Cardiologist:   Dorris Carnes, MD   PT referred by Jenna Luo for cardiac evaluation   History of Present Illness: James Bird is a 57 y.o. male with no known cardiac dz    Seen by Jenna Luo inearlier this month.   Had seen cardiology in past      Pt deneis CP    Does have  a history of L shoulder and arm pain  Father had heart dz.     Pt does not exercsie    Has not noticed a change in what he can do       Current Meds  Medication Sig  . atorvastatin (LIPITOR) 20 MG tablet TAKE 1 TABLET(20 MG) BY MOUTH DAILY AT 6 PM  . doxycycline (VIBRA-TABS) 100 MG tablet Take 1 tablet (100 mg total) by mouth 2 (two) times daily.  Marland Kitchen lisinopril-hydrochlorothiazide (PRINZIDE,ZESTORETIC) 10-12.5 MG tablet Take 1 tablet by mouth daily.  . meloxicam (MOBIC) 15 MG tablet TAKE 1 TABLET(15 MG) BY MOUTH DAILY     Allergies:   Patient has no known allergies.   Past Medical History:  Diagnosis Date  . Allergy    RHINITS  . Chronic shoulder pain    left  . Constipation 2019  . Diverticulosis   . ED (erectile dysfunction)   . Former smoker    quit late 2018  . H/O echocardiogram 03/20/08   mild - moderate LVH, EF 50-55%, mild RV enlargement, mild mitral regurgitation, Dr. Verlon Setting  . 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 History of cardiovascular stress test 03/20/2008   dobutamine stress test, normal study, Dr. Verlon Setting  . History of neck surgery   . Hyperlipidemia   . Hypertension   . Impaired fasting glucose     Past Surgical History:  Procedure Laterality Date  . cdisc & fusion  2011   Dr. Arnoldo Morale  . COLONOSCOPY  10/2010   diverticulosis, othewrise normal, repeat 10 years, Dr. Henrene Pastor  . SPINE SURGERY  2011(JENKINS,MD)   CERVICAL DISC AND FUSION     Social History:  The patient  reports that he quit smoking about a year ago. His smoking use  included cigarettes. He has a 8.00 pack-year smoking history. He has never used smokeless tobacco. He reports that he drinks about 10.0 standard drinks of alcohol per week. He reports that he has current or past drug history.   Family History:  The patient's family history includes Cancer in his mother, paternal aunt, and paternal uncle; Heart disease in his father; Hypertension in his brother.    ROS:  Please see the history of present illness. All other systems are reviewed and  Negative to the above problem except as noted.    PHYSICAL EXAM: VS:  BP (!) 146/92   Pulse 90   Ht 5' 11.5" (1.816 m)   Wt 239 lb 12.8 oz (108.8 kg)   BMI 32.98 kg/m   GEN: Well nourished, well developed, in no acute distress  HEENT: normal  Neck: no JVD, carotid bruits, or masses Cardiac: RRR; no murmurs, rubs, or gallops,no edema  Respiratory:  clear to auscultation bilaterally, normal work of breathing GI: soft, nontender, nondistended, + BS  No hepatomegaly  MS: no deformity Moving all extremities   Skin: warm and dry, no  rash Neuro:  Strength and sensation are intact Psych: euthymic mood, full affect   EKG:  EKG is not ordered today.  On 04/26/18   SR 78 bpm   Possible septal MI    Lipid Panel    Component Value Date/Time   CHOL 184 04/24/2018 1312   TRIG 130 04/24/2018 1312   HDL 59 04/24/2018 1312   CHOLHDL 3.1 04/24/2018 1312   CHOLHDL 2.9 03/29/2017 0919   VLDL 20 03/29/2017 0919   LDLCALC 99 04/24/2018 1312      Wt Readings from Last 3 Encounters:  05/10/18 239 lb 12.8 oz (108.8 kg)  05/09/18 240 lb 12.8 oz (109.2 kg)  04/24/18 237 lb 6.4 oz (107.7 kg)      ASSESSMENT AND PLAN:  1  Cardiac risk statication.   I am not convinced that the patient is having anginal symptoms    BP is up some    He is on statin  Last panel LDL was 99   HDL 59     For now I would not recomm further testing     I did recomm he increase his activity and follow  If new symptoms develop would consider  eval  2   HTN   BP is not optimal    I would recomm that he increase lisinopril /HCTZ to 20/12.5 Follow up BP with primary MD  Will be available as needed     Current medicines are reviewed at length with the patient today.  The patient does not have concerns regarding medicines.  Signed, Dorris Carnes, MD  05/10/2018 11:18 AM    [facility], [place],   [address] Phone: [phone]; Fax: [phone]

## 2018-05-10 NOTE — Patient Instructions (Signed)
Your physician has recommended you make the following change in your medication:  1.) lisinopril-hydrochlorothiazide 20/12.5 mg--increase to this dose on next refill--for blood pressure  Your physician recommends that you schedule a follow-up appointment as needed with Dr. Harrington Challenger.

## 2018-05-22 ENCOUNTER — Ambulatory Visit: Payer: BC Managed Care – PPO | Admitting: Medical

## 2018-05-23 ENCOUNTER — Other Ambulatory Visit: Payer: Self-pay

## 2018-05-23 ENCOUNTER — Ambulatory Visit: Payer: BC Managed Care – PPO | Admitting: Medical

## 2018-05-23 ENCOUNTER — Telehealth: Payer: Self-pay

## 2018-05-23 VITALS — BP 120/70 | HR 87 | Temp 98.4°F | Resp 16 | Wt 239.8 lb

## 2018-05-23 DIAGNOSIS — R202 Paresthesia of skin: Secondary | ICD-10-CM | POA: Diagnosis not present

## 2018-05-23 DIAGNOSIS — M79602 Pain in left arm: Secondary | ICD-10-CM

## 2018-05-23 DIAGNOSIS — M21371 Foot drop, right foot: Secondary | ICD-10-CM

## 2018-05-23 DIAGNOSIS — R972 Elevated prostate specific antigen [PSA]: Secondary | ICD-10-CM

## 2018-05-23 DIAGNOSIS — L608 Other nail disorders: Secondary | ICD-10-CM | POA: Diagnosis not present

## 2018-05-23 DIAGNOSIS — B353 Tinea pedis: Secondary | ICD-10-CM

## 2018-05-23 DIAGNOSIS — B351 Tinea unguium: Secondary | ICD-10-CM

## 2018-05-23 NOTE — Progress Notes (Signed)
Subjective Chief Complaint  Patient presents with  . follow up    follow up    Here for f/u.  At his physical visit a few months ago his PSA was elevated.  He was asymptomatic at that time, but we tried a round of doxycycline in the event of possible prostatitis.  He is here to recheck the prostate lab.  No family history of prostate cancer. no recent weight loss no fevers no night sweats.  He never started the foot splint for foot drop that we recommended on call back last visit  He is right-handed.  He reports some ongoing pain and tingling in the left arm.  He has been seen here for this before.  It is intermittent, but recently it has flared up again.  He denies recent injury or trauma.  His main symptom is tingling down the arm but sometimes gets pain.  He thought it was a shoulder issue.  Denies neck pain.  No weakness of the arm  He saw Dr. Redmond School recently here and is still waiting on a referral to podiatry   Past Medical History:  Diagnosis Date  . Allergy    RHINITS  . Chronic shoulder pain    left  . Constipation 2019  . Diverticulosis   . ED (erectile dysfunction)   . Former smoker    quit late 2018  . H/O echocardiogram 03/20/08   mild - moderate LVH, EF 50-55%, mild RV enlargement, mild mitral regurgitation, Dr. Verlon Setting  . 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 History of cardiovascular stress test 03/20/2008   dobutamine stress test, normal study, Dr. Verlon Setting  . History of neck surgery   . Hyperlipidemia   . Hypertension   . Impaired fasting glucose    Current Outpatient Medications on File Prior to Visit  Medication Sig Dispense Refill  . atorvastatin (LIPITOR) 20 MG tablet TAKE 1 TABLET(20 MG) BY MOUTH DAILY AT 6 PM 90 tablet 3  . lisinopril-hydrochlorothiazide (PRINZIDE,ZESTORETIC) 20-12.5 MG tablet Take 1 tablet by mouth daily. 90 tablet 3  . meloxicam (MOBIC) 15 MG tablet TAKE 1 TABLET(15 MG) BY MOUTH DAILY 30 tablet 2  . doxycycline (VIBRA-TABS) 100  MG tablet Take 1 tablet (100 mg total) by mouth 2 (two) times daily. (Patient not taking: Reported on 05/23/2018) 28 tablet 0   No current facility-administered medications on file prior to visit.    ROS as in subjective   Objective: BP 120/70   Pulse 87   Temp 98.4 F (36.9 C) (Oral)   Resp 16   Wt 239 lb 12.8 oz (108.8 kg)   SpO2 97%   BMI 32.98 kg/m   Gen: wd, wn, nad Skin unremarkable Neck nontender, normal range of motion, no mass no thyromegaly no lymphadenopathy Upper back nontender Left arm nontender to palpitation, there is some mild pain with Hawkins and crossover test, mild pain with shoulder range of motion in general but no laxity no deformity.  He does seem to have some tenderness with labral test.  Otherwise arm unremarkable Arms neurovascularly intact    Assessment: Encounter Diagnoses  Name Primary?  . Left arm pain Yes  . Paresthesia   . Elevated PSA   . Nail deformity   . Onychomycosis   . Tinea pedis, unspecified laterality     Plan: Left arm pain, paresthesia- we will send for x-ray of neck and shoulder, advised to use over-the-counter Aleve or ibuprofen for the next  5 to 7 days, arm sling 1 to 2 hours at a time throughout the day, relative rest, stretching.  No heavy lifting for now.  Follow-up pending x-ray  Elevated PSA-repeat labs today.  Discussed PSA values, risk and benefits of testing and what results may mean, pending labs possible referral to urology  Nail deformity, onychomycosis, tinea pedis- referral to podiatry   James Bird was seen today for follow up.  Diagnoses and all orders for this visit:  Left arm pain -     DG Cervical Spine Complete; Future -     DG Shoulder Left; Future  Paresthesia -     DG Cervical Spine Complete; Future -     DG Shoulder Left; Future  Elevated PSA -     PSA, total and free  Nail deformity -     Ambulatory referral to Podiatry  Onychomycosis -     Ambulatory referral to Podiatry  Tinea  pedis, unspecified laterality -     Ambulatory referral to Podiatry

## 2018-05-23 NOTE — Telephone Encounter (Signed)
Pt was called to advise of appt with Dr. Even of [facility] 06-06-18 at 1:45. LVM of office address and phone number to reach them should he need to chang appt. [place]

## 2018-05-24 DIAGNOSIS — L608 Other nail disorders: Secondary | ICD-10-CM | POA: Insufficient documentation

## 2018-05-24 DIAGNOSIS — B351 Tinea unguium: Secondary | ICD-10-CM | POA: Insufficient documentation

## 2018-05-24 DIAGNOSIS — R202 Paresthesia of skin: Secondary | ICD-10-CM | POA: Insufficient documentation

## 2018-05-24 DIAGNOSIS — R972 Elevated prostate specific antigen [PSA]: Secondary | ICD-10-CM | POA: Insufficient documentation

## 2018-05-24 DIAGNOSIS — B353 Tinea pedis: Secondary | ICD-10-CM | POA: Insufficient documentation

## 2018-05-24 DIAGNOSIS — M79602 Pain in left arm: Secondary | ICD-10-CM | POA: Insufficient documentation

## 2018-05-24 LAB — PSA, TOTAL AND FREE
PROSTATE SPECIFIC AG, SERUM: 4.4 ng/mL — AB (ref 0.0–4.0)
PSA FREE: 0.4 ng/mL
PSA, Free Pct: 9.1 %

## 2018-05-29 ENCOUNTER — Telehealth: Payer: Self-pay

## 2018-05-29 ENCOUNTER — Other Ambulatory Visit: Payer: Self-pay

## 2018-05-29 DIAGNOSIS — R972 Elevated prostate specific antigen [PSA]: Secondary | ICD-10-CM

## 2018-05-29 NOTE — Telephone Encounter (Signed)
Per lab results message, you already spoke to him.  review the results again with him, and make sure we referred to urology  Regarding splint, foot brace, we may need to call back to below to make sure they have the order I sent weeks ago about brace for foot drop.  [facility] [address], [place],  [address] [phone] phone

## 2018-05-29 NOTE — Telephone Encounter (Signed)
Patient called for lab results and wanted to know where you sent him to get a foot brace for drop foot at his CPE.  I did not to see anything in your note.

## 2018-05-30 NOTE — Telephone Encounter (Signed)
Patient notified

## 2018-06-06 ENCOUNTER — Ambulatory Visit: Payer: BC Managed Care – PPO | Admitting: Podiatry

## 2018-06-06 DIAGNOSIS — B353 Tinea pedis: Secondary | ICD-10-CM | POA: Diagnosis not present

## 2018-06-06 DIAGNOSIS — B351 Tinea unguium: Secondary | ICD-10-CM | POA: Diagnosis not present

## 2018-06-06 MED ORDER — CLOTRIMAZOLE-BETAMETHASONE 1-0.05 % EX CREA: 1.0000 "application " | TOPICAL_CREAM | Freq: Two times a day (BID) | CUTANEOUS | 2 refills | Status: DC

## 2018-06-06 MED ORDER — TERBINAFINE HCL 250 MG PO TABS: 250.0000 mg | ORAL_TABLET | Freq: Every day | ORAL | 0 refills | Status: DC

## 2018-06-10 NOTE — Progress Notes (Signed)
   Subjective: 57 year old male presenting today as a new patient with a chief complaint of possible fungus of bilateral toenails 1-5 that has been ongoing for more than 10 years. He reports associated discoloration and thickening of the nails. He states some of them are lifting from the nailbed and are tender to the touch. He has not done anything for treatment. Patient is here for further evaluation and treatment.   Past Medical History:  Diagnosis Date  . Allergy    RHINITS  . Chronic shoulder pain    left  . Constipation 2019  . Diverticulosis   . ED (erectile dysfunction)   . Former smoker    quit late 2018  . H/O echocardiogram 03/20/08   mild - moderate LVH, EF 50-55%, mild RV enlargement, mild mitral regurgitation, Dr. Verlon Setting  . 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 History of cardiovascular stress test 03/20/2008   dobutamine stress test, normal study, Dr. Verlon Setting  . History of neck surgery   . Hyperlipidemia   . Hypertension   . Impaired fasting glucose     Objective: Physical Exam General: The patient is alert and oriented x3 in no acute distress.  Dermatology: Hyperkeratotic, discolored, thickened, onychodystrophy of nails noted bilaterally. Pruritis and hyperkeratosis noted to bilateral feet. Skin is warm, dry and supple bilateral lower extremities. Negative for open lesions or macerations.  Vascular: Palpable pedal pulses bilaterally. No edema or erythema noted. Capillary refill within normal limits.  Neurological: Epicritic and protective threshold grossly intact bilaterally.   Musculoskeletal Exam: Range of motion within normal limits to all pedal and ankle joints bilateral. Muscle strength 5/5 in all groups bilateral.   Assessment: #1 onychomycosis bilateral toenails digits 1-5 #2 hyperkeratotic nails bilateral #3 Tinea pedis bilateral   Plan of Care:  #1 Patient was evaluated. #2 Prescription for Lamisil 250 mg #90 provided to patient.  #3  Prescription for Lotrisone cream provided to patient.  #4 Return to clinic in 6 months.     Edrick Kins, DPM [facility]  Dr. Edrick Kins, [address]                                        [place], [place] [address]                Office [phone]  Fax [phone]

## 2018-06-28 ENCOUNTER — Other Ambulatory Visit: Payer: Self-pay | Admitting: Medical

## 2018-06-28 DIAGNOSIS — M545 Low back pain, unspecified: Secondary | ICD-10-CM

## 2018-06-28 NOTE — Telephone Encounter (Signed)
Is this ok to refill?  

## 2018-07-11 IMAGING — MR MR LUMBAR SPINE W/O CM
4 of 5 series · 25 of 48 positions shown · non-contrast
Comparison: 01/12/2012

CLINICAL DATA: Back pain for over 6 weeks. Pain into the left
thigh.

EXAM:
MRI LUMBAR SPINE WITHOUT CONTRAST
TECHNIQUE: Multiplanar, multisequence MR imaging of the lumbar spine was
performed. No intravenous contrast was administered.

[Series 3: T2 · sagittal · 4.0mm · 0.59mm/px · 6 of 17 slices shown (1 of 2)]
[im 1/17]
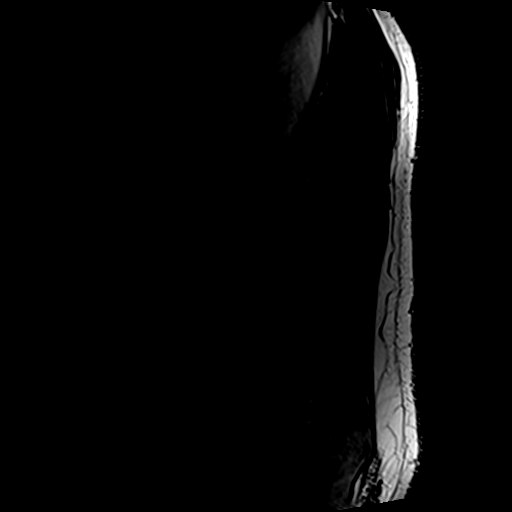
[im 4/17]
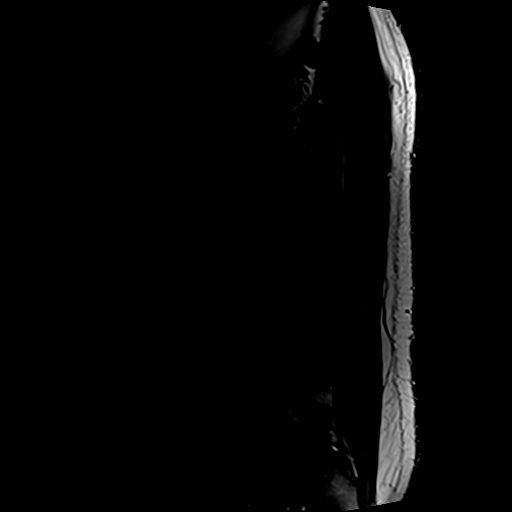
[im 7/17]
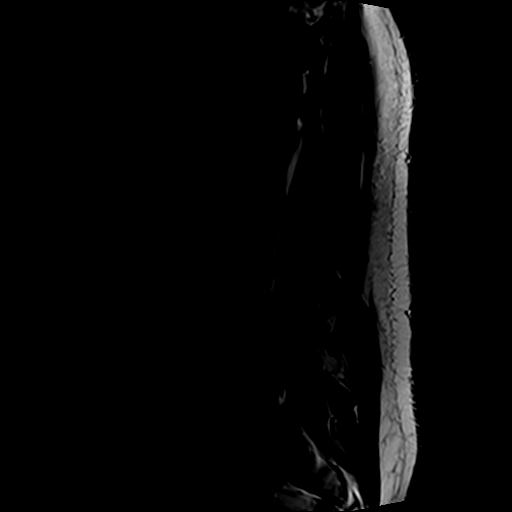
[im 10/17]
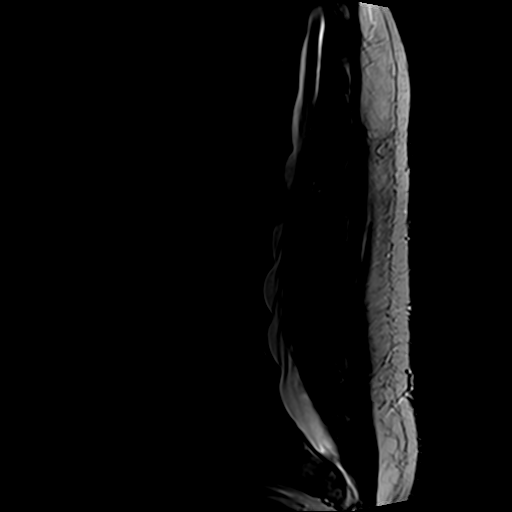
[im 13/17]
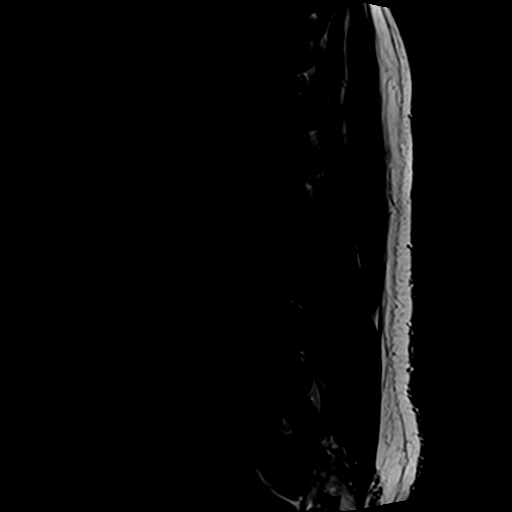
[im 17/17]
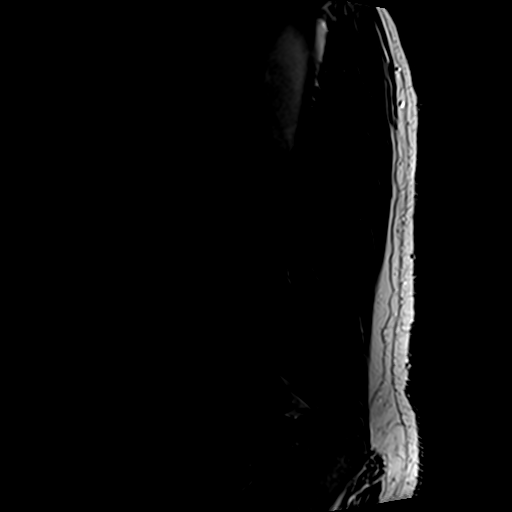

[Series 4: T1 · sagittal · 4.0mm · 0.59mm/px · 6 of 17 slices shown (1 of 2)]
[im 1/17]
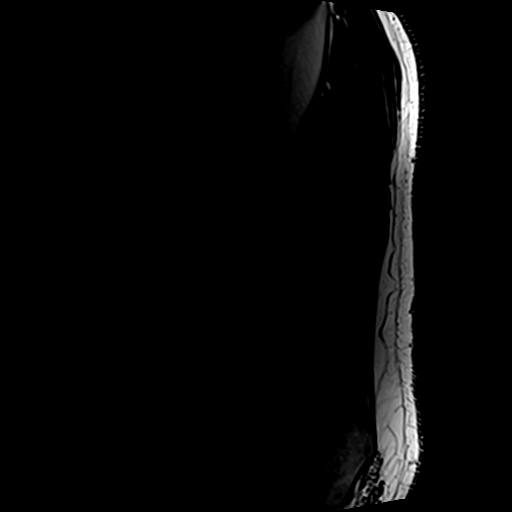
[im 4/17]
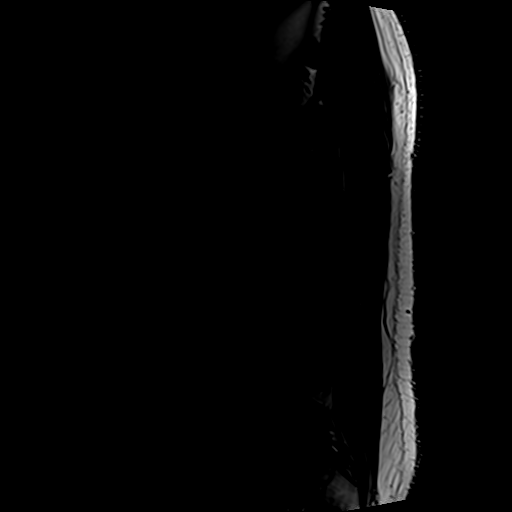
[im 7/17]
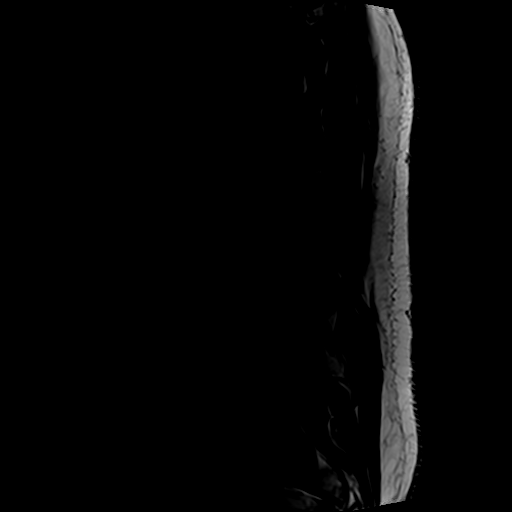
[im 10/17]
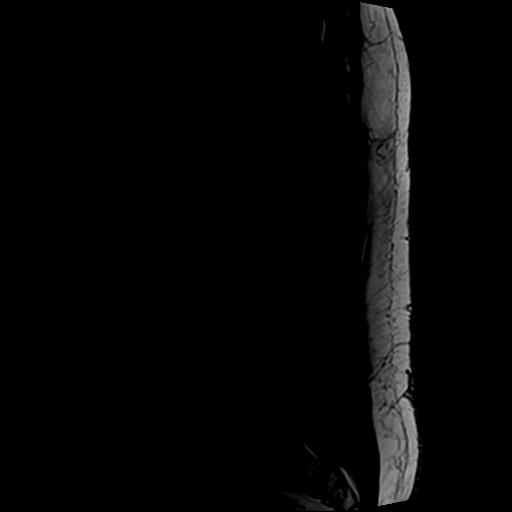
[im 13/17]
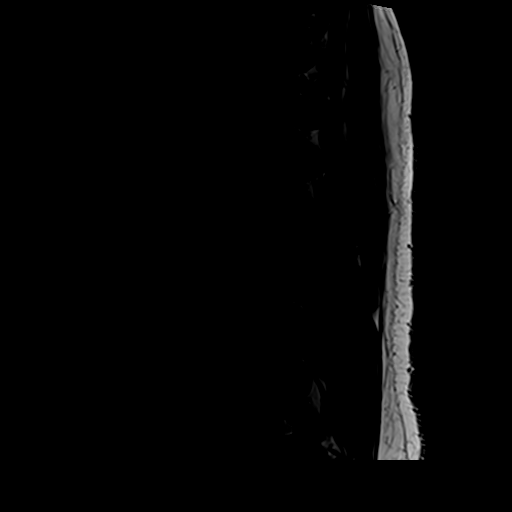
[im 17/17]
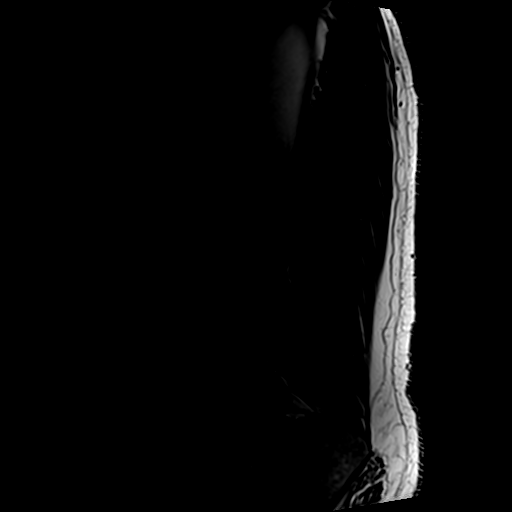

[Series 6: T2 · axial · 4.0mm · 0.70mm/px · z∈[-218,+16]mm · 9 of 45 slices shown (2 of 2)]
[im 1/45]
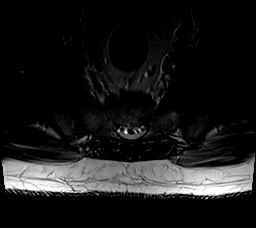
[im 7/45]
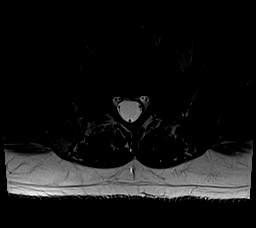
[im 13/45]
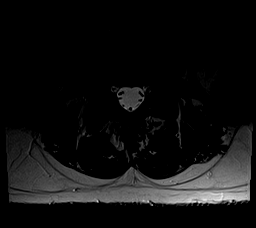
[im 19/45]
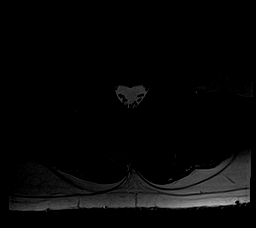
[im 23/45]
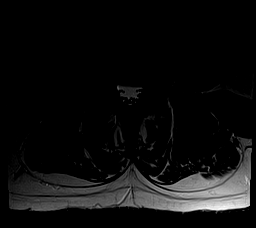
[im 26/45]
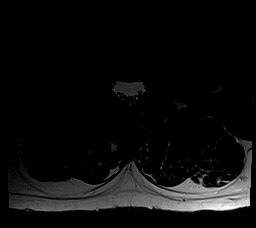
[im 32/45]
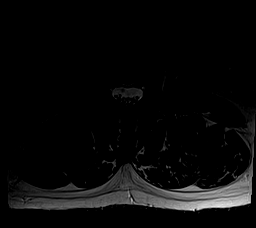
[im 38/45]
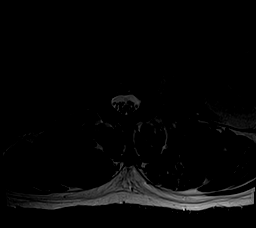
[im 45/45]
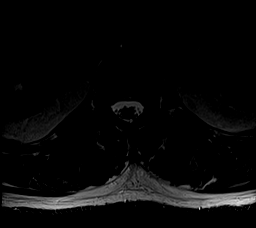

[Series 7: T1 · axial · 4.0mm · 0.35mm/px · z∈[-218,-19]mm · 4 of 45 slices shown (2 of 2)]
[im 1/45]
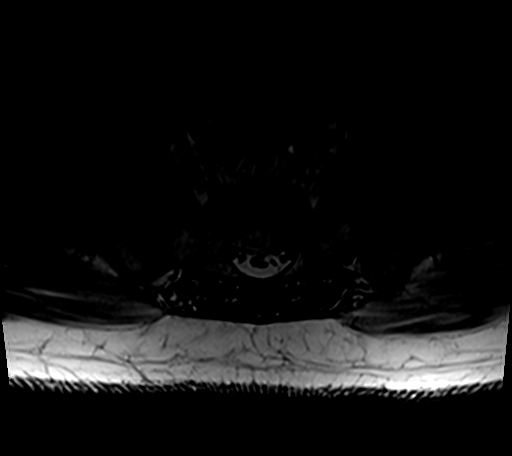
[im 7/45]
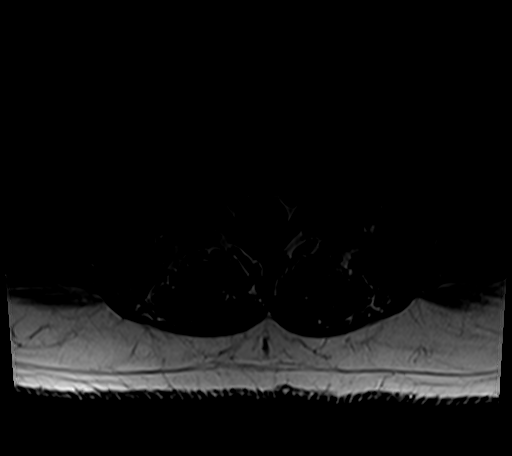
[im 23/45]
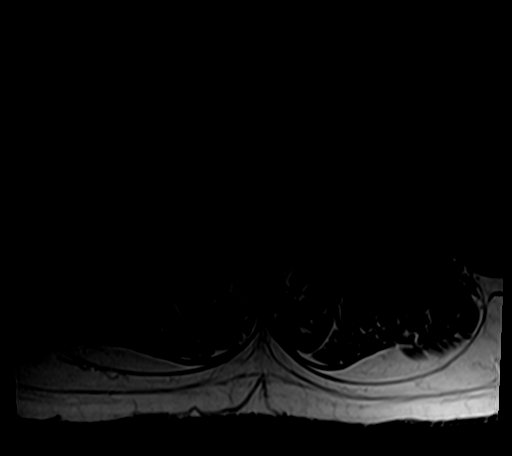
[im 38/45]
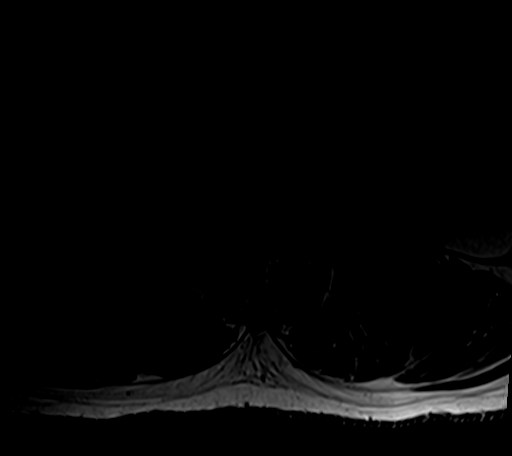

[25 of 48 positions shown; findings below may reference images not displayed]

FINDINGS: Segmentation:  Standard.

Alignment:  Normal

Vertebrae: Degenerative alteration of marrow about the L1-2 disc
space with Schmorl's nodes, progressed. Schmorl's node in the T12
inferior endplate with minimal signal abnormality. No acute
fracture, discitis, or aggressive bone lesion.

Conus medullaris: Extends to the L1 level and appears normal.

Paraspinal and other soft tissues: Negative

Disc levels:

T12- L1: Mild disc narrowing and bulging. Mild disc desiccation that
has developed since prior. No impingement.

L1-L2: Advanced disc degeneration with narrowing and bulging that
has progressed. Chronic left paracentral disc protrusion with left
subarticular recess impingement. The herniation continues to the
left foramen with left L1 impingement. Spinal stenosis is moderate.
Mild facet spurring.

L2-L3: Disc desiccation and narrowing with bulging. Negative facets.
No impingement

L3-L4: Degenerative disc narrowing and bulging. Degenerative facet
hypertrophy. Bilateral moderate foraminal narrowing. Patent spinal
canal

L4-L5: Moderate degenerative disc narrowing with bulging.
Degenerative facet hypertrophy that is progressed. Ventral
subarachnoid space deformity. Moderate bilateral foraminal
narrowing.

L5-S1:Negative disc space. Mild facet spurring that is progressed.
No impingement.
IMPRESSION: 1. L1-2 focal advanced disc degeneration with progressive disc
bulging and moderate spinal stenosis when compared to 0583. Chronic
left paracentral to foraminal disc protrusion with left L2 and L1
impingement. Moderate right foraminal stenosis.
2. L3-4 and L4-5 moderate foraminal narrowing from disc bulging and
height loss, greater on the right.
3. Degenerative facet arthropathy at L3-4 and below. Degenerative
spurring has progressed at L4-5 and L5-S1 when compared to prior.

## 2018-08-21 ENCOUNTER — Other Ambulatory Visit: Payer: Self-pay | Admitting: Medical

## 2018-09-13 ENCOUNTER — Ambulatory Visit: Payer: BC Managed Care – PPO | Admitting: Medical

## 2018-09-13 ENCOUNTER — Encounter: Payer: Self-pay | Admitting: Medical

## 2018-09-13 VITALS — BP 120/82 | HR 82 | Temp 99.2°F | Resp 16 | Ht 71.0 in | Wt 233.0 lb

## 2018-09-13 DIAGNOSIS — J988 Other specified respiratory disorders: Secondary | ICD-10-CM

## 2018-09-13 DIAGNOSIS — R52 Pain, unspecified: Secondary | ICD-10-CM

## 2018-09-13 DIAGNOSIS — R05 Cough: Secondary | ICD-10-CM

## 2018-09-13 DIAGNOSIS — R059 Cough, unspecified: Secondary | ICD-10-CM

## 2018-09-13 LAB — POCT INFLUENZA A/B
Influenza A, POC: NEGATIVE
Influenza B, POC: NEGATIVE

## 2018-09-13 NOTE — Progress Notes (Signed)
Subjective: Chief Complaint  Patient presents with  . cold    cough, sore throat, X tuesday   Here for 3-day history of sore throat, low grade fever fever, cough, somewhat moderate or medium cough, not mild, not severe.   Has had some chills and some body aches, some sweats 2 days ago.   Has headache, head congestion.  No nasal congestion, no rash, no NVD.  No sick contacts.  Using nyquil.  No other aggravating or relieving factors. No other complaint.   Past Medical History:  Diagnosis Date  . Allergy    RHINITS  . Chronic shoulder pain    left  . Constipation 2019  . Diverticulosis   . ED (erectile dysfunction)   . Former smoker    quit late 2018  . H/O echocardiogram 03/20/08   mild - moderate LVH, EF 50-55%, mild RV enlargement, mild mitral regurgitation, Dr. Verlon Setting  . 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 History of cardiovascular stress test 03/20/2008   dobutamine stress test, normal study, Dr. Verlon Setting  . History of neck surgery   . Hyperlipidemia   . Hypertension   . Impaired fasting glucose    Current Outpatient Medications on File Prior to Visit  Medication Sig Dispense Refill  . atorvastatin (LIPITOR) 20 MG tablet TAKE 1 TABLET(20 MG) BY MOUTH DAILY AT 6 PM 90 tablet 0  . clotrimazole-betamethasone (LOTRISONE) cream Apply 1 application topically 2 (two) times daily. 45 g 2  . doxycycline (VIBRA-TABS) 100 MG tablet Take 1 tablet (100 mg total) by mouth 2 (two) times daily. 28 tablet 0  . lisinopril-hydrochlorothiazide (PRINZIDE,ZESTORETIC) 20-12.5 MG tablet Take 1 tablet by mouth daily. 90 tablet 3  . meloxicam (MOBIC) 15 MG tablet TAKE 1 TABLET(15 MG) BY MOUTH DAILY 30 tablet 2  . terbinafine (LAMISIL) 250 MG tablet Take 1 tablet (250 mg total) by mouth daily. (Patient not taking: Reported on 09/13/2018) 90 tablet 0   No current facility-administered medications on file prior to visit.    ROS as in subjective    Objective: BP 120/82   Pulse 82   Temp  99.2 F (37.3 C) (Oral)   Resp 16   Ht 5\' 11"  (1.803 m)   Wt 233 lb (105.7 kg)   SpO2 95%   BMI 32.50 kg/m   General appearance: alert, no distress, WD/WN, mildly ill appearing HEENT: normocephalic, sclerae anicteric, TMs pearly, nares patent, clear discharge, mild erythema, pharynx mildly erythematous without swelling or exudate Oral cavity: MMM, no lesions Neck: supple, no lymphadenopathy, no thyromegaly, no masses Heart: RRR, normal S1, S2, no murmurs Lungs: CTA bilaterally, no wheezes, rhonchi, or rales Pulses: 2+ symmetric, upper and lower extremities, normal cap refill    Assessment: Encounter Diagnoses  Name Primary?  Marland Kitchen Respiratory tract infection Yes  . Body aches   . Cough      Plan: Flu swab negative  Symptoms and exam suggest viral cold.  We discussed supportive care, rest, hydration, salt gargles, warm fluids, over-the-counter analgesics, can continue NyQuil at night DayQuil during the day for the next several days.  If much worse or not improving over the next 4 to 5 days and call back.   James Bird was seen today for cold.  Diagnoses and all orders for this visit:  Respiratory tract infection -     Influenza A/B  Body aches -     Influenza A/B  Cough -     Influenza A/B

## 2018-09-20 ENCOUNTER — Telehealth: Payer: Self-pay | Admitting: Family Medicine

## 2018-09-20 MED ORDER — SILDENAFIL CITRATE 100 MG PO TABS: 50.0000 mg | ORAL_TABLET | Freq: Every day | ORAL | 11 refills | Status: DC | PRN

## 2018-09-20 MED ORDER — TADALAFIL 20 MG PO TABS: 20.0000 mg | ORAL_TABLET | Freq: Every day | ORAL | 5 refills | Status: DC | PRN

## 2018-09-20 NOTE — Telephone Encounter (Signed)
Let him know that I called the Viagra in

## 2018-09-20 NOTE — Telephone Encounter (Signed)
Pt just spoke to pharmacy and the insurance company will not cover viagra. Not sure if PA is on its way but may need alternative. [place]

## 2018-09-20 NOTE — Telephone Encounter (Signed)
Called pt but lost connection will try again later. [place]

## 2018-09-20 NOTE — Telephone Encounter (Signed)
Cialis called in

## 2018-09-20 NOTE — Telephone Encounter (Signed)
Pt wanted to know if he can start back getting his Viagra and what does he need to do so. Pt can be reached on his work phone at [phone]

## 2018-09-20 NOTE — Telephone Encounter (Signed)
Recv'd fax from [facility] that Sildenafil not covered, preferred alternative is Tadalafil and Vardenafil, do you want to switch?

## 2018-09-21 NOTE — Telephone Encounter (Signed)
LVM for pt and  Advised. [place]

## 2018-09-23 NOTE — Telephone Encounter (Signed)
Preferred alternative is Cialis, pt was switched

## 2018-11-27 ENCOUNTER — Other Ambulatory Visit: Payer: Self-pay | Admitting: Medical

## 2019-01-02 ENCOUNTER — Encounter: Payer: Self-pay | Admitting: Medical

## 2019-01-02 ENCOUNTER — Other Ambulatory Visit: Payer: Self-pay

## 2019-01-02 ENCOUNTER — Ambulatory Visit: Payer: BC Managed Care – PPO | Admitting: Medical

## 2019-01-02 VITALS — Ht 71.5 in | Wt 240.0 lb

## 2019-01-02 DIAGNOSIS — L237 Allergic contact dermatitis due to plants, except food: Secondary | ICD-10-CM

## 2019-01-02 MED ORDER — TRIAMCINOLONE ACETONIDE 0.1 % EX CREA: 1.0000 "application " | TOPICAL_CREAM | Freq: Two times a day (BID) | CUTANEOUS | 0 refills | Status: DC

## 2019-01-02 MED ORDER — PREDNISONE 10 MG PO TABS: ORAL_TABLET | ORAL | 0 refills | Status: DC

## 2019-01-02 NOTE — Progress Notes (Signed)
Subjective:     Patient ID: James Bird, male   DOB: [birth year], 58 y.o.   MRN: [id number]  This visit type was conducted due to national recommendations for restrictions regarding the COVID-19 Pandemic (e.g. social distancing) in an effort to limit this patient's exposure and mitigate transmission in our community.   This format is felt to be most appropriate for this patient at this time.    Documentation for virtual audio and video telecommunications through Zoom encounter:  The patient was located at home. The provider was located in the office. The patient did consent to this visit and is aware of possible charges through their insurance for this visit.  The other persons participating in this telemedicine service were none. Time spent on call was 15 minutes and in review of previous records >18 minutes total.  This virtual service is not related to other E/M service within previous 7 days.   HPI Chief Complaint  Patient presents with  . poison ivy    poison ivy X Monday   Virtual visit today for possible poison ivy.  Sunday 3 days ago was doing yard work, weeding, cutting down some trees and brush.  Normally he would go and take a shower immediately after doing this type of work but he had to take a chainsaw back to [place].  Thus there was a delay in the washing and getting plant debris off of his body.  Within a day started getting a rash on his arms and face, and over the course of the last 2 days now feels irritation of the skin of his legs and he thinks he is going get rash there too.  He came in here last year about the same time and saw Dr. Redmond School for similar poison ivy rash.  He is using a topical Tylenol lotion as well as calamine lotion.  No other aggravating or relieving factors.  No other new exposure.  No other complaint.  Past Medical History:  Diagnosis Date  . Allergy    RHINITS  . Chronic shoulder pain    left  . Constipation 2019  . Diverticulosis   .  ED (erectile dysfunction)   . Former smoker    quit late 2018  . H/O echocardiogram 03/20/08   mild - moderate LVH, EF 50-55%, mild RV enlargement, mild mitral regurgitation, Dr. Verlon Setting  . 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 History of cardiovascular stress test 03/20/2008   dobutamine stress test, normal study, Dr. Verlon Setting  . History of neck surgery   . Hyperlipidemia   . Hypertension   . Impaired fasting glucose    Current Outpatient Medications on File Prior to Visit  Medication Sig Dispense Refill  . atorvastatin (LIPITOR) 20 MG tablet TAKE 1 TABLET(20 MG) BY MOUTH DAILY AT 6 PM 90 tablet 0  . lisinopril-hydrochlorothiazide (PRINZIDE,ZESTORETIC) 20-12.5 MG tablet Take 1 tablet by mouth daily. 90 tablet 3  . meloxicam (MOBIC) 15 MG tablet TAKE 1 TABLET(15 MG) BY MOUTH DAILY 30 tablet 2  . clotrimazole-betamethasone (LOTRISONE) cream Apply 1 application topically 2 (two) times daily. (Patient not taking: Reported on 01/02/2019) 45 g 2  . tadalafil (CIALIS) 20 MG tablet Take 1 tablet (20 mg total) by mouth daily as needed for erectile dysfunction. (Patient not taking: Reported on 01/02/2019) 10 tablet 5  . terbinafine (LAMISIL) 250 MG tablet Take 1 tablet (250 mg total) by mouth daily. (Patient not taking: Reported on 09/13/2018)  90 tablet 0   No current facility-administered medications on file prior to visit.     Review of Systems As in subjective    Objective:   Physical Exam  Ht 5' 11.5" (1.816 m)   Wt 240 lb (108.9 kg)   BMI 33.01 kg/m   Due to coronavirus pandemic stay at home measures, patient visit was virtual and they were not examined in person.   General well-developed well-nourished no acute distress Skin: bilat forearms with patches of urticarial small pink/ red round and linear lesions suggestive of poison ivy dermatitis      Assessment:     Encounter Diagnosis  Name Primary?  . Poison ivy dermatitis Yes       Plan:     We discussed symptoms, exam  findings.  Advise he make sure all the close he was wearing when he was working outside gets washed in the hot cycle in the wash machine.  Avoid additional exposure.  I advised Benadryl over-the-counter 25 mg at bedtime, can use 12.5 or 25 mg during the daytime twice daily the next few days.  Begin triamcinolone cream to the torso arms or legs but only use hydrocortisone cream over-the-counter for the facial rash.  Can begin prednisone as below.  Discussed risk benefits and proper use of medication.  Follow-up if not improving in the next several days as discussed  Kaine was seen today for poison ivy.  Diagnoses and all orders for this visit:  Poison ivy dermatitis  Other orders -     triamcinolone cream (KENALOG) 0.1 %; Apply 1 application topically 2 (two) times daily. -     predniSONE (DELTASONE) 10 MG tablet; 6/5/4/3/2/1

## 2019-02-21 ENCOUNTER — Ambulatory Visit: Payer: BC Managed Care – PPO | Admitting: Medical

## 2019-02-21 ENCOUNTER — Other Ambulatory Visit: Payer: Self-pay

## 2019-02-21 ENCOUNTER — Encounter: Payer: Self-pay | Admitting: Medical

## 2019-02-21 VITALS — BP 130/72 | HR 80 | Temp 98.8°F | Resp 16 | Wt 234.2 lb

## 2019-02-21 DIAGNOSIS — Z8249 Family history of ischemic heart disease and other diseases of the circulatory system: Secondary | ICD-10-CM | POA: Diagnosis not present

## 2019-02-21 DIAGNOSIS — I10 Essential (primary) hypertension: Secondary | ICD-10-CM

## 2019-02-21 DIAGNOSIS — E782 Mixed hyperlipidemia: Secondary | ICD-10-CM

## 2019-02-21 DIAGNOSIS — M19012 Primary osteoarthritis, left shoulder: Secondary | ICD-10-CM | POA: Diagnosis not present

## 2019-02-21 NOTE — Progress Notes (Signed)
Subjective: Chief Complaint  Patient presents with  . shoulder pain    left shoulder pain X sunday    Here for left shoulder pain x 5 days ago. This pain has persisted.  Denies injury trauma or fall.   Started gradually on Sunday.  Felt in some in left neck and in shoulder down to upper left arm.   No numbness or tingling in left arm.   No pain radiating down the arm to hand.  No popping in the shoulder.   He wondered if he slept wrong to cause the shoulder pain.      Father died of heart attack.  He was wanting some reassuring of his pain   denies chest pain, no shortness of breath, no palpitations, no syncope.  No swelling in the legs.  He is compliant with his blood pressure and cholesterol medications.  He denies dyspnea on exertion.  Sleeps on left side a lot.  During sleep he sometimes gets numbness on his shoulder.  He recently ordered a new mattress and is waiting on that to be delivered.  He is right-handed.    He has been quit smoking for a long time but during the [place] pandemic he got bored and had picked up smoking some.  He is frustrated at himself for doing this.  He is not smoking all the time.  He may try some nicotine patches.  He had done well with Chantix in the past.  No other complaint  Past Medical History:  Diagnosis Date  . Allergy    RHINITS  . Chronic shoulder pain    left  . Constipation 2019  . Diverticulosis   . ED (erectile dysfunction)   . Former smoker    quit late 2018  . H/O echocardiogram 03/20/08   mild - moderate LVH, EF 50-55%, mild RV enlargement, mild mitral regurgitation, Dr. Verlon Setting  . 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 History of cardiovascular stress test 03/20/2008   dobutamine stress test, normal study, Dr. Verlon Setting  . History of neck surgery   . Hyperlipidemia   . Hypertension   . Impaired fasting glucose     Current Outpatient Medications on File Prior to Visit  Medication Sig Dispense Refill  . atorvastatin (LIPITOR) 20 MG  tablet TAKE 1 TABLET(20 MG) BY MOUTH DAILY AT 6 PM 90 tablet 0  . lisinopril-hydrochlorothiazide (PRINZIDE,ZESTORETIC) 20-12.5 MG tablet Take 1 tablet by mouth daily. 90 tablet 3  . meloxicam (MOBIC) 15 MG tablet TAKE 1 TABLET(15 MG) BY MOUTH DAILY 30 tablet 2  . triamcinolone cream (KENALOG) 0.1 % Apply 1 application topically 2 (two) times daily. 45 g 0  . tadalafil (CIALIS) 20 MG tablet Take 1 tablet (20 mg total) by mouth daily as needed for erectile dysfunction. (Patient not taking: Reported on 01/02/2019) 10 tablet 5  . terbinafine (LAMISIL) 250 MG tablet Take 1 tablet (250 mg total) by mouth daily. (Patient not taking: Reported on 09/13/2018) 90 tablet 0   No current facility-administered medications on file prior to visit.     Family History  Problem Relation Age of Onset  . Cancer Mother        LUNG  . Heart disease Father 70       MI  . Cancer Paternal Aunt   . Cancer Paternal Uncle   . Hypertension Brother   . Diabetes Neg Hx   . Stroke Neg Hx      Objective:  BP 130/72   Pulse 80   Temp 98.8 F (37.1 C) (Oral)   Resp 16   Wt 234 lb 3.2 oz (106.2 kg)   SpO2 98%   BMI 32.21 kg/m   Wt Readings from Last 3 Encounters:  02/21/19 234 lb 3.2 oz (106.2 kg)  01/02/19 240 lb (108.9 kg)  09/13/18 233 lb (105.7 kg)   General well-developed well-nourished no acute distress Neck nontender normal range of motion no mass or thyromegaly no lymphadenopathy Lungs clear  heart regular rate and rhythm, normal S1-S2, no murmurs  no edema Pulses 2+ upper and lower extremity Left shoulder with some popping noted with range of motion but relatively normal range of motion today on exam, no laxity, nontender to palpation, no swelling He is tender over the supraspinatus, mild tender upper back Otherwise back nontender normal range of motion Arms neurovascularly intact   Assessment: Encounter Diagnoses  Name Primary?  . Inflammation of joint of left shoulder region Yes  .  Essential hypertension, benign   . Mixed hyperlipidemia   . Family history of heart disease      Plan: Inflammation of left shoulder-I advised that his current symptoms suggest musculoskeletal inflammation and not a cardiac origin.  Advised he use Aleve for the next 2 days, do some stretching and can use heat therapy, massage.  If symptoms do not improve over the next several days call back.  He had much worse shoulder pain several months ago but he never went for x-ray.  He may have some underlying arthritis or rotator cuff inflammation.  I advised that he could go get the x-ray he never followed up with if desired.  The order still was in the computer.  We discussed his overall risk factors for heart disease including age over 18, hypertension, hyperlipidemia, family history of heart disease.  I advised he continue to work on healthy diet and exercise and try to lose some weight.  I reviewed his last lipid panel labs from last fall.  I reviewed his cardiology visit he had last fall as well.  At that time cardiology did not feel he needed further testing.  I reviewed his last EKG from last fall.  Advised he quit smoking and not pick these back up.  If he calls back and wants to go back on Chantix I will send this out.  Follow-up in September 2020 with me for  routine physical.   James Bird was seen today for shoulder pain.  Diagnoses and all orders for this visit:  Inflammation of joint of left shoulder region  Essential hypertension, benign  Mixed hyperlipidemia  Family history of heart disease

## 2019-02-22 ENCOUNTER — Telehealth: Payer: Self-pay | Admitting: Medical

## 2019-02-22 ENCOUNTER — Other Ambulatory Visit: Payer: Self-pay | Admitting: Medical

## 2019-02-22 MED ORDER — CHANTIX STARTING MONTH PAK 0.5 MG X 11 & 1 MG X 42 PO TABS: ORAL_TABLET | ORAL | 0 refills | Status: DC

## 2019-02-22 NOTE — Telephone Encounter (Signed)
Pt called and does want Chantix rx sent in  [facility]

## 2019-02-25 ENCOUNTER — Telehealth: Payer: Self-pay | Admitting: Medical

## 2019-02-25 ENCOUNTER — Other Ambulatory Visit: Payer: Self-pay | Admitting: Medical

## 2019-02-25 MED ORDER — CHANTIX STARTING MONTH PAK 0.5 MG X 11 & 1 MG X 42 PO TABS: ORAL_TABLET | ORAL | 0 refills | Status: DC

## 2019-02-25 NOTE — Telephone Encounter (Signed)
I just sent it to [facility]  (although the message I received [facility] )

## 2019-02-25 NOTE — Telephone Encounter (Signed)
Pt was in office last week and had a refill for Chantix that was sent to the wrong pharmacy. Pt uses the [facility] [address]

## 2019-02-26 NOTE — Telephone Encounter (Signed)
Pt.notified

## 2019-03-18 ENCOUNTER — Other Ambulatory Visit: Payer: Self-pay | Admitting: Family Medicine

## 2019-05-14 ENCOUNTER — Other Ambulatory Visit: Payer: Self-pay | Admitting: Internal Medicine

## 2019-05-17 ENCOUNTER — Other Ambulatory Visit: Payer: BC Managed Care – PPO

## 2019-05-17 ENCOUNTER — Other Ambulatory Visit: Payer: Self-pay

## 2019-06-13 ENCOUNTER — Other Ambulatory Visit: Payer: Self-pay | Admitting: Medical

## 2019-07-22 ENCOUNTER — Other Ambulatory Visit: Payer: Self-pay | Admitting: Medical

## 2019-07-22 DIAGNOSIS — M545 Low back pain, unspecified: Secondary | ICD-10-CM

## 2019-08-10 ENCOUNTER — Other Ambulatory Visit: Payer: Self-pay | Admitting: Internal Medicine

## 2019-08-21 DIAGNOSIS — Z0279 Encounter for issue of other medical certificate: Secondary | ICD-10-CM

## 2019-10-18 ENCOUNTER — Other Ambulatory Visit: Payer: Self-pay | Admitting: Medical

## 2019-11-15 ENCOUNTER — Other Ambulatory Visit: Payer: Self-pay | Admitting: Family Medicine

## 2019-11-15 DIAGNOSIS — M545 Low back pain, unspecified: Secondary | ICD-10-CM

## 2020-01-06 ENCOUNTER — Encounter: Payer: Self-pay | Admitting: Family Medicine

## 2020-01-06 ENCOUNTER — Telehealth: Payer: BC Managed Care – PPO | Admitting: Family Medicine

## 2020-01-06 VITALS — Wt 248.0 lb

## 2020-01-06 DIAGNOSIS — L237 Allergic contact dermatitis due to plants, except food: Secondary | ICD-10-CM | POA: Diagnosis not present

## 2020-01-06 MED ORDER — PREDNISONE 10 MG (21) PO TBPK: ORAL_TABLET | Freq: Every day | ORAL | 0 refills | Status: DC

## 2020-01-06 NOTE — Progress Notes (Signed)
   Subjective:  Documentation for virtual audio and video telecommunications through [place] encounter:  The patient was located at home. 2 patient identifiers used.  The provider was located in the office. The patient did consent to this visit and is aware of possible charges through their insurance for this visit.  The other persons participating in this telemedicine service were none. Time spent on call was 11 minutes and in review of previous records 15 minutes total.  This virtual service is not related to other E/M service within previous 7 days.   Patient ID: James Bird, male    DOB: 1960-11-20, 59 y.o.   MRN: [id number]  HPI Chief Complaint  Patient presents with  . other    virtual acute poison ivy started last week trying to treat it rash and itching leg and forearm above the kne.   Complains of a one week history of a pruritic rash on both arms and legs. States his forehead has been itching lately but no rash yet.  States he has been exposed to poison ivy and this happens to him around this time of the year when he starts working in his yard. States he usually has to have oral steroids and topical. He has been using triamcinolone but the rash is still worsening.   Denies fever, chills, cough, shortness of breath, N/V/D.   Denies any major inflammation, exudate or sign of infection.    Review of Systems Pertinent positives and negatives in the history of present illness.     Objective:   Physical Exam Wt 248 lb (112.5 kg)   BMI 34.11 kg/m   Difficulty to visualize rash on video but I did see a linear rash on his right upper thigh and right forearm.       Assessment & Plan:  Contact dermatitis due to poison ivy - Plan: predniSONE (STERAPRED UNI-PAK 21 TAB) 10 MG (21) TBPK tablet  Discussed limitations of a virtual visit especially for rash.  No sign of infection. He feels that the rash is spreading so I will prescribe him oral steroids. He has tolerated  these well in the past. Discussed potential side effects Continue using topical steroid, he has plenty of this at home. May use Benadryl at bedtime for itching. Also recommend cool showers and compresses for itching. Follow up if worsening.

## 2020-01-20 ENCOUNTER — Other Ambulatory Visit: Payer: Self-pay

## 2020-01-20 ENCOUNTER — Ambulatory Visit: Payer: BC Managed Care – PPO | Admitting: Family Medicine

## 2020-01-20 ENCOUNTER — Encounter: Payer: Self-pay | Admitting: Family Medicine

## 2020-01-20 VITALS — BP 142/86 | HR 83 | Temp 98.0°F | Wt 238.8 lb

## 2020-01-20 DIAGNOSIS — R972 Elevated prostate specific antigen [PSA]: Secondary | ICD-10-CM

## 2020-01-20 DIAGNOSIS — R1031 Right lower quadrant pain: Secondary | ICD-10-CM | POA: Diagnosis not present

## 2020-01-20 DIAGNOSIS — R1032 Left lower quadrant pain: Secondary | ICD-10-CM

## 2020-01-20 LAB — HEMOCCULT GUIAC POC 1CARD (OFFICE): Fecal Occult Blood, POC: NEGATIVE

## 2020-01-20 NOTE — Progress Notes (Signed)
   Subjective:    Patient ID: James Bird, male    DOB: Sep 17, 1960, 59 y.o.   MRN: [id number]  HPI He complains of a 2-week history of inguinal/lower abdominal discomfort. He also has a history of constipation but this has been going on a long time and states that the discomfort does not go away with a BM. He did have a colonoscopy in 2012. He has had no urinary frequency, dysuria, urgency or discharge. He has had a previous history of elevated PSAs and does have 2 negative biopsies. He however he has another appointment set up with urology for follow-up.   Review of Systems     Objective:   Physical Exam Alert and in no distress. Lower abdominal exam does show some slight discomfort in the suprapubic area but no masses were noted. Penis and testes normal. Prostate exam does show a slightly large nontender prostate. Stool was guaiac negative.       Assessment & Plan:  Abdominal discomfort, bilateral lower quadrant - Plan: Ambulatory referral to Gastroenterology  Elevated PSA At this point his symptoms are really unclear. It does not point towards his bowel habits which certainly show some constipation. I will set him up for colonoscopy since he is at the 9-year timeframe and is now having some potential GI issues. Same thing can be said about his prostate although he has symptoms and do not relate either 1. Also recommend he set up for complete exam after he turns 49.

## 2020-02-04 ENCOUNTER — Other Ambulatory Visit: Payer: Self-pay | Admitting: Medical

## 2020-02-04 NOTE — Telephone Encounter (Signed)
Has upcoming appt °

## 2020-02-12 ENCOUNTER — Encounter: Payer: BC Managed Care – PPO | Admitting: Medical

## 2020-02-14 ENCOUNTER — Ambulatory Visit: Payer: BC Managed Care – PPO | Admitting: Nurse Practitioner

## 2020-03-09 ENCOUNTER — Other Ambulatory Visit: Payer: Self-pay | Admitting: Family Medicine

## 2020-03-11 ENCOUNTER — Encounter: Payer: Self-pay | Admitting: Medical

## 2020-03-11 ENCOUNTER — Ambulatory Visit: Payer: BC Managed Care – PPO | Admitting: Medical

## 2020-03-11 ENCOUNTER — Other Ambulatory Visit: Payer: Self-pay

## 2020-03-11 VITALS — BP 132/84 | HR 85 | Ht 71.0 in | Wt 239.0 lb

## 2020-03-11 DIAGNOSIS — E782 Mixed hyperlipidemia: Secondary | ICD-10-CM

## 2020-03-11 DIAGNOSIS — R7301 Impaired fasting glucose: Secondary | ICD-10-CM | POA: Diagnosis not present

## 2020-03-11 DIAGNOSIS — E669 Obesity, unspecified: Secondary | ICD-10-CM

## 2020-03-11 DIAGNOSIS — M1712 Unilateral primary osteoarthritis, left knee: Secondary | ICD-10-CM

## 2020-03-11 DIAGNOSIS — Z7189 Other specified counseling: Secondary | ICD-10-CM

## 2020-03-11 DIAGNOSIS — Z8249 Family history of ischemic heart disease and other diseases of the circulatory system: Secondary | ICD-10-CM | POA: Diagnosis not present

## 2020-03-11 DIAGNOSIS — Z23 Encounter for immunization: Secondary | ICD-10-CM | POA: Diagnosis not present

## 2020-03-11 DIAGNOSIS — Z7185 Encounter for immunization safety counseling: Secondary | ICD-10-CM

## 2020-03-11 DIAGNOSIS — R972 Elevated prostate specific antigen [PSA]: Secondary | ICD-10-CM

## 2020-03-11 DIAGNOSIS — E66811 Obesity, class 1: Secondary | ICD-10-CM

## 2020-03-11 DIAGNOSIS — Z0000 Encounter for general adult medical examination without abnormal findings: Secondary | ICD-10-CM | POA: Diagnosis not present

## 2020-03-11 DIAGNOSIS — N528 Other male erectile dysfunction: Secondary | ICD-10-CM

## 2020-03-11 DIAGNOSIS — I10 Essential (primary) hypertension: Secondary | ICD-10-CM

## 2020-03-11 DIAGNOSIS — Z6831 Body mass index (BMI) 31.0-31.9, adult: Secondary | ICD-10-CM

## 2020-03-11 DIAGNOSIS — M5416 Radiculopathy, lumbar region: Secondary | ICD-10-CM

## 2020-03-11 MED ORDER — LINACLOTIDE 72 MCG PO CAPS: 72.0000 ug | ORAL_CAPSULE | Freq: Every day | ORAL | 0 refills | Status: DC

## 2020-03-11 NOTE — Progress Notes (Signed)
Subjective:   HPI  James Bird is a 59 y.o. male who presents for Chief Complaint  Patient presents with  . Annual Exam    with fasting labs     Patient Care Team: Janitza Revuelta, Leward Quan as PCP - General (Family Medicine)  Dentist  Eye doctor, none currently  Dr. Dorris Carnes, cardiology  Dr. Daylene Katayama, podiatry  Dr. Scarlette Shorts, Gastroenterology   Concerns: No pressing issues  Having some issues with constipation.  Only has about that about every 3 days.  Needs an eye doctor.  Reviewed their medical, surgical, family, social, medication, and allergy history and updated chart as appropriate.  Past Medical History:  Diagnosis Date  . Allergy    RHINITS  . Chronic shoulder pain    left  . Constipation 2019  . Diverticulosis   . ED (erectile dysfunction)   . Former smoker    quit late 2018  . H/O echocardiogram 03/20/08   mild - moderate LVH, EF 50-55%, mild RV enlargement, mild mitral regurgitation, Dr. Verlon Setting  . 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 History of cardiovascular stress test 03/20/2008   dobutamine stress test, normal study, Dr. Verlon Setting  . History of neck surgery   . Hyperlipidemia   . Hypertension   . Impaired fasting glucose     Past Surgical History:  Procedure Laterality Date  . cdisc & fusion  2011   Dr. Arnoldo Morale  . COLONOSCOPY  10/2010   diverticulosis, othewrise normal, repeat 10 years, Dr. Henrene Pastor  . SPINE SURGERY  2011(JENKINS,MD)   CERVICAL DISC AND FUSION      Family History  Problem Relation Age of Onset  . Cancer Mother        LUNG  . Heart disease Father 78       MI  . Cancer Paternal Aunt   . Cancer Paternal Uncle   . Hypertension Brother   . Diabetes Neg Hx   . Stroke Neg Hx      Current Outpatient Medications:  .  atorvastatin (LIPITOR) 20 MG tablet, TAKE 1 TABLET(20 MG) BY MOUTH DAILY AT 6 PM, Disp: 90 tablet, Rfl: 0 .  chlorhexidine (PERIDEX) 0.12 % solution, 15 mLs 2 (two) times daily., Disp: ,  Rfl:  .  lisinopril-hydrochlorothiazide (ZESTORETIC) 20-12.5 MG tablet, TAKE 1 TABLET BY MOUTH EVERY DAY, Disp: 90 tablet, Rfl: 3 .  meloxicam (MOBIC) 15 MG tablet, TAKE 1 TABLET(15 MG) BY MOUTH DAILY, Disp: 30 tablet, Rfl: 2 .  linaclotide (LINZESS) 72 MCG capsule, Take 1 capsule (72 mcg total) by mouth daily before breakfast., Disp: 14 capsule, Rfl: 0 .  predniSONE (STERAPRED UNI-PAK 21 TAB) 10 MG (21) TBPK tablet, Take by mouth daily. Tapering dose as recommended. (Patient not taking: Reported on 01/20/2020), Disp: 21 tablet, Rfl: 0 .  tadalafil (CIALIS) 20 MG tablet, Take 1 tablet (20 mg total) by mouth daily as needed for erectile dysfunction. (Patient not taking: Reported on 01/02/2019), Disp: 10 tablet, Rfl: 5 .  terbinafine (LAMISIL) 250 MG tablet, Take 1 tablet (250 mg total) by mouth daily. (Patient not taking: Reported on 09/13/2018), Disp: 90 tablet, Rfl: 0 .  triamcinolone cream (KENALOG) 0.1 %, Apply 1 application topically 2 (two) times daily. (Patient not taking: Reported on 01/20/2020), Disp: 45 g, Rfl: 0 .  varenicline (CHANTIX STARTING MONTH PAK) 0.5 MG X 11 & 1 MG X 42 tablet, Take one 0.5 mg tablet by mouth once daily for  3 days, then increase to one 0.5 mg tablet twice daily for 4 days, then increase to one 1 mg tablet twice daily. (Patient not taking: Reported on 01/06/2020), Disp: 53 tablet, Rfl: 0  No Known Allergies     Review of Systems Constitutional: -fever, -chills, -sweats, -unexpected weight change, -decreased appetite, -fatigue Allergy: -sneezing, -itching, -congestion Dermatology: -changing moles, --rash, -lumps ENT: -runny nose, -ear pain, -sore throat, -hoarseness, -sinus pain, -teeth pain, - ringing in ears, -hearing loss, -nosebleeds Cardiology: -chest pain, -palpitations, -swelling, -difficulty breathing when lying flat, -waking up short of breath Respiratory: -cough, -shortness of breath, -difficulty breathing with exercise or exertion, -wheezing, -coughing up  blood Gastroenterology: -abdominal pain, -nausea, -vomiting, -diarrhea, +constipation, -blood in stool, -changes in bowel movement, -difficulty swallowing or eating Hematology: -bleeding, -bruising  Musculoskeletal: -joint aches, -muscle aches, -joint swelling, -back pain, -neck pain, -cramping, -changes in gait Ophthalmology: denies vision changes, eye redness, itching, discharge Urology: -burning with urination, -difficulty urinating, -blood in urine, -urinary frequency, -urgency, -incontinence Neurology: -headache, -weakness, -tingling, -numbness, -memory loss, -falls, -dizziness Psychology: -depressed mood, -agitation, -sleep problems Male GU: no testicular mass, pain, no lymph nodes swollen, no swelling, no rash.     Objective:  BP (!) 132/84   Pulse 85   Ht 5\' 11"  (1.803 m)   Wt (!) 239 lb (108.4 kg)   SpO2 95%   BMI 33.33 kg/m   General appearance: alert, no distress, WD/WN, African American male Skin: Scattered macules, no worrisome lesions Neck: supple, no lymphadenopathy, no thyromegaly, no masses, normal ROM, no bruits Chest: non tender, normal shape and expansion Heart: RRR, normal S1, S2, no murmurs Lungs: CTA bilaterally, no wheezes, rhonchi, or rales Abdomen: +bs, soft, non tender, non distended, no masses, no hepatomegaly, no splenomegaly, no bruits Back: non tender, normal ROM, no scoliosis Musculoskeletal: upper extremities non tender, no obvious deformity, normal ROM throughout, lower extremities non tender, no obvious deformity, normal ROM throughout Extremities: no edema, no cyanosis, no clubbing Pulses: 2+ symmetric, upper and lower extremities, normal cap refill Neurological: alert, oriented x 3, CN2-12 intact, strength normal upper extremities and lower extremities, sensation normal throughout, DTRs 2+ throughout, no cerebellar signs, gait normal Psychiatric: normal affect, behavior normal, pleasant  GU/rectal-deferred to urology    Assessment and Plan :    Encounter Diagnoses  Name Primary?  . Routine general medical examination at a health care facility Yes  . Essential hypertension, benign   . Impaired fasting blood sugar   . Family history of heart disease   . Elevated PSA   . Vaccine counseling   . Mixed hyperlipidemia   . Other male erectile dysfunction   . Class 1 obesity with serious comorbidity and body mass index (BMI) of 31.0 to 31.9 in adult, unspecified obesity type   . Osteoarthritis of left knee, unspecified osteoarthritis type   . Lumbar radiculopathy   . Need for Td vaccine     Physical exam - discussed and counseled on healthy lifestyle, diet, exercise, preventative care, vaccinations, sick and well care, proper use of emergency dept and after hours care, and addressed their concerns.    Health screening: See your eye doctor yearly for routine vision care. See your dentist yearly for routine dental care including hygiene visits twice yearly.  Cancer screening Prostate elevation-managed by urology, reviewed the recent July 2021 notes, they are having a 65-month follow-up at this point, considering MRI of the prostate, prior prostate biopsies already done  Due for colon cancer screening 2022  We discussed skin surveillance    Vaccinations: Advised yearly influenza vaccine  Counseled on the Td (tetanus, diptheria) vaccine.  Vaccine information sheet given. Td vaccine given after consent obtained.  Shingles vaccine:  I recommend you have a shingles vaccine to help prevent shingles or herpes zoster outbreak.   Please call your insurer to inquire about coverage for the Shingrix vaccine given in 2 doses.   Some insurers cover this vaccine after age 72, some cover this after age 39.  If your insurer covers this, then call to schedule appointment to have this vaccine here.  He will get [place] a copy of his Covid vaccine call   Separate significant issues discussed:  Constipation - work on good water and fiber intake,  consider fiber supplement.    We will give you a sample of medication to try today, Linzess 4mcg, 1 capsule every other day to start  Contact Dr. Caroleen Hamman gastroenterology office for appointment if this isn't helping, and also inquire about next colonoscopy which likely would be 2022  High blood pressure-continue your current medication.  Try and cut down on meat products, consider more of a Mediterranean diet  Impaired glucose-we are checking diabetes screen today  High cholesterol-continue your current medication, limit foods made with butter margarine such as cakes and pies and cookies, limit meat intake  Low back pain-continue a regular exercise and stretching regimen  Work on losing weight through healthy diet and exercise   Ziaire was seen today for annual exam.  Diagnoses and all orders for this visit:  Routine general medical examination at a health care facility -     Comprehensive metabolic panel -     CBC with Differential/Platelet -     Lipid panel -     Hemoglobin A1c  Essential hypertension, benign -     Comprehensive metabolic panel  Impaired fasting blood sugar -     Comprehensive metabolic panel -     Hemoglobin A1c  Family history of heart disease  Elevated PSA  Vaccine counseling  Mixed hyperlipidemia -     Lipid panel  Other male erectile dysfunction  Class 1 obesity with serious comorbidity and body mass index (BMI) of 31.0 to 31.9 in adult, unspecified obesity type  Osteoarthritis of left knee, unspecified osteoarthritis type  Lumbar radiculopathy  Need for Td vaccine  Other orders -     Td : Tetanus/diphtheria >7yo Preservative  free -     linaclotide (LINZESS) 72 MCG capsule; Take 1 capsule (72 mcg total) by mouth daily before breakfast.    Follow-up pending labs, yearly for physical

## 2020-03-11 NOTE — Patient Instructions (Addendum)
Preventative Care for Adults - Male    Thank you for coming in for your well visit today, and thank you for trusting [place] with your care!  If you had a good experience today, please complete the surveys sent by [facility] and consider a review online such as Google or Health Grades, refer [place] to a friend   Maintain regular health and wellness exams:  A routine yearly physical is a good way to check in with your primary care provider about your health and preventive screening. It is also an opportunity to share updates about your health and any concerns you have, and receive a thorough all-over exam.   Most health insurance companies pay for at least some preventative services.  Check with your health plan for specific coverages.  What preventative services do men need?  Adult men should have their weight and blood pressure checked regularly.   Men age 54 and older should have their cholesterol levels checked regularly.  Beginning at age 35 and continuing to age 68, men should be screened for colorectal cancer.  Certain people may need continued testing until age 22.  Updating vaccinations is part of preventative care.  Vaccinations help protect against diseases such as the flu.  Osteoporosis is a disease in which the bones lose minerals and strength as we age. Men ages 30 and over should discuss this with their caregivers  Lab tests are generally done as part of preventative care to screen for anemia and blood disorders, to screen for problems with the kidneys and liver, to screen for bladder problems, to check blood sugar, and to check your cholesterol level.  Preventative services generally include counseling about diet, exercise, avoiding tobacco, drugs, excessive alcohol consumption, and sexually transmitted infections.   Xrays and CT scans are not normally done as a preventative test, and most insurances do not pay for imaging for screening other than as discussed under cancer screens  below.   On the other hand, if you have certain medical concerns, imaging may be necessary as a diagnostic test.    Your Medical Team Your medical team starts with [place], your PCP or primary care provider.  Please use our services for your routine care such as physicals, screenings, immunizations, sick visits, and your first stop for general medical concerns.  You can call our number for after hours information for urgent questions that may need attention but cannot wait til the next business day.    Urgent care-urgent cares exist to provide care when your primary care office would typically be closed such as evenings or weekends.   Urgent care is for evaluation of urgent medical problems that do not necessarily require emergency department care, but cannot wait til the next business day when we are open.  Emergency department care-please reserve emergency department care for serious, urgent, possibly life-threatening medical problems.  This includes issues like possible stroke, heart attack, significant injury, mental health crisis, or other urgent need that requires immediate medical attention.     See your dentist office twice yearly for hygiene and cleaning visits.   Brush your teeth and floss your teeth daily.  See your eye doctor yearly for routine eye exam and screenings for glaucoma and retinal disease.   Current Health Care Team:  Dentist  Eye doctor, none currently  Dr. Dorris Carnes, cardiology  Dr. Daylene Katayama, podiatry  Dr. Scarlette Shorts, Gastroenterology  Demir Titsworth, Camelia Eng, PA-C here for primary care    Specific Concerns:  Medical  Problems: Constipation - work on good water and fiber intake, consider fiber supplement.    We will give you a sample of medication to try today, Linzess 18mcg, 1 capsule every other day to start  Contact Dr. Caroleen Hamman gastroenterology office for appointment if this isn't helping, and also inquire about next colonoscopy which likely would be  2022  High blood pressure-continue your current medication.  Try and cut down on meat products, consider more of a Mediterranean diet  Impaired glucose-we are checking diabetes screen today  High cholesterol-continue your current medication, limit foods made with butter margarine such as cakes and pies and cookies, limit meat intake  Low back pain-continue a regular exercise and stretching regimen  Work on losing weight through healthy diet and exercise   EYE DOCTOR RECOMMENDATIONS  [place], [place], [place] [address] Phone: [phone] [email or web address]  [facility] Dr. Camillo Flaming [address] [place], [place] [address]  [email or web address]  Dr. Webb Laws [address] [place], [place] [address] [phone]    Vaccines:  Stay up to date with your tetanus shots and other required immunizations. You should have a booster for tetanus every 10 years. Be sure to get your flu shot every year, since 5%-20% of the [place] population comes down with the flu. The flu vaccine changes each year, so being vaccinated once is not enough. Get your shot in the fall, before the flu season peaks.   Other vaccines to consider:  Pneumococcal vaccine to protect against certain types of pneumonia.  This is normally recommended for adults age 80 or older.  However, adults younger than 60 years old with certain underlying conditions such as diabetes, heart or lung disease should also receive the vaccine.  Shingles vaccine to protect against Varicella Zoster if you are older than age 43, or younger than 60 years old with certain underlying illness.  If you have not had the Shingrix vaccine, please call your insurer to inquire about coverage for the Shingrix vaccine given in 2 doses.   Some insurers cover this vaccine after age 66, some cover this after age 41.  If your insurer covers this,  then call to schedule appointment to have this vaccine here  Hepatitis A vaccine to protect against a form of infection of the liver by a virus acquired from food.  Hepatitis B vaccine to protect against a form of infection of the liver by a virus acquired from blood or body fluids, particularly if you work in health care.  If you plan to travel internationally, check with your local health department for specific vaccination recommendations.  Human Papilloma Virus or HPV causes cancer of the cervix, and other infections that can be transmitted from person to person. There is a vaccine for HPV, and males should get immunized between the ages of 16 and 27. It requires a series of 3 shots.   Covid/Coronavirus - Please consider vaccination for your benefit and to help prevent spread of Covid to those around you.      Are your vaccines up to date: Is your Pneumococcal 13 vaccine up to date: due age 89 yo Is your Pneumococcal 23 vaccine up to date: yes. Is your Shingles vaccine up to date: no.   Is your Td/Tdap vaccine up to date: no. Is your Flu vaccine up to date: up coming in September.   Is your Covid vaccine up to date: yes.  Please get  [place] a copy of your card to [place]     What should I know about Cancer screening? Many types of cancers can be detected early and may often be prevented. Lung Cancer  You should be screened every year for lung cancer if: ? You are a current smoker who has smoked for at least 30 years. ? You are a former smoker who has quit within the past 15 years.  Talk to your health care provider about your screening options, when you should start screening, and how often you should be screened.  Colorectal Cancer  Routine colorectal cancer screening usually begins at 60 years of age and should be repeated every 5-10 years until you are 60 years old. You may need to be screened more often if early forms of precancerous polyps or small growths are found. Your health  care provider may recommend screening at an earlier age if you have risk factors for colon cancer.  Your health care provider may recommend using home test kits to check for hidden blood in the stool.  A small camera at the end of a tube can be used to examine your colon (sigmoidoscopy or colonoscopy). This checks for the earliest forms of colorectal cancer.  Prostate and Testicular Cancer  Depending on your age and overall health, your health care provider may do certain tests to screen for prostate and testicular cancer.  Talk to your health care provider about any symptoms or concerns you have about testicular or prostate cancer.  Skin Cancer  Check your skin from head to toe regularly.  Tell your health care provider about any new moles or changes in moles, especially if: ? There is a change in a mole's size, shape, or color. ? You have a mole that is larger than a pencil eraser.  Always use sunscreen. Apply sunscreen liberally and repeat throughout the day.  Protect yourself by wearing long sleeves, pants, a wide-brimmed hat, and sunglasses when outside.   Are you up to date on cancer screenings:  COLON CANCER SCREENING:  Due next year 2022  PROSTATE CANCER SCREENING:  You are seeing [facility] for elevated PSA now     [place]:  Healthy diet:  Eat a variety of foods, including fruit, vegetables, animal or vegetable protein, such as meat, fish, chicken, and eggs, or beans, lentils, tofu, and grains, such as rice.  Drink plenty of water daily.  Decrease saturated fat in the diet, avoid lots of red meat, processed foods, sweets, fast foods, and fried foods.  Exercise:  Aerobic exercise helps maintain good heart health. At least 30-40 minutes of moderate-intensity exercise is recommended. For example, a brisk walk that increases your heart rate and breathing. This should be done on most days of the week.   Find a type of exercise or a  variety of exercises that you enjoy so that it becomes a part of your daily life.  Examples are running, walking, swimming, water aerobics, and biking.  For motivation and support, explore group exercise such as aerobic class, spin class, Zumba, Yoga,or  martial arts, etc.    Set exercise goals for yourself, such as a certain weight goal, walk or run in a race such as a 5k walk/run.  Speak to your primary care provider about exercise goals.  Your weight readings per our records: Wt Readings from Last 3 Encounters:  03/11/20 (!) 239 lb (108.4 kg)  01/20/20 238 lb 12.8 oz (108.3 kg)  01/06/20 248  lb (112.5 kg)    Body mass index is 33.33 kg/m.   Health Goals:  "lose my gut"   Wants to combine regular exercise and eating healthier  Consistency with exercise   Exercise  My current exercise reported today is: 3 or more days per week with cardio/treadmill 15-20 minutes Circuit training several days per week   Eating habits My current eating habits include: Tries to eat variety of things, not a lot of fried foods  Goals for eating habits:      Disease prevention:  If you smoke or chew tobacco, find out from your caregiver how to quit. It can literally save your life, no matter how long you have been a tobacco user. If you do not use tobacco, never begin.   Maintain a healthy diet and normal weight. Increased weight leads to problems with blood pressure and diabetes.   The Body Mass Index or BMI is a way of measuring how much of your body is fat. Having a BMI above 27 increases the risk of heart disease, diabetes, hypertension, stroke and other problems related to obesity. Your caregiver can help determine your BMI and based on it develop an exercise and dietary program to help you achieve or maintain this important measurement at a healthful level.  High blood pressure causes heart and blood vessel problems.  Persistent high blood pressure should be treated with medicine if  weight loss and exercise do not work.  Your blood pressure readings per our records:     BP Readings from Last 3 Encounters:  03/11/20 (!) 132/84  01/20/20 (!) 142/86  02/21/19 130/72     Fat and cholesterol leaves deposits in your arteries that can block them. This causes heart disease and vessel disease elsewhere in your body.  If your cholesterol is found to be high, or if you have heart disease or certain other medical conditions, then you may need to have your cholesterol monitored frequently and be treated with medication.   Ask if you should have a cardiac stress test if your history suggests this. A stress test is a test done on a treadmill that looks for heart disease. This test can find disease prior to there being a problem.    Heart disease screening:  EKG 2019, cardiology consult with Dr. Harrington Challenger at that time   Osteoporosis is a disease in which the bones lose minerals and strength as we age. This can result in serious bone fractures. Risk of osteoporosis can be identified using a bone density scan. Men ages 47 and over should discuss this with their caregivers. Ask your caregiver whether you should be taking a calcium supplement and Vitamin D, to reduce the rate of osteoporosis.   Avoid drinking alcohol in excess (more than two drinks per day).  Avoid use of street drugs. Do not share needles with anyone. Ask for professional help if you need assistance or instructions on stopping the use of alcohol, cigarettes, and/or drugs.  Brush your teeth twice a day with fluoride toothpaste, and floss once a day. Good oral hygiene prevents tooth decay and gum disease. The problems can be painful, unattractive, and can cause other health problems. Visit your dentist for a routine oral and dental check up and preventive care every 6-12 months.      Spiritual and Emotional Health Keeping a healthy spiritual life can help you better manage your physical health. Your spiritual life can help  you to cope with any issues that may arise  with your physical health.  Balance can keep [place] healthy and help [place] to recover.  If you are struggling with your spiritual health there are questions that you may want to ask yourself:  What makes me feel most complete? When do I feel most connected to the rest of the world? Where do I find the most inner strength? What am I doing when I feel whole?  Helpful tips: . Being in nature. Some people feel very connected and at peace when they are walking outdoors or are outside. Marland Kitchen Helping others. Some feel the largest sense of wellbeing when they are of service to others. Being of service can take on many forms. It can be doing volunteer work, being kind to strangers, or offering a hand to a friend in need. . Gratitude. Some people find they feel the most connected when they remain grateful. They may make lists of all the things they are grateful for or say a thank you out loud for all they have.    Emotional Health Are you in tune with your emotional health?  Check out this link: http://www.bray.com/    Legal  Take the time to do a last will and testament, Advanced Directives including [place] and Living Will documents.  Don't leave your family with burdens that can be handled ahead of time.   Financial Health . Make sure you use a budget for your personal finances . Make sure you are insured against risks (health insurance, life insurance, auto insurance, etc) . Save more, spend less . Set financial goals . If you need help in this area, good resources include counseling through [employer] or other community resources, have a meeting with a Emergency planning/management officer, and a good resource is [employer] 10 reasons people come to the doctor's office:   (what is your "ounce of prevention")  Skin disorders; Osteoarthritis and joint disorders; Back problems; Cholesterol problems; Upper  respiratory conditions, excluding asthma; Anxiety, depression, and bipolar disorder; Chronic neurologic disorders; High blood pressure; Headaches and migraines; and Diabetes.      Safety:  Use seatbelts 100% of the time, whether driving or as a passenger.  Use safety devices such as hearing protection if you work in environments with loud noise or significant background noise.  Use safety glasses when doing any work that could send debris in to the eyes.  Use a helmet if you ride a bike or motorcycle.  Use appropriate safety gear for contact sports.  Talk to your caregiver about gun safety.  Use sunscreen with a SPF (or skin protection factor) of 15 or greater.  Lighter skinned people are at a greater risk of skin cancer. Don't forget to also wear sunglasses in order to protect your eyes from too much damaging sunlight. Damaging sunlight can accelerate cataract formation.   Keep carbon monoxide and smoke detectors in your home functioning at all times. Change the batteries every 6 months or use a model that plugs into the wall.    Sexual activity: . Sex is a normal part of life and sexual activity can continue into older adulthood for many healthy people.   . If you are having erectile dysfunction issues, please follow up to discuss this further.   . If you are not in a monogamous relationship or have more than one partner, please practice safe sex.  Use condoms. Condoms are used for birth control and to help reduce the spread of  sexually transmitted infections (or STIs).  Some of the STIs are gonorrhea (the clap), chlamydia, syphilis, trichomonas, herpes, HPV (human papilloma virus) and HIV (human immunodeficiency virus) which causes AIDS. The herpes, HIV and HPV are viral illnesses that have no cure. These can result in disability, cancer and death.   We are able to test for STIs here at our office.

## 2020-03-12 LAB — LIPID PANEL
Chol/HDL Ratio: 3.4 ratio (ref 0.0–5.0)
Cholesterol, Total: 157 mg/dL (ref 100–199)
HDL: 46 mg/dL (ref >39)
LDL Chol Calc (NIH): 88 mg/dL (ref 0–99)
Triglycerides: 127 mg/dL (ref 0–149)
VLDL Cholesterol Cal: 23 mg/dL (ref 5–40)

## 2020-03-12 LAB — CBC WITH DIFFERENTIAL/PLATELET
Basophils Absolute: 0 10*3/uL (ref 0.0–0.2)
Basos: 1 %
EOS (ABSOLUTE): 0.1 10*3/uL (ref 0.0–0.4)
Eos: 1 %
Hematocrit: 45.2 % (ref 37.5–51.0)
Hemoglobin: 15.3 g/dL (ref 13.0–17.7)
Immature Grans (Abs): 0 10*3/uL (ref 0.0–0.1)
Immature Granulocytes: 0 %
Lymphocytes Absolute: 2 10*3/uL (ref 0.7–3.1)
Lymphs: 30 %
MCH: 30.4 pg (ref 26.6–33.0)
MCHC: 33.8 g/dL (ref 31.5–35.7)
MCV: 90 fL (ref 79–97)
Monocytes Absolute: 0.9 10*3/uL (ref 0.1–0.9)
Monocytes: 13 %
Neutrophils Absolute: 3.7 10*3/uL (ref 1.4–7.0)
Neutrophils: 55 %
Platelets: 238 10*3/uL (ref 150–450)
RBC: 5.04 x10E6/uL (ref 4.14–5.80)
RDW: 13.6 % (ref 11.6–15.4)
WBC: 6.6 10*3/uL (ref 3.4–10.8)

## 2020-03-12 LAB — COMPREHENSIVE METABOLIC PANEL
ALT: 22 IU/L (ref 0–44)
AST: 21 IU/L (ref 0–40)
Albumin/Globulin Ratio: 1.7 (ref 1.2–2.2)
Albumin: 4.4 g/dL (ref 3.8–4.9)
Alkaline Phosphatase: 66 IU/L (ref 48–121)
BUN/Creatinine Ratio: 11 (ref 10–24)
BUN: 10 mg/dL (ref 8–27)
Bilirubin Total: 0.5 mg/dL (ref 0.0–1.2)
CO2: 22 mmol/L (ref 20–29)
Calcium: 9.7 mg/dL (ref 8.6–10.2)
Chloride: 102 mmol/L (ref 96–106)
Creatinine, Ser: 0.89 mg/dL (ref 0.76–1.27)
GFR calc Af Amer: 107 mL/min/{1.73_m2} (ref >59)
GFR calc non Af Amer: 93 mL/min/{1.73_m2} (ref >59)
Globulin, Total: 2.6 g/dL (ref 1.5–4.5)
Glucose: 85 mg/dL (ref 65–99)
Potassium: 3.6 mmol/L (ref 3.5–5.2)
Sodium: 144 mmol/L (ref 134–144)
Total Protein: 7 g/dL (ref 6.0–8.5)

## 2020-03-12 LAB — HEMOGLOBIN A1C
Est. average glucose Bld gHb Est-mCnc: 123 mg/dL
Hgb A1c MFr Bld: 5.9 % — ABNORMAL HIGH (ref 4.8–5.6)

## 2020-03-16 ENCOUNTER — Other Ambulatory Visit: Payer: Self-pay | Admitting: Urology

## 2020-03-16 DIAGNOSIS — R972 Elevated prostate specific antigen [PSA]: Secondary | ICD-10-CM

## 2020-03-29 ENCOUNTER — Ambulatory Visit
Admission: RE | Admit: 2020-03-29 | Discharge: 2020-03-29 | Disposition: A | Discharge status: Home / Self Care | Payer: BC Managed Care – PPO | Source: Ambulatory Visit | Attending: Urology | Admitting: Urology

## 2020-03-29 DIAGNOSIS — R972 Elevated prostate specific antigen [PSA]: Secondary | ICD-10-CM

## 2020-04-16 ENCOUNTER — Ambulatory Visit: Payer: BC Managed Care – PPO | Admitting: Nurse Practitioner

## 2020-04-16 ENCOUNTER — Other Ambulatory Visit (INDEPENDENT_AMBULATORY_CARE_PROVIDER_SITE_OTHER): Payer: BC Managed Care – PPO

## 2020-04-16 ENCOUNTER — Encounter: Payer: Self-pay | Admitting: Nurse Practitioner

## 2020-04-16 VITALS — BP 124/72 | HR 75 | Ht 71.0 in | Wt 236.2 lb

## 2020-04-16 DIAGNOSIS — K5900 Constipation, unspecified: Secondary | ICD-10-CM

## 2020-04-16 DIAGNOSIS — R198 Other specified symptoms and signs involving the digestive system and abdomen: Secondary | ICD-10-CM

## 2020-04-16 LAB — TSH: TSH: 2.59 u[IU]/mL (ref 0.35–4.50)

## 2020-04-16 LAB — IGA: IgA: 288 mg/dL (ref 68–378)

## 2020-04-16 MED ORDER — SUTAB 1479-225-188 MG PO TABS: 1.0000 | ORAL_TABLET | Freq: Once | ORAL | 0 refills | Status: AC

## 2020-04-16 NOTE — Patient Instructions (Addendum)
Your provider has requested that you go to the basement level for lab work before leaving today. Press "B" on the elevator. The lab is located at the first door on the left as you exit the elevator. ______________________________________________  James Bird have been scheduled for a colonoscopy. Please follow written instructions given to you at your visit today.  Please pick up your prep supplies at the pharmacy within the next 1-3 days. If you use inhalers (even only as needed), please bring them with you on the day of your procedure. Your physician has requested that you go to www.startemmi.com and enter the access code given to you at your visit today. This web site gives a general overview about your procedure. However, you should still follow specific instructions given to you by our office regarding your preparation for the procedure. _____________________________________________ Please purchase the following medications over the counter and take as directed: Benefiber 1 tablespoon daily (if tolerated) Miralax 1 capful (17 grams) dissolved in at least 8 ounces water/juice once every night as needed for constipation ______________________________________________ Take Miralax every night x 1 week prior to colonoscopy prep date.  ______________________________________________ Call our office should your symptoms worsen.  ______________________________________________ If you are age 60 or older, your body mass index should be between 23-30. Your Body mass index is 32.94 kg/m. If this is out of the aforementioned range listed, please consider follow up with your Primary Care Provider.  If you are age 60 or younger, your body mass index should be between 19-25. Your Body mass index is 32.94 kg/m. If this is out of the aformentioned range listed, please consider follow up with your Primary Care Provider.  ______________________________________________  Due to recent changes in healthcare laws, you  may see the results of your imaging and laboratory studies on MyChart before your provider has had a chance to review them.  We understand that in some cases there may be results that are confusing or concerning to you. Not all laboratory results come back in the same time frame and the provider may be waiting for multiple results in order to interpret others.  Please give [place] 48 hours in order for your provider to thoroughly review all the results before contacting the office for clarification of your results.   Thank you for choosing [facility] Noralyn Pick, CRNP

## 2020-04-16 NOTE — Progress Notes (Signed)
Current assessment and plan as noted

## 2020-04-16 NOTE — Progress Notes (Signed)
04/16/2020 James Bird [id number] Apr 20, 1961   CHIEF COMPLAINT: change in bowel pattern, constipation   HISTORY OF PRESENT ILLNESS: James Bird is a 59 year old male with a past medical history of hypertension, hyperlipidemia and diverticulosis.  He presents to our office today as referred by Chana Bode PA-C for further evaluation regarding constipation.  He developed constipation 6 to 12 months ago.  He goes 3 to 4 days without passing a bowel movement.  He took samples of Linzess 72 mcg one capsule for a few days without improvement.  He denies having any upper or lower abdominal pain.  No unusual abdominal bloat.  He continues to strain to pass a bowel movement every 3 to 4 days.  A few days ago he strained to pass a hard bowel movement then went back to the bathroom 15 to 30 minutes later and passed several loose stools.  No rectal bleeding or melena.  No further loose stools.  He takes Tadalafil 4 to 5 times monthly and he questions if this medication contributes to his constipation.  No fever, sweats or chills.  No weight loss.  He underwent a screening colonoscopy by Dr. Henrene Pastor 10/20/2010 which identified sigmoid diverticulosis.  At that time, he was advised to repeat screening colonoscopy in 10 years.  No family history of colon polyps or colorectal cancer.  He underwent a prostate biopsy approximately 1 year ago which he reported was negative. He infrequently takes Mobic for back pain or other aches and pains. No other complaints today.   CBC Latest Ref Rng & Units 03/11/2020 04/24/2018 03/29/2017  WBC 3.4 - 10.8 x10E3/uL 6.6 7.0 9.1  Hemoglobin 13.0 - 17.7 g/dL 15.3 15.6 16.0  Hematocrit 37.5 - 51.0 % 45.2 46.7 45.8  Platelets 150 - 450 x10E3/uL 238 268 253    CMP Latest Ref Rng & Units 03/11/2020 04/24/2018 03/29/2017  Glucose 65 - 99 mg/dL 85 90 89  BUN 8 - 27 mg/dL 10 15 17   Creatinine 0.76 - 1.27 mg/dL 0.89 0.84 0.87  Sodium 134 - 144 mmol/L 144 142 141  Potassium 3.5 - 5.2  mmol/L 3.6 4.3 3.9  Chloride 96 - 106 mmol/L 102 98 104  CO2 20 - 29 mmol/L 22 25 23   Calcium 8.6 - 10.2 mg/dL 9.7 10.0 9.7  Total Protein 6.0 - 8.5 g/dL 7.0 7.1 7.0  Total Bilirubin 0.0 - 1.2 mg/dL 0.5 0.6 0.9  Alkaline Phos 48 - 121 IU/L 66 63 61  AST 0 - 40 IU/L 21 25 16   ALT 0 - 44 IU/L 22 27 16     Past Medical History:  Diagnosis Date  . Allergy    RHINITS  . Chronic shoulder pain    left  . Constipation 2019  . Diverticulosis   . ED (erectile dysfunction)   . Former smoker    quit late 2018  . H/O echocardiogram 03/20/08   mild - moderate LVH, EF 50-55%, mild RV enlargement, mild mitral regurgitation, Dr. Verlon Setting  . 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 History of cardiovascular stress test 03/20/2008   dobutamine stress test, normal study, Dr. Verlon Setting  . History of neck surgery   . Hyperlipidemia   . Hypertension   . Impaired fasting glucose    Past Surgical History:  Procedure Laterality Date  . cdisc & fusion  2011   Dr. Arnoldo Morale  . COLONOSCOPY  10/2010   diverticulosis, othewrise normal, repeat 10 years,  Dr. Henrene Pastor  . SPINE SURGERY  2011(JENKINS,MD)   CERVICAL DISC AND FUSION   Social History:  He smoked 1ppd or less off and on x 20 yrs.  Quit smoking 2 years ago. He drinks burbon or beer 12 to 15 week. Past cocaine and marijuana use.   Family History: Mother died at the age 76 with history of lung cancer.  Father died age 43 MI. Paternal aunt and paternal uncle had cancer, detail unknown.   No Known Allergies    Outpatient Encounter Medications as of 04/16/2020  Medication Sig  . atorvastatin (LIPITOR) 20 MG tablet TAKE 1 TABLET(20 MG) BY MOUTH DAILY AT 6 PM  . chlorhexidine (PERIDEX) 0.12 % solution 15 mLs 2 (two) times daily.  Marland Kitchen linaclotide (LINZESS) 72 MCG capsule Take 1 capsule (72 mcg total) by mouth daily before breakfast.  . lisinopril-hydrochlorothiazide (ZESTORETIC) 20-12.5 MG tablet TAKE 1 TABLET BY MOUTH EVERY DAY  . meloxicam (MOBIC) 15 MG  tablet TAKE 1 TABLET(15 MG) BY MOUTH DAILY  . predniSONE (STERAPRED UNI-PAK 21 TAB) 10 MG (21) TBPK tablet Take by mouth daily. Tapering dose as recommended. (Patient not taking: Reported on 01/20/2020)  . tadalafil (CIALIS) 20 MG tablet Take 1 tablet (20 mg total) by mouth daily as needed for erectile dysfunction. (Patient not taking: Reported on 01/02/2019)  . terbinafine (LAMISIL) 250 MG tablet Take 1 tablet (250 mg total) by mouth daily. (Patient not taking: Reported on 09/13/2018)  . triamcinolone cream (KENALOG) 0.1 % Apply 1 application topically 2 (two) times daily. (Patient not taking: Reported on 01/20/2020)  . varenicline (CHANTIX STARTING MONTH PAK) 0.5 MG X 11 & 1 MG X 42 tablet Take one 0.5 mg tablet by mouth once daily for 3 days, then increase to one 0.5 mg tablet twice daily for 4 days, then increase to one 1 mg tablet twice daily. (Patient not taking: Reported on 01/06/2020)   No facility-administered encounter medications on file as of 04/16/2020.     REVIEW OF SYSTEMS:  Gen: Denies fever, sweats or chills. No weight loss.  CV: Denies chest pain, palpitations or edema. Resp: Denies cough, shortness of breath of hemoptysis.  GI: See HPI.  GU : Denies urinary burning, blood in urine, increased urinary frequency or incontinence. MS: Denies joint pain, muscles aches or weakness. Derm: Denies rash, itchiness, skin lesions or unhealing ulcers. Psych: Denies depression, anxiety or memory loss.  Heme: Denies bruising, bleeding. Neuro:  Denies headaches, dizziness or paresthesias. Endo:  Denies any problems with DM, thyroid or adrenal function.    PHYSICAL EXAM: BP 124/72   Pulse 75   Ht 5\' 11"  (1.803 m)   Wt 236 lb 3.2 oz (107.1 kg)   SpO2 95%   BMI 32.94 kg/m   General: Well developed 59 year old male in no acute distress. Head: Normocephalic and atraumatic. Eyes:  Sclerae non-icteric, conjunctive pink. Ears: Normal auditory acuity. Mouth: Dentition intact. No ulcers or  lesions.  Neck: Supple, no lymphadenopathy or thyromegaly.  Lungs: Clear bilaterally to auscultation without wheezes, crackles or rhonchi. Heart: Regular rate and rhythm. No murmur, rub or gallop appreciated.  Abdomen: Soft, nontender, non distended. No masses. No hepatosplenomegaly. Normoactive bowel sounds x 4 quadrants. Umbilical hernia.  Rectal: Deferred.  Musculoskeletal: Symmetrical with no gross deformities. Skin: Warm and dry. No rash or lesions on visible extremities. Extremities: No edema. Neurological: Alert oriented x 4, no focal deficits.  Psychological:  Alert and cooperative. Normal mood and affect.  ASSESSMENT AND PLAN:  34. 59 year old male with a change in bowel pattern. Constipation for the past 6 to 12 months. Tadalafil (Cialis) unlikely causing constipation.  -Benefiber 1 tbsp daily. Miralax Q HS. If no improvement consider Linzess 169mcg po QD -Diagnostic colonoscopy benefits and risks discussed including risk with sedation, risk of bleeding, perforation and infection  -TSH, tTg and IgA -Patient to call our office if symptoms worsen  2. HTN, well controlled    CC:  Denita Lung, MD

## 2020-04-17 ENCOUNTER — Telehealth: Payer: Self-pay | Admitting: General Surgery

## 2020-04-17 LAB — TISSUE TRANSGLUTAMINASE, IGA: (tTG) Ab, IgA: <1 U/mL

## 2020-04-17 NOTE — Telephone Encounter (Signed)
-----   Message from Noralyn Pick, NP sent at 04/17/2020  6:01 AM EDT ----- Olivia Mackie, pls inform the patient his thyroid test (TSH) was normal. Thx

## 2020-04-17 NOTE — Telephone Encounter (Signed)
Contacted the patient and advised him that his Thyroid test was normal. The patient verbalized understanding.

## 2020-04-19 ENCOUNTER — Inpatient Hospital Stay: Admission: RE | Admit: 2020-04-19 | Payer: BC Managed Care – PPO | Source: Ambulatory Visit

## 2020-05-04 ENCOUNTER — Encounter: Payer: Self-pay | Admitting: Internal Medicine

## 2020-05-09 ENCOUNTER — Ambulatory Visit
Admission: RE | Admit: 2020-05-09 | Discharge: 2020-05-09 | Disposition: A | Discharge status: Home / Self Care | Payer: BC Managed Care – PPO | Source: Ambulatory Visit | Attending: Urology | Admitting: Urology

## 2020-05-09 MED ORDER — GADOBENATE DIMEGLUMINE 529 MG/ML IV SOLN
20.0000 mL | Freq: Once | INTRAVENOUS | Status: AC | PRN
  Administered 2020-05-09: 20 mL via INTRAVENOUS

## 2020-05-15 ENCOUNTER — Encounter: Payer: Self-pay | Admitting: Internal Medicine

## 2020-05-15 ENCOUNTER — Ambulatory Visit (AMBULATORY_SURGERY_CENTER): Payer: BC Managed Care – PPO | Admitting: Internal Medicine

## 2020-05-15 ENCOUNTER — Other Ambulatory Visit: Payer: Self-pay

## 2020-05-15 VITALS — BP 122/68 | HR 69 | Temp 98.2°F | Resp 17 | Ht 71.0 in | Wt 236.0 lb

## 2020-05-15 DIAGNOSIS — K5730 Diverticulosis of large intestine without perforation or abscess without bleeding: Secondary | ICD-10-CM

## 2020-05-15 DIAGNOSIS — K648 Other hemorrhoids: Secondary | ICD-10-CM | POA: Diagnosis not present

## 2020-05-15 DIAGNOSIS — D122 Benign neoplasm of ascending colon: Secondary | ICD-10-CM

## 2020-05-15 DIAGNOSIS — K5900 Constipation, unspecified: Secondary | ICD-10-CM

## 2020-05-15 DIAGNOSIS — R194 Change in bowel habit: Secondary | ICD-10-CM

## 2020-05-15 DIAGNOSIS — Z1211 Encounter for screening for malignant neoplasm of colon: Secondary | ICD-10-CM

## 2020-05-15 DIAGNOSIS — R198 Other specified symptoms and signs involving the digestive system and abdomen: Secondary | ICD-10-CM

## 2020-05-15 MED ORDER — SODIUM CHLORIDE 0.9 % IV SOLN: 500.0000 mL | Freq: Once | INTRAVENOUS | Status: DC

## 2020-05-15 NOTE — Op Note (Signed)
[facility] Patient Name: James Bird Procedure Date: 05/15/2020 9:42 AM MRN: [id number] Endoscopist: Docia Chuck. Henrene Pastor , MD Age: 59 Referring MD:  Date of Birth: [birth year] Gender: Male Account #: [id number] Procedure:                Colonoscopy with cold snare polypectomy x 2 Indications:              Colon cancer screening. Incidental complaints of                            change in bowel habits, Constipation. Previous                            examination about 10 years ago was negative for                            neoplasia Medicines:                Monitored Anesthesia Care Procedure:                Pre-Anesthesia Assessment:                           - Prior to the procedure, a History and Physical                            was performed, and patient medications and                            allergies were reviewed. The patient's tolerance of                            previous anesthesia was also reviewed. The risks                            and benefits of the procedure and the sedation                            options and risks were discussed with the patient.                            All questions were answered, and informed consent                            was obtained. Prior Anticoagulants: The patient has                            taken no previous anticoagulant or antiplatelet                            agents. After reviewing the risks and benefits, the                            patient was deemed in satisfactory condition to  undergo the procedure.                           After obtaining informed consent, the colonoscope                            was passed under direct vision. Throughout the                            procedure, the patient's blood pressure, pulse, and                            oxygen saturations were monitored continuously. The                            Colonoscope was introduced through the  anus and                            advanced to the the cecum, identified by                            appendiceal orifice and ileocecal valve. The                            ileocecal valve, appendiceal orifice, and rectum                            were photographed. The quality of the bowel                            preparation was excellent. The colonoscopy was                            performed without difficulty. The patient tolerated                            the procedure well. The bowel preparation used was                            SUPREP via split dose instruction. Scope In: 9:52:14 AM Scope Out: 10:05:58 AM Scope Withdrawal Time: 0 hours 11 minutes 3 seconds  Total Procedure Duration: 0 hours 13 minutes 44 seconds  Findings:                 Two polyps were found in the ascending colon. The                            polyps were 1 to 2 mm in size. These polyps were                            removed with a cold snare. Resection and retrieval                            were complete.  A few small-mouthed diverticula were found in the                            sigmoid colon.                           Internal hemorrhoids were found during                            retroflexion. The hemorrhoids were small.                           The exam was otherwise without abnormality on                            direct and retroflexion views. Complications:            No immediate complications. Estimated blood loss:                            None. Estimated Blood Loss:     Estimated blood loss: none. Impression:               - Two 1 to 2 mm polyps in the ascending colon,                            removed with a cold snare. Resected and retrieved.                           - Diverticulosis in the sigmoid colon.                           - Internal hemorrhoids.                           - The examination was otherwise normal on direct                             and retroflexion views. Recommendation:           - Repeat colonoscopy in 7-10 years for surveillance.                           - Patient has a contact number available for                            emergencies. The signs and symptoms of potential                            delayed complications were discussed with the                            patient. Return to normal activities tomorrow.                            Written discharge instructions were provided to the  patient.                           - Resume previous diet.                           - Continue present medications.                           - Await pathology results. Docia Chuck. Henrene Pastor, MD 05/15/2020 10:11:18 AM This report has been signed electronically.

## 2020-05-15 NOTE — Progress Notes (Signed)
Report to PACU, RN, vss, BBS= Clear.  

## 2020-05-15 NOTE — Patient Instructions (Signed)
Please read all of the handouts given to you by your recovery room nurse.  Thank-you for choosing [place] for your healthcare needs today.  YOU HAD AN ENDOSCOPIC PROCEDURE TODAY AT [facility]:   Refer to the procedure report that was given to you for any specific questions about what was found during the examination.  If the procedure report does not answer your questions, please call your gastroenterologist to clarify.  If you requested that your care partner not be given the details of your procedure findings, then the procedure report has been included in a sealed envelope for you to review at your convenience later.  YOU SHOULD EXPECT: Some feelings of bloating in the abdomen. Passage of more gas than usual.  Walking can help get rid of the air that was put into your GI tract during the procedure and reduce the bloating. If you had a lower endoscopy (such as a colonoscopy or flexible sigmoidoscopy) you may notice spotting of blood in your stool or on the toilet paper. If you underwent a bowel prep for your procedure, you may not have a normal bowel movement for a few days.  Please Note:  You might notice some irritation and congestion in your nose or some drainage.  This is from the oxygen used during your procedure.  There is no need for concern and it should clear up in a day or so.  SYMPTOMS TO REPORT IMMEDIATELY:   Following lower endoscopy (colonoscopy or flexible sigmoidoscopy):  Excessive amounts of blood in the stool  Significant tenderness or worsening of abdominal pains  Swelling of the abdomen that is new, acute  Fever of 100F or higher   For urgent or emergent issues, a gastroenterologist can be reached at any hour by calling [phone]. Do not use MyChart messaging for urgent concerns.    DIET:  We do recommend a small meal at first, but then you may proceed to your regular diet.  Drink plenty of fluids but you should avoid alcoholic beverages for 24  hours. Try to increase the fiber in your diet, and drink plenty of water.  ACTIVITY:  You should plan to take it easy for the rest of today and you should NOT DRIVE or use heavy machinery until tomorrow (because of the sedation medicines used during the test).    FOLLOW UP: Our staff will call the number listed on your records 48-72 hours following your procedure to check on you and address any questions or concerns that you may have regarding the information given to you following your procedure. If we do not reach you, we will leave a message.  We will attempt to reach you two times.  During this call, we will ask if you have developed any symptoms of COVID 19. If you develop any symptoms (ie: fever, flu-like symptoms, shortness of breath, cough etc.) before then, please call [phone].  If you test positive for Covid 19 in the 2 weeks post procedure, please call and report this information to [place].    If any biopsies were taken you will be contacted by phone or by letter within the next 1-3 weeks.  Please call us at [phone] if you have not heard about the biopsies in 3 weeks.    SIGNATURES/CONFIDENTIALITY: You and/or your care partner have signed paperwork which will be entered into your electronic medical record.  These signatures attest to the fact that that the information above on your After Visit Summary has been  reviewed and is understood.  Full responsibility of the confidentiality of this discharge information lies with you and/or your care-partner. 

## 2020-05-15 NOTE — Progress Notes (Signed)
Called to room to assist during endoscopic procedure.  Patient ID and intended procedure confirmed with present staff. Received instructions for my participation in the procedure from the performing physician.  

## 2020-05-19 ENCOUNTER — Telehealth: Payer: Self-pay | Admitting: *Deleted

## 2020-05-19 NOTE — Telephone Encounter (Signed)
°  Follow up Call-  Call back number 05/15/2020  Post procedure Call Back phone  # [phone]  Permission to leave phone message Yes  Some recent data might be hidden     Patient questions:  Do you have a fever, pain , or abdominal swelling? No. Pain Score  0 *  Have you tolerated food without any problems? Yes.    Have you been able to return to your normal activities? Yes.    Do you have any questions about your discharge instructions: Diet   No. Medications  No. Follow up visit  No.  Do you have questions or concerns about your Care? No.  Actions: * If pain score is 4 or above: No action needed, pain <4  1. Have you developed a fever since your procedure? NO  2.   Have you had an respiratory symptoms (SOB or cough) since your procedure? NO  3.   Have you tested positive for COVID 19 since your procedure NO  4.   Have you had any family members/close contacts diagnosed with the COVID 19 since your procedure?  NO   If yes to any of these questions please route to Joylene John, RN and Joella Prince, RN

## 2020-05-20 ENCOUNTER — Encounter: Payer: Self-pay | Admitting: Internal Medicine

## 2020-06-03 ENCOUNTER — Other Ambulatory Visit: Payer: Self-pay | Admitting: Medical

## 2020-08-28 ENCOUNTER — Other Ambulatory Visit: Payer: Self-pay | Admitting: Medical

## 2020-08-28 ENCOUNTER — Other Ambulatory Visit: Payer: Self-pay | Admitting: Internal Medicine

## 2020-10-02 ENCOUNTER — Encounter: Payer: Self-pay | Admitting: Family Medicine

## 2020-10-02 ENCOUNTER — Ambulatory Visit: Payer: BC Managed Care – PPO | Admitting: Family Medicine

## 2020-10-02 ENCOUNTER — Other Ambulatory Visit: Payer: Self-pay

## 2020-10-02 VITALS — BP 124/82 | HR 86 | Temp 97.6°F | Wt 240.0 lb

## 2020-10-02 DIAGNOSIS — R202 Paresthesia of skin: Secondary | ICD-10-CM | POA: Diagnosis not present

## 2020-10-02 DIAGNOSIS — Z9889 Other specified postprocedural states: Secondary | ICD-10-CM | POA: Diagnosis not present

## 2020-10-02 DIAGNOSIS — R258 Other abnormal involuntary movements: Secondary | ICD-10-CM

## 2020-10-02 NOTE — Progress Notes (Signed)
   Subjective:    Patient ID: James Bird, male    DOB: [birth year], 60 y.o.   MRN: [id number]  HPI Several days and ago he noted a pins-and-needles sensation in the right posterior auricular area.  He states that since then he has noticed that it has decreased.  He seen no rash, had no pain, earache, sore throat, blurred or double vision. At the end of the encounter he then mentioned difficulty with clonus.  Apparently he has noticed this since his cervical surgery in 2011.  He now notes that his right leg feels weak describing it as a heavy sensation and he has a fear of falling but he has not fallen yet.   Review of Systems     Objective:   Physical Exam Alert and in no distress.  Exam of the posterior auricular area shows no rash or other lesions.  The ear is normal.  Ear canals normal.  The TM is slightly erythematous.  Left TM and canal normal.  Neck is supple without adenopathy. Exam of the lower extremities shows 3+ DTR on the right and 2+ on the left.  3 beat clonus is noted on he right.  Normal dorsiflexion and plantar flexion is noted. Medical record reviewed but the surgery was done in 2011 and records do not go that far back.  I assume it was a cervical disc type surgery.      Assessment & Plan:  S/P cervical disc replacement  Clonus  Pins and needles sensation I reassured him that the pins and needle sensation is probably of no great concern since it is going away. I do think it is reasonable to see Dr. Arnoldo Morale again since he is now having more difficulty with weakness.

## 2020-11-12 ENCOUNTER — Other Ambulatory Visit: Payer: Self-pay | Admitting: Neurosurgery

## 2020-11-12 DIAGNOSIS — R258 Other abnormal involuntary movements: Secondary | ICD-10-CM

## 2021-01-02 ENCOUNTER — Other Ambulatory Visit: Payer: Self-pay | Admitting: Family Medicine

## 2021-03-12 ENCOUNTER — Encounter: Payer: BC Managed Care – PPO | Admitting: Medical

## 2021-03-15 ENCOUNTER — Encounter: Payer: BC Managed Care – PPO | Admitting: Medical

## 2021-03-16 ENCOUNTER — Encounter: Payer: BC Managed Care – PPO | Admitting: Family Medicine

## 2021-04-06 ENCOUNTER — Other Ambulatory Visit: Payer: Self-pay | Admitting: Family Medicine

## 2021-05-04 ENCOUNTER — Other Ambulatory Visit: Payer: Self-pay

## 2021-05-04 ENCOUNTER — Encounter: Payer: Self-pay | Admitting: Family Medicine

## 2021-05-04 ENCOUNTER — Ambulatory Visit: Payer: BC Managed Care – PPO | Admitting: Family Medicine

## 2021-05-04 VITALS — BP 148/86 | HR 78 | Temp 98.1°F | Wt 236.8 lb

## 2021-05-04 DIAGNOSIS — L237 Allergic contact dermatitis due to plants, except food: Secondary | ICD-10-CM

## 2021-05-04 MED ORDER — PREDNISONE 10 MG (21) PO TBPK: ORAL_TABLET | ORAL | 0 refills | Status: DC

## 2021-05-04 NOTE — Patient Instructions (Signed)
Use cold compresses on it and Benadryl at night for the itching steroid cream as needed

## 2021-05-04 NOTE — Progress Notes (Signed)
   Subjective:    Patient ID: James Bird, male    DOB: [birth year], 60 y.o.   MRN: [id number]  HPI He did yard work this weekend and yesterday noted a rash occurring on the right forearm and to a lesser extent on the forehead.  He has had difficulty with this in the past and had required steroid pills.  He would like that again.  Review of Systems     Objective:   Physical Exam Alert and in no distress.  Exam of the right forearm does show very minimal confluent rash over the medial aspect of the forearm with a lesser extent on the forehead.       Assessment & Plan:   Contact dermatitis due to poison ivy - Plan: predniSONE (STERAPRED UNI-PAK 21 TAB) 10 MG (21) TBPK tablet Use cold compresses on it and Benadryl at night for the itching steroid cream as needed Since he has had difficulty with this in the past requiring this extent of the medication, I will do it again

## 2021-08-02 ENCOUNTER — Other Ambulatory Visit: Payer: Self-pay | Admitting: Family Medicine

## 2021-08-04 LAB — PSA: PSA: 5.33

## 2021-08-24 ENCOUNTER — Encounter: Payer: Self-pay | Admitting: Family Medicine

## 2021-08-24 ENCOUNTER — Ambulatory Visit: Payer: BC Managed Care – PPO | Admitting: Family Medicine

## 2021-08-24 ENCOUNTER — Other Ambulatory Visit: Payer: Self-pay

## 2021-08-24 VITALS — BP 116/70 | HR 82 | Temp 97.5°F | Ht 70.5 in | Wt 235.2 lb

## 2021-08-24 DIAGNOSIS — N528 Other male erectile dysfunction: Secondary | ICD-10-CM

## 2021-08-24 DIAGNOSIS — Z0000 Encounter for general adult medical examination without abnormal findings: Secondary | ICD-10-CM | POA: Diagnosis not present

## 2021-08-24 DIAGNOSIS — Z1159 Encounter for screening for other viral diseases: Secondary | ICD-10-CM

## 2021-08-24 DIAGNOSIS — Z23 Encounter for immunization: Secondary | ICD-10-CM | POA: Diagnosis not present

## 2021-08-24 DIAGNOSIS — E782 Mixed hyperlipidemia: Secondary | ICD-10-CM | POA: Diagnosis not present

## 2021-08-24 DIAGNOSIS — R7301 Impaired fasting glucose: Secondary | ICD-10-CM

## 2021-08-24 DIAGNOSIS — R5383 Other fatigue: Secondary | ICD-10-CM

## 2021-08-24 DIAGNOSIS — I10 Essential (primary) hypertension: Secondary | ICD-10-CM

## 2021-08-24 DIAGNOSIS — Z86010 Personal history of colonic polyps: Secondary | ICD-10-CM

## 2021-08-24 DIAGNOSIS — Z9889 Other specified postprocedural states: Secondary | ICD-10-CM

## 2021-08-24 DIAGNOSIS — R972 Elevated prostate specific antigen [PSA]: Secondary | ICD-10-CM

## 2021-08-24 DIAGNOSIS — Z87891 Personal history of nicotine dependence: Secondary | ICD-10-CM | POA: Insufficient documentation

## 2021-08-24 DIAGNOSIS — M1712 Unilateral primary osteoarthritis, left knee: Secondary | ICD-10-CM

## 2021-08-24 MED ORDER — SILDENAFIL CITRATE 100 MG PO TABS
100.0000 mg | ORAL_TABLET | Freq: Every day | ORAL | 5 refills | Status: DC | PRN
Start: 2021-08-24 — End: 2022-05-17

## 2021-08-24 MED ORDER — ATORVASTATIN CALCIUM 20 MG PO TABS: 20.0000 mg | ORAL_TABLET | Freq: Every day | ORAL | 3 refills | Status: DC

## 2021-08-24 MED ORDER — LISINOPRIL-HYDROCHLOROTHIAZIDE 20-12.5 MG PO TABS: 1.0000 | ORAL_TABLET | Freq: Every day | ORAL | 3 refills | Status: DC

## 2021-08-24 NOTE — Progress Notes (Signed)
° °  Subjective:    Patient ID: James Bird, male    DOB: 03/16/1961, 61 y.o.   MRN: [id number]  HPI He is here for complete examination.  He does have concerns over his smoking history.  He is a 15-pack-year smoker that quit approximately 5 years ago.  He also complains of decreased libido, energy and stamina.  He is concerned about his testosterone level.  He does have erectile dysfunction and would like a refill on his sildenafil.  He has a previous history of cervical disc disease with surgery and subsequent clonus..  He has an elevated PSA and has had urologic evaluation including MRI and biopsy.  This was negative and he is now in a yearly pattern.  His last PSA was 5.5.  Continues have difficulty with his left knee does have a previous x-ray done in 2016 which did show evidence of degenerative changes.  It is now interfering with his ADLs.  He does have a history of colonic polyps and is scheduled for routine follow-up on that.  He continues on Lipitor and is having no difficulty with that.  Is also taking lisinopril/HCTZ.  Otherwise his family and social history as well as health maintenance and immunizations was reviewed.   Review of Systems  All other systems reviewed and are negative.     Objective:   Physical Exam Alert and in no distress. Tympanic membranes and canals are normal. Pharyngeal area is normal. Neck is supple without adenopathy or thyromegaly. Cardiac exam shows a regular sinus rhythm without murmurs or gallops. Lungs are clear to auscultation.        Assessment & Plan:  Routine general medical examination at a health care facility  Fatigue, unspecified type - Plan: Testosterone, TSH  Essential hypertension, benign - Plan: CBC with Differential/Platelet, Comprehensive metabolic panel, lisinopril-hydrochlorothiazide (ZESTORETIC) 20-12.5 MG tablet  Impaired fasting blood sugar - Plan: Comprehensive metabolic panel  Mixed hyperlipidemia - Plan: Lipid panel,  atorvastatin (LIPITOR) 20 MG tablet  Other male erectile dysfunction - Plan: sildenafil (VIAGRA) 100 MG tablet  Osteoarthritis of left knee, unspecified osteoarthritis type  Elevated PSA  Need for shingles vaccine - Plan: Varicella-zoster vaccine IM  Need for hepatitis C screening test - Plan: Hepatitis C antibody  Former smoker  History of colonic polyps  H/O cervical discectomy Discussed the smoking history with him and he does not qualify for CT scanning for lung disease. Discussed knee pain with him in detail.  Recommend initially using Tylenol and then adding NSAID of choice.  X-rays were ordered.  If he continues have difficulty, he will return for possible injection. Continue on present medication regimen.  Follow-up with gastroenterology as well as urology.

## 2021-08-25 LAB — CBC WITH DIFFERENTIAL/PLATELET
Basophils Absolute: 0 10*3/uL (ref 0.0–0.2)
Basos: 0 %
EOS (ABSOLUTE): 0.1 10*3/uL (ref 0.0–0.4)
Eos: 1 %
Hematocrit: 47.5 % (ref 37.5–51.0)
Hemoglobin: 16.3 g/dL (ref 13.0–17.7)
Immature Grans (Abs): 0 10*3/uL (ref 0.0–0.1)
Immature Granulocytes: 0 %
Lymphocytes Absolute: 2.9 10*3/uL (ref 0.7–3.1)
Lymphs: 33 %
MCH: 30.1 pg (ref 26.6–33.0)
MCHC: 34.3 g/dL (ref 31.5–35.7)
MCV: 88 fL (ref 79–97)
Monocytes Absolute: 1.1 10*3/uL — ABNORMAL HIGH (ref 0.1–0.9)
Monocytes: 13 %
Neutrophils Absolute: 4.6 10*3/uL (ref 1.4–7.0)
Neutrophils: 53 %
Platelets: 269 10*3/uL (ref 150–450)
RBC: 5.41 x10E6/uL (ref 4.14–5.80)
RDW: 12.9 % (ref 11.6–15.4)
WBC: 8.7 10*3/uL (ref 3.4–10.8)

## 2021-08-25 LAB — COMPREHENSIVE METABOLIC PANEL
ALT: 23 IU/L (ref 0–44)
AST: 23 IU/L (ref 0–40)
Albumin/Globulin Ratio: 1.6 (ref 1.2–2.2)
Albumin: 4.6 g/dL (ref 3.8–4.8)
Alkaline Phosphatase: 70 IU/L (ref 44–121)
BUN/Creatinine Ratio: 16 (ref 10–24)
BUN: 16 mg/dL (ref 8–27)
Bilirubin Total: 0.5 mg/dL (ref 0.0–1.2)
CO2: 26 mmol/L (ref 20–29)
Calcium: 9.9 mg/dL (ref 8.6–10.2)
Chloride: 102 mmol/L (ref 96–106)
Creatinine, Ser: 1 mg/dL (ref 0.76–1.27)
Globulin, Total: 2.8 g/dL (ref 1.5–4.5)
Glucose: 93 mg/dL (ref 70–99)
Potassium: 4.1 mmol/L (ref 3.5–5.2)
Sodium: 142 mmol/L (ref 134–144)
Total Protein: 7.4 g/dL (ref 6.0–8.5)
eGFR: 86 mL/min/{1.73_m2} (ref >59)

## 2021-08-25 LAB — LIPID PANEL
Chol/HDL Ratio: 4.2 ratio (ref 0.0–5.0)
Cholesterol, Total: 165 mg/dL (ref 100–199)
HDL: 39 mg/dL — ABNORMAL LOW (ref >39)
LDL Chol Calc (NIH): 105 mg/dL — ABNORMAL HIGH (ref 0–99)
Triglycerides: 115 mg/dL (ref 0–149)
VLDL Cholesterol Cal: 21 mg/dL (ref 5–40)

## 2021-08-25 LAB — TESTOSTERONE: Testosterone: 585 ng/dL (ref 264–916)

## 2021-08-25 LAB — HEPATITIS C ANTIBODY: Hep C Virus Ab: <0.1 s/co ratio (ref 0.0–0.9)

## 2021-08-25 LAB — TSH: TSH: 1.34 u[IU]/mL (ref 0.450–4.500)

## 2021-08-30 ENCOUNTER — Telehealth: Payer: Self-pay

## 2021-08-30 NOTE — Telephone Encounter (Signed)
Pt will find out where the tadalafil will be coverd cheaper and go to [facility]. He will call to let us know. [place]

## 2021-10-27 ENCOUNTER — Other Ambulatory Visit: Payer: Self-pay

## 2021-10-27 ENCOUNTER — Other Ambulatory Visit (INDEPENDENT_AMBULATORY_CARE_PROVIDER_SITE_OTHER): Payer: BC Managed Care – PPO

## 2021-10-27 DIAGNOSIS — Z23 Encounter for immunization: Secondary | ICD-10-CM

## 2021-11-02 ENCOUNTER — Ambulatory Visit
Admission: RE | Admit: 2021-11-02 | Discharge: 2021-11-02 | Disposition: A | Discharge status: Home / Self Care | Payer: BC Managed Care – PPO | Source: Ambulatory Visit | Attending: Family Medicine | Admitting: Family Medicine

## 2021-11-02 ENCOUNTER — Other Ambulatory Visit: Payer: Self-pay

## 2021-11-02 ENCOUNTER — Ambulatory Visit: Payer: BC Managed Care – PPO | Admitting: Family Medicine

## 2021-11-02 ENCOUNTER — Encounter: Payer: Self-pay | Admitting: Family Medicine

## 2021-11-02 VITALS — BP 112/70 | HR 87 | Temp 98.1°F | Wt 238.4 lb

## 2021-11-02 DIAGNOSIS — M25511 Pain in right shoulder: Secondary | ICD-10-CM

## 2021-11-02 MED ORDER — MELOXICAM 15 MG PO TABS: ORAL_TABLET | ORAL | 0 refills | Status: DC

## 2021-11-02 NOTE — Progress Notes (Signed)
? ?  Subjective:  ? ? Patient ID: James Bird, male    DOB: Dec 17, 1960, 61 y.o.   MRN: [id number] ? ?HPI ?He is here for evaluation of a 1 week history of right shoulder numbness when he lies on that side.  When he rolls over, the numbness goes away.  He does not describe it is going down into a particular nerve root pattern.  He now mainly complains of right shoulder pain stating he is unable to abduct the shoulder to the point where it is difficult to lift his arm up to do computer work.  No numbness, tingling or weakness in his hands.  No history of motor function issues. ? ? ?Review of Systems ? ?   ?Objective:  ? Physical Exam ?Any minimal motion of the shoulder causes pain.  He did have some slight tenderness to palpation in the area of the bicipital groove but otherwise been unable to examine his shoulder. ? ? ? ?   ?Assessment & Plan:  ?Acute pain of right shoulder - Plan: DG Shoulder Right, meloxicam (MOBIC) 15 MG tablet ?Take a total of 15 mg of meloxicam per day.  Heat to your shoulder for 20 minutes 3 times per day.  Follow-up after the x-ray.  Also 2 Tylenol 4 times per day. ?Return here in 2 weeks or sooner if having difficulty. ?

## 2021-11-02 NOTE — Patient Instructions (Addendum)
Take a total of 15 mg of meloxicam per day.  Heat to your shoulder for 20 minutes 3 times per day.  Follow-up after the x-ray.  Also 2 Tylenol 4 times per day. ?

## 2021-11-16 ENCOUNTER — Ambulatory Visit: Payer: BC Managed Care – PPO | Admitting: Family Medicine

## 2021-11-16 ENCOUNTER — Encounter: Payer: Self-pay | Admitting: Family Medicine

## 2021-11-16 VITALS — BP 108/68 | HR 69 | Temp 97.3°F | Wt 236.0 lb

## 2021-11-16 DIAGNOSIS — M1712 Unilateral primary osteoarthritis, left knee: Secondary | ICD-10-CM | POA: Diagnosis not present

## 2021-11-16 DIAGNOSIS — M25511 Pain in right shoulder: Secondary | ICD-10-CM

## 2021-11-16 MED ORDER — TRIAMCINOLONE ACETONIDE 40 MG/ML IJ SUSP
40.0000 mg | Freq: Once | INTRAMUSCULAR | Status: AC
  Administered 2021-11-16: 40 mg via INTRAMUSCULAR

## 2021-11-16 MED ORDER — LIDOCAINE HCL 1 % IJ SOLN: 3.0000 mL | Freq: Once | INTRAMUSCULAR | Status: DC

## 2021-11-16 MED ORDER — LIDOCAINE HCL 1 % IJ SOLN
3.0000 mL | Freq: Once | INTRAMUSCULAR | Status: AC
  Administered 2021-11-16: 3 mL via INTRADERMAL

## 2021-11-16 MED ORDER — LIDOCAINE-EPINEPHRINE (PF) 1 %-1:200000 IJ SOLN: 3.0000 mL | Freq: Once | INTRAMUSCULAR | Status: DC

## 2021-11-16 MED ORDER — TRIAMCINOLONE ACETONIDE 40 MG/ML IJ SUSP: 40.0000 mg | Freq: Once | INTRAMUSCULAR | Status: DC

## 2021-11-16 NOTE — Addendum Note (Signed)
Addended by: Elyse Jarvis on: 11/16/2021 02:02 PM ? ? Modules accepted: Orders ? ?

## 2021-11-16 NOTE — Progress Notes (Signed)
? ?  Subjective:  ? ? Patient ID: James Bird, male    DOB: June 23, 1961, 61 y.o.   MRN: [id number] ? ?HPI ?He is here for a recheck.  He has been taking an anti-inflammatory and using heat on his right shoulder.  He states that the level of pain is down to a 3 out of 10.  He continues have difficulty with left knee pain and has had x-rays on it which does show severe degenerative changes especially medially.  It is interfering with his activities of daily living. ? ? ?Review of Systems ? ?   ?Objective:  ? Physical Exam ?Right shoulder exam shows no palpable tenderness over AC joint or bicipital groove.  Slightly limited range of motion with abduction and external rotation as well as internal rotation.  Negative sulcus sign.  Neer's and Hawkins test negative.  X-rays were reviewed and does show evidence of arthritic changes. ?Exam of the right knee shows full motion with no crepitus, palpable pain or effusion.  X-rays were reviewed and does show evidence of medial joint ? ? ?   ?Assessment & Plan:  ?Acute pain of right shoulder ?I will refer him to physical therapy for good shoulder rehab program to help with his range of motion. ? ?Osteoarthritis of left knee, unspecified osteoarthritis type ?After discussion with him concerning care of this his knee especially since he has medial joint line arthritic changes, he elected to have an injection.  40 mg of Kenalog and 3 cc of Xylocaine was injected into the joint space without difficulty.  Explained that if this lasts for a significant period of time we can redo this but the shorter the interval the more likely I will be to refer to orthopedics.  He was comfortable with that. ? ?

## 2021-11-24 ENCOUNTER — Ambulatory Visit: Payer: BC Managed Care – PPO | Admitting: Rehabilitative and Restorative Service Providers"

## 2022-02-09 ENCOUNTER — Ambulatory Visit: Payer: BC Managed Care – PPO | Admitting: Rehabilitative and Restorative Service Providers"

## 2022-02-15 ENCOUNTER — Telehealth: Payer: Self-pay | Admitting: Family Medicine

## 2022-02-15 NOTE — Telephone Encounter (Signed)
Referral followup

## 2022-03-01 ENCOUNTER — Encounter: Payer: Self-pay | Admitting: Physical Therapy

## 2022-03-01 ENCOUNTER — Ambulatory Visit (INDEPENDENT_AMBULATORY_CARE_PROVIDER_SITE_OTHER): Payer: BC Managed Care – PPO | Admitting: Physical Therapy

## 2022-03-01 DIAGNOSIS — M6281 Muscle weakness (generalized): Secondary | ICD-10-CM | POA: Diagnosis not present

## 2022-03-01 DIAGNOSIS — M25562 Pain in left knee: Secondary | ICD-10-CM | POA: Diagnosis not present

## 2022-03-01 DIAGNOSIS — M25511 Pain in right shoulder: Secondary | ICD-10-CM

## 2022-03-01 DIAGNOSIS — R262 Difficulty in walking, not elsewhere classified: Secondary | ICD-10-CM

## 2022-03-01 DIAGNOSIS — M25611 Stiffness of right shoulder, not elsewhere classified: Secondary | ICD-10-CM | POA: Diagnosis not present

## 2022-03-01 DIAGNOSIS — G8929 Other chronic pain: Secondary | ICD-10-CM

## 2022-03-01 NOTE — Therapy (Addendum)
OUTPATIENT PHYSICAL THERAPY SHOULDER EVALUATION Discharge   Patient Name: James Bird MRN: [id number] DOB:[birth year], 61 y.o., male Today's Date: 03/01/2022    Past Medical History:  Diagnosis Date   Allergy    RHINITS   Chronic shoulder pain    left   Constipation 2019   Diverticulosis    ED (erectile dysfunction)    Former smoker    quit late 2018   H/O echocardiogram 03/20/08   mild - moderate LVH, EF 50-55%, mild RV enlargement, mild mitral regurgitation, Dr. 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 Verlon Setting   History of neck surgery    Hyperlipidemia    Hypertension    Impaired fasting glucose    Past Surgical History:  Procedure Laterality Date   COLONOSCOPY  10/2010   diverticulosis, othewrise normal, repeat 10 years, Dr. Henrene Pastor   SPINE SURGERY  2011(JENKINS,MD)   [facility]   Patient Active Problem List   Diagnosis Date Noted   Former smoker 08/24/2021   History of colonic polyps 08/24/2021   H/O cervical discectomy 08/24/2021   Constipation 04/16/2020   Family history of heart disease 02/21/2019   Paresthesia 05/24/2018   Elevated PSA 05/24/2018   Nail deformity 05/24/2018   Onychomycosis 05/24/2018   Tinea pedis 05/24/2018   Lumbar radiculopathy 04/25/2018   Osteoarthritis of left knee 03/29/2017   Clonus 03/29/2017   Right foot drop 03/29/2017   Routine general medical examination at a health care facility 12/14/2015   Obesity 12/11/2014   Hyperlipidemia 11/28/2012   Erectile dysfunction 11/28/2012   Essential hypertension, benign 11/02/2011   Impaired fasting blood sugar 11/02/2011    PCP: none  REFERRING PROVIDER: Denita Lung, MD  REFERRING DIAG: M25.511 (ICD-10-CM) - Acute pain of right shoulder M17.12 (ICD-10-CM) - Osteoarthritis of left knee, unspecified osteoarthritis type  THERAPY DIAG:  No diagnosis  found.  Rationale for Evaluation and Treatment Rehabilitation  ONSET DATE: Rt shoulder about 1 year   Left knee for several years, pt stating he injured it when he was young probably playing football  SUBJECTIVE:                                                                                                                                                                                      SUBJECTIVE STATEMENT: Pt arriving reporting Rt shoulder pain which began about 1 year ago. Pt stating when he wakes up he has pain in Rt shoulder where he can barely lift his arm. Pt stating throughout the day  it gets a little better. Pt stating it affects his dressing and daily activities and he is unable to lift his arm above his shoulder. Pt stating knee pain began several years ago. Pt feels he may have a ligament tear that was never dx. Pt stating after yard work or increased activiites his knee "flares up" and sometimes swells.   PERTINENT HISTORY: allergy, chronic shoulder pain, diverticulosis, neck surgery >10 years, HTN  PAIN:  Are you having pain? Yes: NPRS scale: 2-3/10 Pain location: Rt shoulder Pain description: achy Aggravating factors: reaching above head, lifting Relieving factors: resting, pain meds as needed , over the counter  PRECAUTIONS: None  WEIGHT BEARING RESTRICTIONS No  FALLS:  Has patient fallen in last 6 months? No  LIVING ENVIRONMENT: Lives with: lives with their family Lives in: House/apartment Stairs: Yes: External: 3 steps; none Has following equipment at home: None  OCCUPATION: Clinical biochemist   PLOF: Independent  PATIENT GOALS:  "For my Rt shoulder to be pain free with activities"   OBJECTIVE:   DIAGNOSTIC FINDINGS:  Mild glenohumeral and moderate acromioclavicular osteoarthritis.  Mild distal lateral subacromial spurring. Severe medial and patellofemoral compartment osteoarthritis.    PATIENT SURVEYS:  03/01/2022 FOTO 76%  (predicted  77%)  COGNITION:  Overall cognitive status: Within functional limits for tasks assessed     SENSATION: WFL  POSTURE: Rounded shoulders, forward head  UPPER EXTREMITY ROM:    ROM Right 03/01/22  Left 03/01/22  Shoulder flexion 140 150  Shoulder extension    Shoulder abduction 64 146  Shoulder adduction    Shoulder internal rotation 75 80  Shoulder external rotation 55 72  Elbow flexion [facility] WFL  Elbow extension 0 0  Wrist flexion    Wrist extension    Wrist ulnar deviation    Wrist radial deviation    Wrist pronation    Wrist supination    (Blank rows = not tested)  AROM Rt 03/01/22 Left 03/01/22  Knee extension -8 -5  Knee flexion 118 124        UPPER EXTREMITY MMT:  MMT Right Eval 03/01/22 Left Eval 03/01/22  Shoulder flexion 3/5 5/5  Shoulder extension 3/5 5/5  Shoulder abduction 3-/5 5/5  Shoulder adduction    Shoulder internal rotation 4/5 5/5  Shoulder external rotation 3/5 5/5  Middle trapezius    Lower trapezius    (Blank rows = not tested)  MMT Rt 03/01/22 Left 03/01/22  Knee extension 5/5 4/5  Knee flexion 5/5 4/5        SHOULDER SPECIAL TESTS:  03/01/2022: Rotator cuff assessment: Empty can test: positive Rt      PALPATION:  TTP: Rt shoulder anterior AC joint, active trigger points in Rt upper trap and levator   TODAY'S TREATMENT:   03/01/22 HEP instruction/performance c cues for techniques, handout provided.  Trial set performed of each for comprehension and symptom assessment.  See below for exercise list.   PATIENT EDUCATION: Education details: PT POC, HEP, DN handout issued and edu on process.  Person educated: Patient Education method: Explanation, Demonstration, Tactile cues, Verbal cues, and Handouts Education comprehension: verbalized understanding, returned demonstration, and needs further education   HOME EXERCISE PROGRAM: Access Code: [email or web address] URL: [email or web address] Date: 03/01/2022 Prepared by:  Kearney Hard  Exercises - Sidelying Hip Abduction  - 2 x daily - 7 x weekly - 2 sets - 10 reps - 3 seconds hold - Supine Active Straight Leg Raise  - 2 x daily - 7 x weekly -  2 sets - 10 reps - 3 seconds hold - Supine Knee Extension Strengthening  - 2 x daily - 7 x weekly - 2 sets - 10 reps - 5 seconds hold - Standing Shoulder Row with Anchored Resistance  - 2 x daily - 7 x weekly - 2 sets - 10 reps - 3 seconds hold - Supine Shoulder Flexion with Dowel  - 2 x daily - 7 x weekly - 3 sets - 10 reps - 3 seconds hold - Supine Shoulder Abduction AAROM with Dowel  - 1 x daily - 7 x weekly - 2 sets - 10 reps - 3 seconds hold - Supine Shoulder External Rotation in 45 Degrees Abduction AAROM with Dowel  - 2 x daily - 7 x weekly - 2 sets - 10 reps - 3 seconds hold  ASSESSMENT:  CLINICAL IMPRESSION: Patient is a 61 y.o. who comes to clinic with complaints of Rt shoulder pain and left knee pain with mobility, strength and movement coordination deficits that impair their ability to perform usual daily and recreational functional activities without increase difficulty/symptoms.  Patient to benefit from skilled PT services to address impairments and limitations to improve to previous level of function without restriction secondary to condition.      OBJECTIVE IMPAIRMENTS decreased mobility, difficulty walking, decreased ROM, decreased strength, impaired flexibility, and pain.   ACTIVITY LIMITATIONS carrying, lifting, squatting, stairs, and reach over head  PARTICIPATION LIMITATIONS: community activity and yard work  PERSONAL FACTORS allergy, chronic shoulder pain, diverticulosis, neck surgery, HTN are also affecting pt's outcome  REHAB POTENTIAL: Good  CLINICAL DECISION MAKING: Stable/uncomplicated  EVALUATION COMPLEXITY: Low   GOALS: Goals reviewed with patient? Yes  SHORT TERM GOALS: Target date: 03/29/22 Patient will demonstrate independent use of initial home exercise program to maintain  progress from in clinic treatments. Goal status: New   Long term PT goals:  Target Date: 04/29/22  Patient will demonstrate/report pain at worst less than or equal to 2/10 to facilitate minimal limitation in daily activity secondary to pain symptoms. Goal status: New   Patient will demonstrate independent use of home exercise program to facilitate ability to maintain/progress functional gains from skilled physical therapy services. Goal status: New   Patient will demonstrate FOTO outcome > or = 77% % to indicate reduced disability due to condition. Goal status: New   Pt will be able to improve his active Rt shoulder abduction to >/= 140 degrees to improve ADL's.  Goal status: New       5.  Pt will improve Left hip and knee strength to 5/5 to improve functional mobility.   Goal status: New  PLAN: PT FREQUENCY: 2x/week  PT DURATION: 8 weeks  PLANNED INTERVENTIONS: Therapeutic exercises, Therapeutic activity, Neuromuscular re-education, Balance training, Gait training, Patient/Family education, Self Care, Joint mobilization, Stair training, Dry Needling, Cryotherapy, Moist heat, Taping, Ultrasound, Ionotophoresis 17m/ml Dexamethasone, and Manual therapy  PLAN FOR NEXT SESSION: build and progress pt's HEP for shoulder and knee, assess for DN in Rt shoulder, shoulder ROM, strengthening focusing on abduction, flexion and ER.    JOretha Caprice PT, MPT 03/01/2022, 8:01 AM   PHYSICAL THERAPY DISCHARGE SUMMARY  Visits from Start of Care: 1  Current functional level related to goals / functional outcomes: See above, pt never returned for treatment   Remaining deficits: See above   Education / Equipment: HEP   Patient agrees to discharge. Patient goals were not met. Patient is being discharged due to not returning since the last  visit.

## 2022-04-20 ENCOUNTER — Encounter: Payer: Self-pay | Admitting: Internal Medicine

## 2022-05-10 ENCOUNTER — Other Ambulatory Visit (INDEPENDENT_AMBULATORY_CARE_PROVIDER_SITE_OTHER): Payer: BC Managed Care – PPO

## 2022-05-10 DIAGNOSIS — Z23 Encounter for immunization: Secondary | ICD-10-CM | POA: Diagnosis not present

## 2022-05-17 ENCOUNTER — Ambulatory Visit: Payer: BC Managed Care – PPO | Admitting: Family Medicine

## 2022-05-17 ENCOUNTER — Telehealth: Payer: Self-pay | Admitting: Family Medicine

## 2022-05-17 VITALS — BP 128/72 | HR 76 | Temp 98.1°F | Wt 239.2 lb

## 2022-05-17 DIAGNOSIS — Z716 Tobacco abuse counseling: Secondary | ICD-10-CM | POA: Diagnosis not present

## 2022-05-17 DIAGNOSIS — N528 Other male erectile dysfunction: Secondary | ICD-10-CM | POA: Diagnosis not present

## 2022-05-17 DIAGNOSIS — K5900 Constipation, unspecified: Secondary | ICD-10-CM | POA: Diagnosis not present

## 2022-05-17 MED ORDER — SILDENAFIL CITRATE 100 MG PO TABS: 100.0000 mg | ORAL_TABLET | Freq: Every day | ORAL | 5 refills | Status: DC | PRN

## 2022-05-17 MED ORDER — LINACLOTIDE 72 MCG PO CAPS: 72.0000 ug | ORAL_CAPSULE | Freq: Every day | ORAL | 0 refills | Status: DC

## 2022-05-17 MED ORDER — TADALAFIL 20 MG PO TABS: 20.0000 mg | ORAL_TABLET | Freq: Every day | ORAL | 5 refills | Status: DC | PRN

## 2022-05-17 NOTE — Progress Notes (Signed)
   Subjective:    Patient ID: Erie Noe, male    DOB: 11-02-1960, 61 y.o.   MRN: [id number]  HPI He is here for consult concerning lung cancer and screening.  He smokes irregularly and usually no more than a quarter of a pack per session usually when he drinks.  Recently he did cut back he denies alcohol consumption.  He would also like a refill on his sildenafil.  Uses on an as-needed basis.  He also has a history of constipation and in the past would occasionally use Linzess.  He does also use MiraLAX again more or less on an as-needed basis   Review of Systems     Objective:   Physical Exam Alert and in no distress otherwise not examined.       Assessment & Plan:  Encounter for smoking cessation counseling  Other male erectile dysfunction - Plan: sildenafil (VIAGRA) 100 MG tablet  Constipation, unspecified constipation type - Plan: linaclotide (LINZESS) 72 MCG capsule I explained that he did not meet the criteria for lung cancer screening.  Have been discussed pros and cons of cancer screening in terms of positive and negative testing results. Discussed constipation with him in regard to fluids, bulk in her diet, exercise.  I will give him samples of Linzess and recommend more continuous use of MiraLAX.

## 2022-05-17 NOTE — Telephone Encounter (Signed)
Fax from [employer] Sildenafil '100mg'$  tablets # 30   Drug not covered by patient plan. The preferred alternative is Tadalafil Tab, Vardenafil tab Please call or fax pharmacy to change

## 2022-05-26 ENCOUNTER — Telehealth: Payer: Self-pay | Admitting: Family Medicine

## 2022-05-26 NOTE — Telephone Encounter (Signed)
P.A. TADALADIL/SILDENAFIL both plan exclusion.  No options for coverage.  Can use Good rx discount card or can switch pt to online pharmacy like [place] or [place] for much cheaper prices if interested.  Left message for pt

## 2022-06-06 ENCOUNTER — Encounter: Payer: Self-pay | Admitting: Internal Medicine

## 2022-07-29 ENCOUNTER — Telehealth: Payer: Self-pay | Admitting: Family Medicine

## 2022-07-29 MED ORDER — VALACYCLOVIR HCL 1 G PO TABS: 1000.0000 mg | ORAL_TABLET | Freq: Two times a day (BID) | ORAL | 0 refills | Status: DC

## 2022-07-29 NOTE — Telephone Encounter (Signed)
Pt states he feels a herpes flare up coming on & would like a refill on his valacyclovir, hasn't had to have it for years.  Please send to [facility]

## 2022-08-02 LAB — PSA TOTAL (REFLEX TO FREE): PSA, Total: 5.9

## 2022-09-01 ENCOUNTER — Encounter: Payer: BC Managed Care – PPO | Admitting: Family Medicine

## 2022-09-19 ENCOUNTER — Encounter: Payer: BC Managed Care – PPO | Admitting: Family Medicine

## 2022-09-22 ENCOUNTER — Ambulatory Visit: Payer: BC Managed Care – PPO | Admitting: Family Medicine

## 2022-10-20 ENCOUNTER — Encounter: Payer: Self-pay | Admitting: Nurse Practitioner

## 2022-10-20 ENCOUNTER — Ambulatory Visit: Payer: BC Managed Care – PPO | Admitting: Nurse Practitioner

## 2022-10-20 VITALS — BP 130/84 | HR 68 | Ht 71.5 in | Wt 237.0 lb

## 2022-10-20 DIAGNOSIS — E669 Obesity, unspecified: Secondary | ICD-10-CM

## 2022-10-20 DIAGNOSIS — M1712 Unilateral primary osteoarthritis, left knee: Secondary | ICD-10-CM

## 2022-10-20 DIAGNOSIS — I10 Essential (primary) hypertension: Secondary | ICD-10-CM | POA: Diagnosis not present

## 2022-10-20 DIAGNOSIS — Z87891 Personal history of nicotine dependence: Secondary | ICD-10-CM | POA: Diagnosis not present

## 2022-10-20 DIAGNOSIS — E559 Vitamin D deficiency, unspecified: Secondary | ICD-10-CM

## 2022-10-20 DIAGNOSIS — Z6832 Body mass index (BMI) 32.0-32.9, adult: Secondary | ICD-10-CM

## 2022-10-20 DIAGNOSIS — Z0000 Encounter for general adult medical examination without abnormal findings: Secondary | ICD-10-CM

## 2022-10-20 DIAGNOSIS — R768 Other specified abnormal immunological findings in serum: Secondary | ICD-10-CM

## 2022-10-20 DIAGNOSIS — E782 Mixed hyperlipidemia: Secondary | ICD-10-CM

## 2022-10-20 DIAGNOSIS — Z8249 Family history of ischemic heart disease and other diseases of the circulatory system: Secondary | ICD-10-CM

## 2022-10-20 LAB — LIPID PANEL

## 2022-10-20 NOTE — Assessment & Plan Note (Signed)
Initial blood pressure readings were elevated at 148/88, but upon rechecking, they improved to 130/84, aligning more closely with the patient's baseline. Plan:   - No adjustments to the hypertension medication regimen were discussed during this visit.

## 2022-10-20 NOTE — Assessment & Plan Note (Signed)
The patient reports experiencing left knee pain chronically, noting that the pain worsens with long-distance walking and improves with rest. The pain is described as achy when sitting. Previous interventions include an injection administered by Dr. Redmond School and undergoing physical therapy. The patient inquires about gel injections for management as opposed to additional steroids.  Plan:   - Refer the patient for an evaluation for gel injections or PRP (protein-rich plasma) therapy.   - Arrange for recent knee x-rays to be assessed by an orthopedic specialist.

## 2022-10-20 NOTE — Assessment & Plan Note (Signed)
CPE today with no abnormalities noted on exam.  Labs pending. Will make changes as necessary based on results.  Review of HM activities and recommendations discussed and provided on AVS Anticipatory guidance, diet, and exercise recommendations provided.  Medications, allergies, and hx reviewed and updated as necessary.  COVID vaccine booster provided.  STI screening completed.  Plan to f/u with CPE in 1 year or sooner for acute/chronic health needs as directed.

## 2022-10-20 NOTE — Assessment & Plan Note (Signed)
The patient has an on and off history of smoking, with a noted decrease in smoking frequency over the past five years, and a family history of lung cancer. He estimates his PPY history to be about 25 years.  Plan:   -Referral placed for lung cancer screening.    -Cessation is encouraged.

## 2022-10-20 NOTE — Progress Notes (Signed)
BP 130/84   Pulse 68   Ht 5' 11.5" (1.816 m)   Wt 237 lb (107.5 kg)   BMI 32.59 kg/m    Subjective:    Patient ID: James Bird, male    DOB: July 31, 1961, 62 y.o.   MRN: [id number]  HPI: James Bird is a 62 y.o. male presenting on 10/20/2022 for comprehensive medical examination.   The patient presents today for a physical examination, voicing concerns about persistent left knee pain and inquiring about the possibility of undergoing a lung cancer screening. The patient describes the knee pain as having a duration of approximately one month, noting an increase in discomfort when walking long distances. The pain, characterized as achy, reportedly subsides when avoiding such activities, though discomfort persists while sitting. The patient mentions receiving an injection from Dr. Redmond School a few months prior, which alleviated the pain temporarily, and has a history of engaging in physical therapy for this issue.  With a smoking history spanning 30 years, the patient acknowledges a significant increase in smoking frequency over the past 10 to 15 years, followed by a gradual reduction over the last five years to an occasional two to three cigarettes per day. The patient's family history includes lung cancer, prompting the request for a lung cancer screening.  IMMUNIZATIONS:   Flu: Flu vaccine completed elsewhere this season Prevnar 13: Prevnar 13 N/A for this patient Pneumovax 23: Pneumovax 23 N/A for this patient Prevnar 20: Prevnar 20 N/A for this patient HPV: HPV N/A for this patient Vac Shingrix: Shingrix completed, documentation in chart (Dose # 2/2) Tetanus: Tetanus completed in the last 10 years Covid-19 vaccine status: Information provided on how to obtain vaccines.   HEALTH MAINTENANCE: Colon Cancer Screening HM Status: is up to date STI Testing HM Status: was declined, but later requested PSA Screen HM Status: is up to date Lung Ca Screen HM Status: ordered today AAA Screen  HM Status: consider in the future  He reports regular vision exams q1-5y: No  He reports regular dental exams q 67m  Yes  Mix of home cooking and restaurant food. He endorses exercise and/or activity of: Moderate 30 min 3-4x/wk Mode: has not been doing much lately due to knee pain  He currently: Marital Status: single Living situation: alone Sexual: sexually active Occupation: IT- [employer] Pertinent items are noted in HPI.  Most Recent Depression Screen:     10/20/2022    9:09 AM 08/24/2021    3:14 PM 05/04/2021    8:20 AM 03/11/2020    8:15 AM 04/24/2018   12:33 PM  Depression screen PHQ 2/9  Decreased Interest 0 0 0 0 0  Down, Depressed, Hopeless 0 0 0 0   PHQ - 2 Score 0 0 0 0 0   Most Recent Anxiety Screen:     No data to display         Most Recent Falls Screen:    10/20/2022    9:09 AM 03/11/2020    8:15 AM 04/24/2018   12:33 PM 03/29/2017    9:39 AM  Fall Risk   Falls in the past year? 0 0 No No  Number falls in past yr: 0     Injury with Fall? 0     Risk for fall due to : No Fall Risks     Follow up Falls evaluation completed       Past medical history, surgical history, medications, allergies, family history and social history reviewed with  patient today and changes made to appropriate areas of the chart.  Past Medical History:  Past Medical History:  Diagnosis Date   Allergy    RHINITS   Chronic shoulder pain    left   Constipation 2019   Diverticulosis    ED (erectile dysfunction)    Former smoker    quit late 2018   H/O echocardiogram 03/20/08   mild - moderate LVH, EF 50-55%, mild RV enlargement, mild mitral regurgitation, Dr. 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 Verlon Setting   History of neck surgery    Hyperlipidemia    Hypertension    Impaired fasting glucose    Medications:  Current Outpatient Medications on File Prior to  Visit  Medication Sig   atorvastatin (LIPITOR) 20 MG tablet Take 1 tablet (20 mg total) by mouth daily.   chlorhexidine (PERIDEX) 0.12 % solution 15 mLs 2 (two) times daily.   lisinopril-hydrochlorothiazide (ZESTORETIC) 20-12.5 MG tablet Take 1 tablet by mouth daily.   valACYclovir (VALTREX) 1000 MG tablet Take 1 tablet (1,000 mg total) by mouth 2 (two) times daily. (Patient not taking: Reported on 10/20/2022)   linaclotide (LINZESS) 72 MCG capsule Take 1 capsule (72 mcg total) by mouth daily before breakfast. (Patient not taking: Reported on 10/20/2022)   meloxicam (MOBIC) 15 MG tablet TAKE 1 TABLET(15 MG) BY MOUTH DAILY (Patient not taking: Reported on 10/20/2022)   predniSONE (STERAPRED UNI-PAK 21 TAB) 10 MG (21) TBPK tablet Take as per manufacturer's recommendation (Patient not taking: Reported on 08/24/2021)   tadalafil (CIALIS) 20 MG tablet Take 1 tablet (20 mg total) by mouth daily as needed for erectile dysfunction. (Patient not taking: Reported on 10/20/2022)   Current Facility-Administered Medications on File Prior to Visit  Medication   0.9 %  sodium chloride infusion   Surgical History:  Past Surgical History:  Procedure Laterality Date   COLONOSCOPY  10/2010   diverticulosis, othewrise normal, repeat 10 years, Dr. Henrene Pastor   SPINE SURGERY  2011(JENKINS,MD)   CERVICAL DISC AND FUSION   Allergies:  No Known Allergies Social History:  Social History   Socioeconomic History   Marital status: Single    Spouse name: Not on file   Number of children: Not on file   Years of education: Not on file   Highest education level: Not on file  Occupational History   Occupation: Clinical biochemist    Employer: [employer]  Tobacco Use   Smoking status: Former    Packs/day: 0.50    Years: 16.00    Total pack years: 8.00    Types: Cigarettes    Quit date: 05/15/2017    Years since quitting: 5.4   Smokeless tobacco: Never  Vaping Use   Vaping Use: Never used  Substance and Sexual  Activity   Alcohol use: Yes    Alcohol/week: 10.0 standard drinks of alcohol    Types: 10 Cans of beer per week    Comment: 10 drinks per week, sometimes more   Drug use: Not Currently    Comment: 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    02/2020  Social Determinants of Health   Financial Resource Strain: Not on file  Food Insecurity: Not on file  Transportation Needs: Not on file  Physical Activity: Not on file  Stress: Not on file  Social Connections: Not on file  Intimate Partner Violence: Not on file   Social History   Tobacco Use  Smoking Status Former   Packs/day: 0.50   Years: 16.00   Total pack years: 8.00   Types: Cigarettes   Quit date: 05/15/2017   Years since quitting: 5.4  Smokeless Tobacco Never   Social History   Substance and Sexual Activity  Alcohol Use Yes   Alcohol/week: 10.0 standard drinks of alcohol   Types: 10 Cans of beer per week   Comment: 10 drinks per week, sometimes more   Family History:  Family History  Problem Relation Age of Onset   Cancer Mother        LUNG   Heart disease Father 58       MI   Cancer Paternal Aunt        type unknown   Cancer Paternal Uncle        type unknown   Hypertension Brother    Diabetes Neg Hx    Stroke Neg Hx    Colon cancer Neg Hx    Esophageal cancer Neg Hx    Pancreatic cancer Neg Hx    Stomach cancer Neg Hx    Rectal cancer Neg Hx        Objective:    BP 130/84   Pulse 68   Ht 5' 11.5" (1.816 m)   Wt 237 lb (107.5 kg)   BMI 32.59 kg/m   Wt Readings from Last 3 Encounters:  10/20/22 237 lb (107.5 kg)  05/17/22 239 lb 3.2 oz (108.5 kg)  11/16/21 236 lb (107 kg)    Physical Exam Vitals and nursing note reviewed.  Constitutional:       General: He is not in acute distress.    Appearance: Normal appearance.  HENT:     Head: Normocephalic and atraumatic.     Right Ear: Hearing, tympanic membrane, ear canal and external ear normal.     Left Ear: Hearing, tympanic membrane, ear canal and external ear normal.     Nose: Nose normal.     Right Sinus: No maxillary sinus tenderness or frontal sinus tenderness.     Left Sinus: No maxillary sinus tenderness or frontal sinus tenderness.     Mouth/Throat:     Lips: Pink.     Mouth: Mucous membranes are moist.     Pharynx: Oropharynx is clear.  Eyes:     General: Lids are normal. Vision grossly intact.     Extraocular Movements: Extraocular movements intact.     Conjunctiva/sclera: Conjunctivae normal.     Pupils: Pupils are equal, round, and reactive to light.     Funduscopic exam:    Right eye: No hemorrhage. Red reflex present.        Left eye: No hemorrhage. Red reflex present.    Visual Fields: Right eye visual fields normal and left eye visual fields normal.  Neck:     Thyroid: No thyromegaly.     Vascular: No carotid bruit or JVD.  Cardiovascular:     Rate and Rhythm: Normal rate and regular rhythm.     Chest Wall: PMI is not displaced.     Pulses: Normal pulses.          Dorsalis pedis pulses are 2+  on the right side and 2+ on the left side.       Posterior tibial pulses are 2+ on the right side and 2+ on the left side.     Heart sounds: Normal heart sounds. No murmur heard. Pulmonary:     Effort: Pulmonary effort is normal. No respiratory distress.     Breath sounds: Normal breath sounds.  Chest:  Breasts:    Breasts are symmetrical.  Abdominal:     General: Bowel sounds are normal. There is no distension or abdominal bruit.     Palpations: Abdomen is soft. There is no hepatomegaly, splenomegaly or mass.     Tenderness: There is no abdominal tenderness. There is no right CVA tenderness, left CVA tenderness, guarding or rebound.  Musculoskeletal:         General: Tenderness present.     Cervical back: Full passive range of motion without pain and neck supple. No tenderness. No spinous process tenderness or muscular tenderness.     Right lower leg: No edema.     Left lower leg: No edema.     Comments: Tenderness present to the left knee with no laxity noted in the joint on examination. No swelling observed. Tenderness with palpation over the patella. Crepitus present with movement   Feet:     Right foot:     Toenail Condition: Right toenails are normal.     Left foot:     Toenail Condition: Left toenails are normal.  Lymphadenopathy:     Cervical: No cervical adenopathy.     Upper Body:     Right upper body: No supraclavicular adenopathy.     Left upper body: No supraclavicular adenopathy.  Skin:    General: Skin is warm and dry.     Capillary Refill: Capillary refill takes less than 2 seconds.     Nails: There is no clubbing.  Neurological:     General: No focal deficit present.     Mental Status: He is alert and oriented to person, place, and time.     Cranial Nerves: No cranial nerve deficit.     Sensory: Sensation is intact. No sensory deficit.     Motor: Motor function is intact. No weakness.     Coordination: Coordination is intact. Coordination normal.     Gait: Gait is intact. Gait normal.  Psychiatric:        Attention and Perception: Attention normal.        Mood and Affect: Mood normal.        Speech: Speech normal.        Behavior: Behavior normal. Behavior is cooperative.        Thought Content: Thought content normal.        Cognition and Memory: Cognition and memory normal.        Judgment: Judgment normal.     Results for orders placed or performed in visit on 08/18/22  PSA Total (Reflex To Free)  Result Value Ref Range   PSA, Total 5.90       Assessment & Plan:   Problem List Items Addressed This Visit     Essential hypertension, benign    Initial blood pressure readings were elevated at 148/88, but  upon rechecking, they improved to 130/84, aligning more closely with the patient's baseline. Plan:   - No adjustments to the hypertension medication regimen were discussed during this visit.      Relevant Orders   Ambulatory Referral for Lung Cancer Scre  Ambulatory referral to Orthopedic Surgery   CBC With Diff/Platelet   Comprehensive metabolic panel   Hemoglobin A1c   Lipid panel   VITAMIN D 25 Hydroxy (Vit-D Deficiency, Fractures)   Iron, TIBC and Ferritin Panel   Osteoarthritis of left knee    The patient reports experiencing left knee pain chronically, noting that the pain worsens with long-distance walking and improves with rest. The pain is described as achy when sitting. Previous interventions include an injection administered by Dr. Redmond School and undergoing physical therapy. The patient inquires about gel injections for management as opposed to additional steroids.  Plan:   - Refer the patient for an evaluation for gel injections or PRP (protein-rich plasma) therapy.   - Arrange for recent knee x-rays to be assessed by an orthopedic specialist.       Relevant Orders   Ambulatory Referral for Lung Cancer Scre   Ambulatory referral to Orthopedic Surgery   CBC With Diff/Platelet   Comprehensive metabolic panel   Hemoglobin A1c   Lipid panel   VITAMIN D 25 Hydroxy (Vit-D Deficiency, Fractures)   Iron, TIBC and Ferritin Panel   History of smoking 10-25 pack years    The patient has an on and off history of smoking, with a noted decrease in smoking frequency over the past five years, and a family history of lung cancer. He estimates his PPY history to be about 25 years.  Plan:   -Referral placed for lung cancer screening.    -Cessation is encouraged.       Relevant Orders   Ambulatory Referral for Lung Cancer Scre   Ambulatory referral to Orthopedic Surgery   CBC With Diff/Platelet   Comprehensive metabolic panel   Hemoglobin A1c   Lipid panel   VITAMIN D 25 Hydroxy  (Vit-D Deficiency, Fractures)   Iron, TIBC and Ferritin Panel   Family history of heart disease   Relevant Orders   Ambulatory Referral for Lung Cancer Scre   Ambulatory referral to Orthopedic Surgery   CBC With Diff/Platelet   Comprehensive metabolic panel   Hemoglobin A1c   Lipid panel   VITAMIN D 25 Hydroxy (Vit-D Deficiency, Fractures)   Iron, TIBC and Ferritin Panel   Obesity   Relevant Orders   Ambulatory Referral for Lung Cancer Scre   Ambulatory referral to Orthopedic Surgery   CBC With Diff/Platelet   Comprehensive metabolic panel   Hemoglobin A1c   Lipid panel   VITAMIN D 25 Hydroxy (Vit-D Deficiency, Fractures)   Iron, TIBC and Ferritin Panel   Hyperlipidemia   Relevant Orders   Ambulatory Referral for Lung Cancer Scre   Ambulatory referral to Orthopedic Surgery   CBC With Diff/Platelet   Comprehensive metabolic panel   Hemoglobin A1c   Lipid panel   VITAMIN D 25 Hydroxy (Vit-D Deficiency, Fractures)   Iron, TIBC and Ferritin Panel   Other Visit Diagnoses     Encounter for annual physical exam       Relevant Orders   Ambulatory Referral for Lung Cancer Scre   Ambulatory referral to Orthopedic Surgery   CBC With Diff/Platelet   Comprehensive metabolic panel   Hemoglobin A1c   Lipid panel   VITAMIN D 25 Hydroxy (Vit-D Deficiency, Fractures)   Iron, TIBC and Ferritin Panel   Health care maintenance       Relevant Orders   Ambulatory Referral for Lung Cancer Scre   Ambulatory referral to Orthopedic Surgery   CBC With Diff/Platelet   Comprehensive metabolic panel  Hemoglobin A1c   Lipid panel   VITAMIN D 25 Hydroxy (Vit-D Deficiency, Fractures)   Iron, TIBC and Ferritin Panel   Ct, Ng, Mycoplasmas NAA, Urine   HSV(herpes simplex vrs) 1+2 ab-IgG   RPR   HIV Antibody (routine testing w rflx)   Hepatitis C antibody   Hepatitis B surface antigen   Pfizer Fall 2023 Covid-19 Vaccine 76yr and older (Completed)   HSV 1 and 2 Ab, IgG        Follow up  plan: NEXT PREVENTATIVE PHYSICAL DUE IN 1 YEAR. No follow-ups on file.  LABORATORY TESTING:  Health maintenance labs ordered today, if applicable.     PATIENT COUNSELING:   For all adult patients, I recommend A well balanced diet low in saturated fats, cholesterol, and moderation in carbohydrates.   This can be as simple as monitoring portion sizes and cutting back on sugary beverages such as soda and juice to start with.    Daily water consumption of at least 64 ounces.  Physical activity at least 180 minutes per week, if just starting out.   This can be as simple as taking the stairs instead of the elevator and walking 2-3 laps around the office  purposefully every day.   STD protection, partner selection, and regular testing if high risk.  Limited consumption of alcoholic beverages if alcohol is consumed.  For women, I recommend no more than 7 alcoholic beverages per week, spread out throughout the week.  Avoid "binge" drinking or consuming large quantities of alcohol in one setting.   Please let me know if you feel you may need help with reduction or quitting alcohol consumption.   Avoidance of nicotine, if used.  Please let me know if you feel you may need help with reduction or quitting nicotine use.   Daily mental health attention.  This can be in the form of 5 minute daily meditation, prayer, journaling, yoga, reflection, etc.   Purposeful attention to your emotions and mental state can significantly improve your overall wellbeing  and  Health.  Please know that I am here to help you with all of your health care goals and am happy to work with you to find a solution that works best for you.  The greatest advice I have received with any changes in life are to take it one step at a time, that even means if all you can focus on is the next 60 seconds, then do that and celebrate your victories.  With any changes in life, you will have set backs, and that is OK. The important  thing to remember is, if you have a set back, it is not a failure, it is an opportunity to try again!  Health Maintenance Recommendations Screening Testing Mammogram Every 1 -2 years based on history and risk factors Starting at age 3630Pap Smear Ages 21-39 every 3 years Ages 353-65every 5 years with HPV testing More frequent testing may be required based on results and history Colon Cancer Screening Every 1-10 years based on test performed, risk factors, and history Starting at age 3638Bone Density Screening Every 2-10 years based on history Starting at age 7638for women Recommendations for men differ based on medication usage, history, and risk factors AAA Screening One time ultrasound Men 61172years old who have every smoked Lung Cancer Screening Low Dose Lung CT every 12 months Age 82-80 years with a 30 pack-year smoking history who still smoke or who have quit within the  last 15 years  Screening Labs Routine  Labs: Complete Blood Count (CBC), Complete Metabolic Panel (CMP), Cholesterol (Lipid Panel) Every 6-12 months based on history and medications May be recommended more frequently based on current conditions or previous results Hemoglobin A1c Lab Every 3-12 months based on history and previous results Starting at age 52 or earlier with diagnosis of diabetes, high cholesterol, BMI >26, and/or risk factors Frequent monitoring for patients with diabetes to ensure blood sugar control Thyroid Panel (TSH w/ T3 & T4) Every 6 months based on history, symptoms, and risk factors May be repeated more often if on medication HIV One time testing for all patients 6 and older May be repeated more frequently for patients with increased risk factors or exposure Hepatitis C One time testing for all patients 51 and older May be repeated more frequently for patients with increased risk factors or exposure Gonorrhea, Chlamydia Every 12 months for all sexually active persons 13-24  years Additional monitoring may be recommended for those who are considered high risk or who have symptoms PSA Men 69-25 years old with risk factors Additional screening may be recommended from age 101-69 based on risk factors, symptoms, and history  Vaccine Recommendations Tetanus Booster All adults every 10 years Flu Vaccine All patients 6 months and older every year COVID Vaccine All patients 12 years and older Initial dosing with booster May recommend additional booster based on age and health history HPV Vaccine 2 doses all patients age 2-26 Dosing may be considered for patients over 26 Shingles Vaccine (Shingrix) 2 doses all adults 70 years and older Pneumonia (Pneumovax 23) All adults 25 years and older May recommend earlier dosing based on health history Pneumonia (Prevnar 18) All adults 58 years and older Dosed 1 year after Pneumovax 23  Additional Screening, Testing, and Vaccinations may be recommended on an individualized basis based on family history, health history, risk factors, and/or exposure.

## 2022-10-21 LAB — HIV ANTIBODY (ROUTINE TESTING W REFLEX): HIV Screen 4th Generation wRfx: NONREACTIVE

## 2022-10-21 LAB — CBC WITH DIFF/PLATELET
Basophils Absolute: 0 10*3/uL (ref 0.0–0.2)
Basos: 0 %
EOS (ABSOLUTE): 0.1 10*3/uL (ref 0.0–0.4)
Eos: 2 %
Hematocrit: 44.9 % (ref 37.5–51.0)
Hemoglobin: 15 g/dL (ref 13.0–17.7)
Immature Grans (Abs): 0 10*3/uL (ref 0.0–0.1)
Immature Granulocytes: 0 %
Lymphocytes Absolute: 1.9 10*3/uL (ref 0.7–3.1)
Lymphs: 29 %
MCH: 29.4 pg (ref 26.6–33.0)
MCHC: 33.4 g/dL (ref 31.5–35.7)
MCV: 88 fL (ref 79–97)
Monocytes Absolute: 0.8 10*3/uL (ref 0.1–0.9)
Monocytes: 12 %
Neutrophils Absolute: 3.7 10*3/uL (ref 1.4–7.0)
Neutrophils: 57 %
Platelets: 256 10*3/uL (ref 150–450)
RBC: 5.11 x10E6/uL (ref 4.14–5.80)
RDW: 13.3 % (ref 11.6–15.4)
WBC: 6.6 10*3/uL (ref 3.4–10.8)

## 2022-10-21 LAB — IRON,TIBC AND FERRITIN PANEL
Ferritin: 1230 ng/mL — ABNORMAL HIGH (ref 30–400)
Iron Saturation: 51 % (ref 15–55)
Iron: 145 ug/dL (ref 38–169)
Total Iron Binding Capacity: 283 ug/dL (ref 250–450)
UIBC: 138 ug/dL (ref 111–343)

## 2022-10-21 LAB — LIPID PANEL
Chol/HDL Ratio: 3.4 ratio (ref 0.0–5.0)
Cholesterol, Total: 145 mg/dL (ref 100–199)
HDL: 43 mg/dL (ref >39)
LDL Chol Calc (NIH): 84 mg/dL (ref 0–99)
Triglycerides: 97 mg/dL (ref 0–149)
VLDL Cholesterol Cal: 18 mg/dL (ref 5–40)

## 2022-10-21 LAB — COMPREHENSIVE METABOLIC PANEL
ALT: 20 IU/L (ref 0–44)
AST: 20 IU/L (ref 0–40)
Albumin/Globulin Ratio: 1.9 (ref 1.2–2.2)
Albumin: 4.6 g/dL (ref 3.9–4.9)
Alkaline Phosphatase: 69 IU/L (ref 44–121)
BUN/Creatinine Ratio: 16 (ref 10–24)
BUN: 14 mg/dL (ref 8–27)
Bilirubin Total: 0.8 mg/dL (ref 0.0–1.2)
CO2: 24 mmol/L (ref 20–29)
Calcium: 10.1 mg/dL (ref 8.6–10.2)
Chloride: 103 mmol/L (ref 96–106)
Creatinine, Ser: 0.85 mg/dL (ref 0.76–1.27)
Globulin, Total: 2.4 g/dL (ref 1.5–4.5)
Glucose: 97 mg/dL (ref 70–99)
Potassium: 4 mmol/L (ref 3.5–5.2)
Sodium: 142 mmol/L (ref 134–144)
Total Protein: 7 g/dL (ref 6.0–8.5)
eGFR: 98 mL/min/{1.73_m2} (ref >59)

## 2022-10-21 LAB — HEPATITIS C ANTIBODY: Hep C Virus Ab: NONREACTIVE

## 2022-10-21 LAB — HEPATITIS B SURFACE ANTIGEN: Hepatitis B Surface Ag: NEGATIVE

## 2022-10-21 LAB — HSV 1 AND 2 AB, IGG
HSV 1 Glycoprotein G Ab, IgG: 23.6 index — ABNORMAL HIGH (ref 0.00–0.90)
HSV 2 IgG, Type Spec: 21 index — ABNORMAL HIGH (ref 0.00–0.90)

## 2022-10-21 LAB — VITAMIN D 25 HYDROXY (VIT D DEFICIENCY, FRACTURES): Vit D, 25-Hydroxy: 16.7 ng/mL — ABNORMAL LOW (ref 30.0–100.0)

## 2022-10-21 LAB — HEMOGLOBIN A1C
Est. average glucose Bld gHb Est-mCnc: 123 mg/dL
Hgb A1c MFr Bld: 5.9 % — ABNORMAL HIGH (ref 4.8–5.6)

## 2022-10-21 LAB — RPR: RPR Ser Ql: NONREACTIVE

## 2022-10-23 LAB — CT, NG, MYCOPLASMAS NAA, URINE
Chlamydia trachomatis, NAA: NEGATIVE
Mycoplasma genitalium NAA: NEGATIVE
Mycoplasma hominis NAA: NEGATIVE
Neisseria gonorrhoeae, NAA: NEGATIVE
Ureaplasma spp NAA: NEGATIVE

## 2022-10-26 MED ORDER — VITAMIN D3 1.25 MG (50000 UT) PO TABS: 1.0000 | ORAL_TABLET | ORAL | 1 refills | Status: DC

## 2022-10-26 NOTE — Addendum Note (Signed)
Addended by: Amelita Risinger, Clarise Cruz E on: 10/26/2022 06:53 PM   Modules accepted: Orders

## 2022-11-09 ENCOUNTER — Ambulatory Visit (INDEPENDENT_AMBULATORY_CARE_PROVIDER_SITE_OTHER): Payer: BC Managed Care – PPO | Admitting: Orthopaedic Surgery

## 2022-11-09 DIAGNOSIS — M1712 Unilateral primary osteoarthritis, left knee: Secondary | ICD-10-CM | POA: Diagnosis not present

## 2022-11-09 NOTE — Progress Notes (Signed)
Chief Complaint: Left knee pain     History of Present Illness:    James Bird is a 62 y.o. male presents today with ongoing left knee pain which has been significant for multiple months.  This has been worse over the last several weeks.  He has had a history of several injections the most recent of which was 2 months prior by Dr. Demetrios Isaacs.  This gave him no significant relief.  He has been undergoing physical therapy.  He is experiencing popping as well as crepitus in the joint.  He is here today for further assessment.  He does enjoy playing racquetball as well as going for runs and jogs although this has been limited as result of his knee pain    Surgical History:   None  PMH/PSH/Family History/Social History/Meds/Allergies:    Past Medical History:  Diagnosis Date   Allergy    RHINITS   Chronic shoulder pain    left   Constipation 2019   Diverticulosis    ED (erectile dysfunction)    Former smoker    quit late 2018   H/O echocardiogram 03/20/08   mild - moderate LVH, EF 50-55%, mild RV enlargement, mild mitral regurgitation, Dr. 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 Verlon Setting   History of neck surgery    Hyperlipidemia    Hypertension    Impaired fasting glucose    Past Surgical History:  Procedure Laterality Date   COLONOSCOPY  10/2010   diverticulosis, othewrise normal, repeat 10 years, Dr. Henrene Pastor   SPINE SURGERY  2011(JENKINS,MD)   CERVICAL DISC AND FUSION   Social History   Socioeconomic History   Marital status: Single    Spouse name: Not on file   Number of children: Not on file   Years of education: Not on file   Highest education level: Not on file  Occupational History   Occupation: Clinical biochemist    Employer: [employer]  Tobacco Use   Smoking status: Former    Packs/day: 0.50    Years: 16.00     Additional pack years: 0.00    Total pack years: 8.00    Types: Cigarettes    Quit date: 05/15/2017    Years since quitting: 5.4   Smokeless tobacco: Never  Vaping Use   Vaping Use: Never used  Substance and Sexual Activity   Alcohol use: Yes    Alcohol/week: 10.0 standard drinks of alcohol    Types: 10 Cans of beer per week    Comment: 10 drinks per week, sometimes more   Drug use: Not Currently    Comment: 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    02/2020   Social Determinants of Health   Financial Resource Strain: Not on file  Food Insecurity: Not on file  Transportation Needs: Not on file  Physical Activity: Not on file  Stress: Not on file  Social Connections: Not on file  Family History  Problem Relation Age of Onset   Cancer Mother        LUNG   Heart disease Father 33       MI   Cancer Paternal Aunt        type unknown   Cancer Paternal Uncle        type unknown   Hypertension Brother    Diabetes Neg Hx    Stroke Neg Hx    Colon cancer Neg Hx    Esophageal cancer Neg Hx    Pancreatic cancer Neg Hx    Stomach cancer Neg Hx    Rectal cancer Neg Hx    No Known Allergies Current Outpatient Medications  Medication Sig Dispense Refill   valACYclovir (VALTREX) 1000 MG tablet Take 1 tablet (1,000 mg total) by mouth 2 (two) times daily. (Patient not taking: Reported on 10/20/2022) 20 tablet 0   atorvastatin (LIPITOR) 20 MG tablet Take 1 tablet (20 mg total) by mouth daily. 90 tablet 3   chlorhexidine (PERIDEX) 0.12 % solution 15 mLs 2 (two) times daily.     Cholecalciferol (VITAMIN D3) 1.25 MG (50000 UT) TABS Take 1 tablet by mouth once a week. 12 tablet 1   linaclotide (LINZESS) 72 MCG capsule Take 1 capsule (72 mcg total) by  mouth daily before breakfast. (Patient not taking: Reported on 10/20/2022) 8 capsule 0   lisinopril-hydrochlorothiazide (ZESTORETIC) 20-12.5 MG tablet Take 1 tablet by mouth daily. 90 tablet 3   meloxicam (MOBIC) 15 MG tablet TAKE 1 TABLET(15 MG) BY MOUTH DAILY (Patient not taking: Reported on 10/20/2022) 30 tablet 0   predniSONE (STERAPRED UNI-PAK 21 TAB) 10 MG (21) TBPK tablet Take as per manufacturer's recommendation (Patient not taking: Reported on 08/24/2021) 21 tablet 0   tadalafil (CIALIS) 20 MG tablet Take 1 tablet (20 mg total) by mouth daily as needed for erectile dysfunction. (Patient not taking: Reported on 10/20/2022) 20 tablet 5   Current Facility-Administered Medications  Medication Dose Route Frequency Provider Last Rate Last Admin   0.9 %  sodium chloride infusion  500 mL Intravenous Once Irene Shipper, MD       No results found.  Review of Systems:   A ROS was performed including pertinent positives and negatives as documented in the HPI.  Physical Exam :   Constitutional: NAD and appears stated age Neurological: Alert and oriented Psych: Appropriate affect and cooperative There were no vitals taken for this visit.   Comprehensive Musculoskeletal Exam:    Left knee with tricompartmental pain and crepitus.  Range of motion is from 0 to 130 degrees.  There is no significant effusion.  Imaging:   Xray (4 views left knee): Advanced tricompartmental osteoarthritis   I personally reviewed and interpreted the radiographs.   Assessment:   62 y.o. male with left knee tricompartmental osteoarthritis.  Overall I did discuss treatment options.  He has had multiple steroid injections which are no longer providing relief.  He has also undergone physical therapy.  He is no longer getting relief from steroid so we did discuss additional injections including hyaluronic acid and PRP.  Given the severity of his arthritis I do not believe this would provide him prolonged relief.  He would  like to discuss possible knee arthroplasty at this time.  Will plan to refer him to Dr. Erlinda Hong for discussion of this  Plan :    -Plan to refer to Dr. Erlinda Hong for discussion of left knee arthroplasty     I  personally saw and evaluated the patient, and participated in the management and treatment plan.  Vanetta Mulders, MD Attending Physician, Orthopedic Surgery  This document was dictated using Dragon voice recognition software. A reasonable attempt at proof reading has been made to minimize errors.

## 2022-11-10 ENCOUNTER — Telehealth (HOSPITAL_BASED_OUTPATIENT_CLINIC_OR_DEPARTMENT_OTHER): Payer: Self-pay | Admitting: Orthopaedic Surgery

## 2022-11-10 NOTE — Telephone Encounter (Signed)
SCHeduled patient 4/17 with Dr. Sammuel Hines, so he can focus on appt with Dr. Erlinda Hong to discuss TKA

## 2022-11-10 NOTE — Telephone Encounter (Signed)
Patient wanted to discuss shoulder pain on his visit yesterday but forgot mention it and wanted to know if he should ask Dr Erlinda Hong or what would his next move be. [phone]

## 2022-11-11 ENCOUNTER — Other Ambulatory Visit: Payer: Self-pay | Admitting: Family Medicine

## 2022-11-11 DIAGNOSIS — I10 Essential (primary) hypertension: Secondary | ICD-10-CM

## 2022-11-21 NOTE — Progress Notes (Unsigned)
Office Visit Note   Patient: James Bird           Date of Birth: 1961-01-04           MRN: [id number] Visit Date: 11/22/2022              Requested by: Huel Cote, MD [address] [place],  [place] [address] PCP: Ronnald Nian, MD   Assessment & Plan: Visit Diagnoses:  1. Primary osteoarthritis of left knee     Plan: Impression is severe left knee degenerative joint disease secondary to Osteoarthritis.  Bone on bone joint space narrowing is seen on radiographs with mild varus alignment.  At this point, conservative treatments fail to provide any significant relief and the pain is severely affecting ADLs and quality of life.  Based on treatment options, the patient has elected to move forward with a knee replacement.  We have discussed the surgical risks that include but are not limited to infection, DVT, leg length discrepancy, stiffness, numbness, tingling, incomplete relief of pain.  Recovery and prognosis were also reviewed.    Eunice Blase will contact the patient to confirm surgery time within the next couple of months.  He will make arrangements for postop care with friends and family.  Total knee handout provided  Anticoagulants: No antithrombotic Postop anticoagulation: Aspirin 81 mg Diabetic: No  Nickel allergy: No Prior DVT/PE: No Tobacco use: No Clearances needed for surgery: None Anticipated discharge dispo: Home   Follow-Up Instructions: No follow-ups on file.   Orders:  No orders of the defined types were placed in this encounter.  No orders of the defined types were placed in this encounter.     Procedures: No procedures performed   Clinical Data: No additional findings.   Subjective: No chief complaint on file.   HPI  James Bird is a very pleasant 62 year old gentleman referral from Dr. Morey Hummingbird for surgical consultation for left knee DJD.  Patient has had significant interruption of quality of life and ADLs due to the symptoms.   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  He has had several cortisone injections over the years which now are ineffective.  He has nighttime pain.  Review of Systems  Constitutional: Negative.   HENT: Negative.    Eyes: Negative.   Respiratory: Negative.    Cardiovascular: Negative.   Gastrointestinal: Negative.   Endocrine: Negative.   Genitourinary: Negative.   Skin: Negative.   Allergic/Immunologic: Negative.   Neurological: Negative.   Hematological: Negative.   Psychiatric/Behavioral: Negative.    All other systems reviewed and are negative.   Objective: Vital Signs: There were no vitals taken for this visit.  Physical Exam Vitals and nursing note reviewed.  Constitutional:      Appearance: He is well-developed.  HENT:     Head: Normocephalic and atraumatic.  Eyes:     Pupils: Pupils are equal, round, and reactive to light.  Pulmonary:     Effort: Pulmonary effort is normal.  Abdominal:     Palpations: Abdomen is soft.  Musculoskeletal:        General: Normal range of motion.     Cervical back: Neck supple.  Skin:    General: Skin is warm.  Neurological:     Mental Status: He is alert and oriented to person, place, and time.  Psychiatric:        Behavior: Behavior normal.        Thought Content: Thought content normal.  Judgment: Judgment normal.   Ortho Exam  Examination left knee shows varus deformity.  Medial joint line tenderness.  Patellofemoral crepitus throughout range of motion.  Trace joint effusion.  Specialty Comments:  No specialty comments available.  Imaging: No results found.   PMFS History: Patient Active Problem List   Diagnosis Date Noted   History of smoking 10-25 pack years 08/24/2021   History of colonic polyps 08/24/2021   H/O cervical discectomy 08/24/2021   Constipation 04/16/2020   Family history of heart disease 02/21/2019   Paresthesia 05/24/2018   Elevated PSA 05/24/2018   Nail deformity 05/24/2018   Onychomycosis  05/24/2018   Tinea pedis 05/24/2018   Lumbar radiculopathy 04/25/2018   Osteoarthritis of left knee 03/29/2017   Clonus 03/29/2017   Right foot drop 03/29/2017   Routine general medical examination at a health care facility 12/14/2015   Obesity 12/11/2014   Hyperlipidemia 11/28/2012   Erectile dysfunction 11/28/2012   Essential hypertension, benign 11/02/2011   Impaired fasting blood sugar 11/02/2011   Past Medical History:  Diagnosis Date   Allergy    RHINITS   Chronic shoulder pain    left   Constipation 2019   Diverticulosis    ED (erectile dysfunction)    Former smoker    quit late 2018   H/O echocardiogram 03/20/08   mild - moderate LVH, EF 50-55%, mild RV enlargement, mild mitral regurgitation, Dr. 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 Reyes Ivan   History of neck surgery    Hyperlipidemia    Hypertension    Impaired fasting glucose     Family History  Problem Relation Age of Onset   Cancer Mother        LUNG   Heart disease Father 76       MI   Cancer Paternal Aunt        type unknown   Cancer Paternal Uncle        type unknown   Hypertension Brother    Diabetes Neg Hx    Stroke Neg Hx    Colon cancer Neg Hx    Esophageal cancer Neg Hx    Pancreatic cancer Neg Hx    Stomach cancer Neg Hx    Rectal cancer Neg Hx     Past Surgical History:  Procedure Laterality Date   COLONOSCOPY  10/2010   diverticulosis, othewrise normal, repeat 10 years, Dr. Marina Goodell   SPINE SURGERY  2011(JENKINS,MD)   CERVICAL DISC AND FUSION   Social History   Occupational History   Occupation: Therapist, nutritional: [employer]  Tobacco Use   Smoking status: Former    Packs/day: 0.50    Years: 16.00    Additional pack years: 0.00    Total pack years: 8.00    Types: Cigarettes    Quit date: 05/15/2017    Years since quitting: 5.5   Smokeless tobacco:  Never  Vaping Use   Vaping Use: Never used  Substance and Sexual Activity   Alcohol use: Yes    Alcohol/week: 10.0 standard drinks of alcohol    Types: 10 Cans of beer per week    Comment: 10 drinks per week, sometimes more   Drug use: Not Currently    Comment: 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

## 2022-11-22 ENCOUNTER — Other Ambulatory Visit (INDEPENDENT_AMBULATORY_CARE_PROVIDER_SITE_OTHER): Payer: BC Managed Care – PPO

## 2022-11-22 ENCOUNTER — Ambulatory Visit: Payer: BC Managed Care – PPO | Admitting: Orthopaedic Surgery

## 2022-11-22 DIAGNOSIS — M1712 Unilateral primary osteoarthritis, left knee: Secondary | ICD-10-CM

## 2022-11-23 ENCOUNTER — Other Ambulatory Visit: Payer: Self-pay | Admitting: Family Medicine

## 2022-11-23 DIAGNOSIS — E782 Mixed hyperlipidemia: Secondary | ICD-10-CM

## 2022-11-30 ENCOUNTER — Ambulatory Visit (INDEPENDENT_AMBULATORY_CARE_PROVIDER_SITE_OTHER): Payer: BC Managed Care – PPO | Admitting: Orthopaedic Surgery

## 2022-11-30 ENCOUNTER — Ambulatory Visit (INDEPENDENT_AMBULATORY_CARE_PROVIDER_SITE_OTHER): Payer: BC Managed Care – PPO

## 2022-11-30 DIAGNOSIS — M7582 Other shoulder lesions, left shoulder: Secondary | ICD-10-CM

## 2022-11-30 DIAGNOSIS — G8929 Other chronic pain: Secondary | ICD-10-CM

## 2022-11-30 DIAGNOSIS — M25512 Pain in left shoulder: Secondary | ICD-10-CM

## 2022-11-30 DIAGNOSIS — M19011 Primary osteoarthritis, right shoulder: Secondary | ICD-10-CM | POA: Diagnosis not present

## 2022-11-30 MED ORDER — LIDOCAINE HCL 1 % IJ SOLN
4.0000 mL | INTRAMUSCULAR | Status: AC | PRN
Start: 2022-11-30 — End: 2022-11-30
  Administered 2022-11-30: 4 mL

## 2022-11-30 MED ORDER — TRIAMCINOLONE ACETONIDE 40 MG/ML IJ SUSP
80.0000 mg | INTRAMUSCULAR | Status: AC | PRN
Start: 2022-11-30 — End: 2022-11-30
  Administered 2022-11-30: 80 mg via INTRA_ARTICULAR

## 2022-11-30 NOTE — Progress Notes (Signed)
Chief Complaint: Bilateral shoulder pain     History of Present Illness:   11/30/2022: Presents today for ongoing right worse than left shoulder pain.  He is here today for further assessment.  He is doing quite well from a knee perspective following his injection.  He states that the right shoulder he is experiencing limiting motion with the left shoulder he does experience probably pain with overhead activity.  James Bird is a 62 y.o. male presents today with ongoing left knee pain which has been significant for multiple months.  This has been worse over the last several weeks.  He has had a history of several injections the most recent of which was 2 months prior by Dr. Lindie Spruce.  This gave him no significant relief.  He has been undergoing physical therapy.  He is experiencing popping as well as crepitus in the joint.  He is here today for further assessment.  He does enjoy playing racquetball as well as going for runs and jogs although this has been limited as result of his knee pain    Surgical History:   None  PMH/PSH/Family History/Social History/Meds/Allergies:    Past Medical History:  Diagnosis Date  . Allergy    RHINITS  . Chronic shoulder pain    left  . Constipation 2019  . Diverticulosis   . ED (erectile dysfunction)   . Former smoker    quit late 2018  . H/O echocardiogram 03/20/08   mild - moderate LVH, EF 50-55%, mild RV enlargement, mild mitral regurgitation, Dr. Reyes Ivan  . 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 History of cardiovascular stress test 03/20/2008   dobutamine stress test, normal study, Dr. Reyes Ivan  . History of neck surgery   . Hyperlipidemia   . Hypertension   . Impaired fasting glucose    Past Surgical History:  Procedure Laterality Date  . COLONOSCOPY  10/2010   diverticulosis, othewrise normal, repeat 10 years, Dr. Marina Goodell  . SPINE SURGERY  2011(JENKINS,MD)   CERVICAL DISC AND FUSION   Social  History   Socioeconomic History  . Marital status: Single    Spouse name: Not on file  . Number of children: Not on file  . Years of education: Not on file  . Highest education level: Not on file  Occupational History  . Occupation: Therapist, nutritional: [employer]  Tobacco Use  . Smoking status: Former    Packs/day: 0.50    Years: 16.00    Additional pack years: 0.00    Total pack years: 8.00    Types: Cigarettes    Quit date: 05/15/2017    Years since quitting: 5.5  . Smokeless tobacco: Never  Vaping Use  . Vaping Use: Never used  Substance and Sexual Activity  . Alcohol use: Yes    Alcohol/week: 10.0 standard drinks of alcohol    Types: 10 Cans of beer per week    Comment: 10 drinks per week, sometimes more  . 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 Sexual activity: Not on file  Other Topics Concern  . 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 Cancer Mother        LUNG  . Heart disease Father 24       MI  . Cancer Paternal Aunt        type unknown  . Cancer Paternal Uncle        type unknown  . Hypertension Brother   . Diabetes Neg Hx   . Stroke Neg Hx   . Colon cancer Neg Hx   . Esophageal cancer Neg Hx   . Pancreatic cancer Neg Hx   . Stomach cancer Neg Hx   . Rectal cancer Neg Hx    No Known Allergies Current Outpatient Medications  Medication Sig Dispense Refill  . valACYclovir (VALTREX) 1000 MG tablet Take 1 tablet (1,000 mg total) by mouth 2 (two) times daily.  (Patient not taking: Reported on 10/20/2022) 20 tablet 0  . atorvastatin (LIPITOR) 20 MG tablet TAKE 1 TABLET(20 MG) BY MOUTH DAILY 90 tablet 1  . chlorhexidine (PERIDEX) 0.12 % solution 15 mLs 2 (two) times daily.    . Cholecalciferol (VITAMIN D3) 1.25 MG (50000 UT) TABS Take 1 tablet by mouth once a week. 12 tablet 1  . linaclotide (LINZESS) 72 MCG capsule Take 1 capsule (72 mcg total) by mouth daily before breakfast. (Patient not taking: Reported on 10/20/2022) 8 capsule 0  . lisinopril-hydrochlorothiazide (ZESTORETIC) 20-12.5 MG tablet TAKE 1 TABLET BY MOUTH DAILY 90 tablet 3  . meloxicam (MOBIC) 15 MG tablet TAKE 1 TABLET(15 MG) BY MOUTH DAILY (Patient not taking: Reported on 10/20/2022) 30 tablet 0  . predniSONE (STERAPRED UNI-PAK 21 TAB) 10 MG (21) TBPK tablet Take as per manufacturer's recommendation (Patient not taking: Reported on 08/24/2021) 21 tablet 0  . tadalafil (CIALIS) 20 MG tablet Take 1 tablet (20 mg total) by mouth daily as needed for erectile dysfunction. (Patient not taking: Reported on 10/20/2022) 20 tablet 5   Current Facility-Administered Medications  Medication Dose Route Frequency Provider Last Rate Last Admin  . 0.9 %  sodium chloride infusion  500 mL Intravenous Once Hilarie Fredrickson, MD       No results found.  Review of Systems:   A ROS was performed including pertinent positives and negatives as documented in the HPI.  Physical Exam :   Constitutional: NAD and appears stated age Neurological: Alert and oriented Psych: Appropriate affect and cooperative There were no vitals taken for this visit.   Comprehensive Musculoskeletal Exam:    Right shoulder with forward elevation to 160 degrees compared to 170 degrees on the left.  External rotation at the side is 45 degrees bilaterally.  Internal rotation on the right is to L3 compared to T12 on the left.  Negative belly press full strength although there is pain with forward elevation and a positive Neer  impingement  Imaging:   Xray (3 views right shoulder, 3 views left shoulder): Right shoulder mild osteoarthritis involving glenohumeral joint, negative left shoulder x-ray   I personally reviewed and interpreted the radiographs.   Assessment:   62 y.o. male with with right shoulder osteoarthritis as well as likely left shoulder subacromial impingement.  I did offer him initial treatment including injections versus physical therapy.  At today's visit he would like bilateral shoulder injections.  I will plan to see him back on an as-needed basis Plan :    -Right shoulder glenohumeral injection and left shoulder subacromial injection provided verbal consent.     Procedure Note  Patient: James Bird             Date of Birth: Dec 08, 1960           MRN: [id number]             Visit Date: 11/30/2022  Procedures: Visit Diagnoses:  1. Chronic left shoulder pain   2. Rotator cuff tendinitis, left   3. Osteoarthritis of right shoulder, unspecified osteoarthritis type     Large Joint Inj: R glenohumeral on 11/30/2022 3:25 PM Indications: pain Details: 22 G 1.5 in needle, ultrasound-guided anterior approach  Arthrogram: No  Medications: 4 mL lidocaine 1 %; 80 mg triamcinolone acetonide 40 MG/ML Outcome: tolerated well, no immediate complications Procedure, treatment alternatives, risks and benefits explained, specific risks discussed. Consent was given by the patient. Immediately prior to procedure a time out was called to verify the correct patient, procedure, equipment, support staff and site/side marked as required. Patient was prepped and draped in the usual sterile fashion.    Large Joint Inj: L subacromial bursa on 11/30/2022 3:25 PM Indications: pain Details: 22 G 1.5 in needle, ultrasound-guided anterior approach  Arthrogram: No  Medications: 4 mL lidocaine 1 %; 80 mg triamcinolone acetonide 40 MG/ML Outcome: tolerated well, no immediate complications Procedure,  treatment alternatives, risks and benefits explained, specific risks discussed. Consent was given by the patient. Immediately prior to procedure a time out was called to verify the correct patient, procedure, equipment, support staff and site/side marked as required. Patient was prepped and draped in the usual sterile fashion.       I personally saw and evaluated the patient, and participated in the management and treatment plan.  Huel Cote, MD Attending Physician, Orthopedic Surgery  This document was dictated using Dragon voice recognition software. A reasonable attempt at proof reading has been made to minimize errors.

## 2022-12-06 ENCOUNTER — Encounter: Payer: Self-pay | Admitting: Orthopaedic Surgery

## 2023-01-12 ENCOUNTER — Ambulatory Visit: Payer: BC Managed Care – PPO

## 2023-01-12 DIAGNOSIS — M1712 Unilateral primary osteoarthritis, left knee: Secondary | ICD-10-CM

## 2023-01-13 LAB — HEMOGLOBIN A1C
Hgb A1c MFr Bld: 6.3 % of total Hgb — ABNORMAL HIGH (ref <5.7)
Mean Plasma Glucose: 134 mg/dL
eAG (mmol/L): 7.4 mmol/L

## 2023-01-13 LAB — PREALBUMIN: Prealbumin: 28 mg/dL (ref 21–43)

## 2023-01-16 NOTE — Pre-Procedure Instructions (Signed)
Surgical Instructions    Your procedure is scheduled on January 23, 2023.  Report to [facility] Main Entrance "A" at 9:30 A.M., then check in with the Admitting office.  Call this number if you have problems the morning of surgery:  [phone]  If you have any questions prior to your surgery date call [phone]: Open Monday-Friday 8am-4pm If you experience any cold or flu symptoms such as cough, fever, chills, shortness of breath, etc. between now and your scheduled surgery, please notify us at the above number.     Remember:  Do not eat after midnight the night before your surgery  You may drink clear liquids until 9:00 AM the morning of your surgery.   Clear liquids allowed are: Water, Non-Citrus Juices (without pulp), Carbonated Beverages, Clear Tea, Black Coffee Only (NO MILK, CREAM OR POWDERED CREAMER of any kind), and Gatorade.  Patient Instructions  The night before surgery:  No food after midnight. ONLY clear liquids after midnight  The day of surgery (if you do NOT have diabetes):  Drink ONE (1) Pre-Surgery Clear Ensure by 9:00 AM the morning of surgery. Drink in one sitting. Do not sip.  This drink was given to you during your hospital  pre-op appointment visit.  Nothing else to drink after completing the  Pre-Surgery Clear Ensure.         If you have questions, please contact your surgeon's office.      Take these medicines the morning of surgery with A SIP OF WATER:  atorvastatin (LIPITOR)   linaclotide (LINZESS) - may take if needed  valACYclovir (VALTREX) - may take if needed     As of today, STOP taking any Aspirin (unless otherwise instructed by your surgeon) Aleve, Naproxen, Ibuprofen, Motrin, Advil, Goody's, BC's, all herbal medications, fish oil, and all vitamins.                     Do NOT Smoke (Tobacco/Vaping) for 24 hours prior to your procedure.  If you use a CPAP at night, you may bring your mask/headgear for your overnight stay.   Contacts,  glasses, piercing's, hearing aid's, dentures or partials may not be worn into surgery, please bring cases for these belongings.    For patients admitted to the hospital, discharge time will be determined by your treatment team.   Patients discharged the day of surgery will not be allowed to drive home, and someone needs to stay with them for 24 hours.  SURGICAL WAITING ROOM VISITATION Patients having surgery or a procedure may have no more than 2 support people in the waiting area - these visitors may rotate.   Children under the age of 27 must have an adult with them who is not the patient. If the patient needs to stay at the hospital during part of their recovery, the visitor guidelines for inpatient rooms apply. Pre-op nurse will coordinate an appropriate time for 1 support person to accompany patient in pre-op.  This support person may not rotate.   Please refer to the [facility] website for the visitor guidelines for Inpatients (after your surgery is over and you are in a regular room).    If you received a COVID test during your pre-op visit  it is requested that you wear a mask when out in public, stay away from anyone that may not be feeling well and notify your surgeon if you develop symptoms. If you have been in contact with anyone that has tested positive in  the last 10 days please notify you surgeon.    Pre-operative 5 CHG Bath Instructions   You can play a key role in reducing the risk of infection after surgery. Your skin needs to be as free of germs as possible. You can reduce the number of germs on your skin by washing with CHG (chlorhexidine gluconate) soap before surgery. CHG is an antiseptic soap that kills germs and continues to kill germs even after washing.   DO NOT use if you have an allergy to chlorhexidine/CHG or antibacterial soaps. If your skin becomes reddened or irritated, stop using the CHG and notify one of our RNs at [phone].   Please shower with the CHG  soap starting 4 days before surgery using the following schedule:     Please keep in mind the following:  DO NOT shave, including legs and underarms, starting the day of your first shower.   You may shave your face at any point before/day of surgery.  Place clean sheets on your bed the day you start using CHG soap. Use a clean washcloth (not used since being washed) for each shower. DO NOT sleep with pets once you start using the CHG.   CHG Shower Instructions:  If you choose to wash your hair and private area, wash first with your normal shampoo/soap.  After you use shampoo/soap, rinse your hair and body thoroughly to remove shampoo/soap residue.  Turn the water OFF and apply about 3 tablespoons (45 ml) of CHG soap to a CLEAN washcloth.  Apply CHG soap ONLY FROM YOUR NECK DOWN TO YOUR TOES (washing for 3-5 minutes)  DO NOT use CHG soap on face, private areas, open wounds, or sores.  Pay special attention to the area where your surgery is being performed.  If you are having back surgery, having someone wash your back for you may be helpful. Wait 2 minutes after CHG soap is applied, then you may rinse off the CHG soap.  Pat dry with a clean towel  Put on clean clothes/pajamas   If you choose to wear lotion, please use ONLY the CHG-compatible lotions on the back of this paper.     Additional instructions for the day of surgery: DO NOT APPLY any lotions, deodorants, cologne, or perfumes.   Do not wear jewelry or makeup Do not wear nail polish, gel polish, artificial nails, or any other type of covering on natural nails (fingers and toes) Do not bring valuables to the hospital. [facility] is not responsible for any belongings or valuables. Put on clean/comfortable clothes.  Brush your teeth.  Ask your nurse before applying any prescription medications to the skin.      CHG Compatible Lotions   Aveeno Moisturizing lotion  Cetaphil Moisturizing Cream  Cetaphil Moisturizing  Lotion  Clairol Herbal Essence Moisturizing Lotion, Dry Skin  Clairol Herbal Essence Moisturizing Lotion, Extra Dry Skin  Clairol Herbal Essence Moisturizing Lotion, Normal Skin  Curel Age Defying Therapeutic Moisturizing Lotion with Alpha Hydroxy  Curel Extreme Care Body Lotion  Curel Soothing Hands Moisturizing Hand Lotion  Curel Therapeutic Moisturizing Cream, Fragrance-Free  Curel Therapeutic Moisturizing Lotion, Fragrance-Free  Curel Therapeutic Moisturizing Lotion, Original Formula  Eucerin Daily Replenishing Lotion  Eucerin Dry Skin Therapy Plus Alpha Hydroxy Crme  Eucerin Dry Skin Therapy Plus Alpha Hydroxy Lotion  Eucerin Original Crme  Eucerin Original Lotion  Eucerin Plus Crme Eucerin Plus Lotion  Eucerin TriLipid Replenishing Lotion  Keri Anti-Bacterial Hand Lotion  Keri Deep Conditioning Original Lotion Dry Skin  Formula Softly Scented  Keri Deep Conditioning Original Lotion, Fragrance Free Sensitive Skin Formula  Keri Lotion Fast Absorbing Fragrance Free Sensitive Skin Formula  Keri Lotion Fast Absorbing Softly Scented Dry Skin Formula  Keri Original Lotion  Keri Skin Renewal Lotion Keri Silky Smooth Lotion  Keri Silky Smooth Sensitive Skin Lotion  Nivea Body Creamy Conditioning Oil  Nivea Body Extra Enriched Lotion  Nivea Body Original Lotion  Nivea Body Sheer Moisturizing Lotion Nivea Crme  Nivea Skin Firming Lotion  NutraDerm 30 Skin Lotion  NutraDerm Skin Lotion  NutraDerm Therapeutic Skin Cream  NutraDerm Therapeutic Skin Lotion  ProShield Protective Hand Cream  Provon moisturizing lotion    Please read over the following fact sheets that you were given.

## 2023-01-17 ENCOUNTER — Inpatient Hospital Stay (HOSPITAL_COMMUNITY)
Admission: RE | Admit: 2023-01-17 | Discharge: 2023-01-17 | Disposition: A | Discharge status: Home / Self Care | Payer: BC Managed Care – PPO | Source: Ambulatory Visit

## 2023-01-17 ENCOUNTER — Other Ambulatory Visit: Payer: Self-pay | Admitting: Physician Assistant

## 2023-01-17 ENCOUNTER — Other Ambulatory Visit: Payer: Self-pay

## 2023-01-17 DIAGNOSIS — F17210 Nicotine dependence, cigarettes, uncomplicated: Secondary | ICD-10-CM

## 2023-01-17 DIAGNOSIS — Z87891 Personal history of nicotine dependence: Secondary | ICD-10-CM

## 2023-01-17 MED ORDER — ASPIRIN 81 MG PO TBEC: 81.0000 mg | DELAYED_RELEASE_TABLET | Freq: Two times a day (BID) | ORAL | 0 refills | Status: DC

## 2023-01-17 MED ORDER — ONDANSETRON HCL 4 MG PO TABS: 4.0000 mg | ORAL_TABLET | Freq: Three times a day (TID) | ORAL | 0 refills | Status: DC | PRN

## 2023-01-17 MED ORDER — DOCUSATE SODIUM 100 MG PO CAPS
100.0000 mg | ORAL_CAPSULE | Freq: Every day | ORAL | 2 refills | Status: DC | PRN
Start: 2023-01-17 — End: 2023-09-08

## 2023-01-17 MED ORDER — OXYCODONE-ACETAMINOPHEN 5-325 MG PO TABS: 1.0000 | ORAL_TABLET | Freq: Four times a day (QID) | ORAL | 0 refills | Status: DC | PRN

## 2023-01-17 MED ORDER — METHOCARBAMOL 750 MG PO TABS: 750.0000 mg | ORAL_TABLET | Freq: Two times a day (BID) | ORAL | 2 refills | Status: DC | PRN

## 2023-01-17 NOTE — Progress Notes (Signed)
     Your surgery and Pre-Admission testing visit will be at [facility] located at [address], [place], [place] [address].  Please let all your doctors (i.e., Primary Care Physician, Cardiologist, Endocrinologist, Pulmonologist) know you are having surgery. You may need clearance for surgery. If you are on blood thinners, notify your surgeon and ask the doctor who prescribed them how long to hold them before surgery.  If you have had a heart test, such as an EKG, stress test, heart ultrasound, etc., or lab work performed outside of [place], please bring copies of these tests to your Pre-Admission testing, if possible.  These departments may contact you before the day of surgery:  Pre-Service Center - insurance/ billing: [phone] Pharmacy- to review your medications: [phone] Pre-Admission Testing- to set an appointment for your visit: [phone]  (Often, these numbers show up as "SPAM" on your phone)  The Pre-Admission Testing (PAT) visit focuses on Anesthesia for your upcoming surgery.  You do NOT need to fast; take your medications as usual. Please arrive 30 minutes early to allow for parking and admitting.  The visit may last up to an hour. Bring a photo ID and medical insurance card. Reschedule if you are sick. ([phone]) and please, NO children under age 16 at the visit.  During the PAT visit:  We will review your medical and surgical history.   You will receive pre-operative instructions, including the time of arrival at the hospital and surgical start time.  We will review what medication(s) you can take on the day of surgery.  After speaking with the nurse, you will have blood drawn and, if needed, a chest x-ray and EKG.  Most lab results from your doctor are good for 30 days, Hemoglobin A1C is good for 60 days. If you cannot talk to the Pharmacy, bring your medications or a list of them to the PST visit.   Infection control for the [facility] requires: All fingernail and toenail products should be removed before the day of surgery.  (SNS, Acrylic, Gel, Polish, Stickers, Press on, and Poly gel nails.)   Parking information:  Address: [facility] - [address], , [place] [address]  Please look for signs for entrance A off of [address]. Free valet parking is available Monday-Friday 05:30am-06:00pm     

## 2023-01-20 ENCOUNTER — Encounter (HOSPITAL_COMMUNITY): Payer: Self-pay

## 2023-01-20 ENCOUNTER — Other Ambulatory Visit: Payer: Self-pay

## 2023-01-20 ENCOUNTER — Encounter (HOSPITAL_COMMUNITY)
Admission: RE | Admit: 2023-01-20 | Discharge: 2023-01-20 | Disposition: A | Discharge status: Home / Self Care | Payer: BC Managed Care – PPO | Source: Ambulatory Visit | Attending: Orthopaedic Surgery | Admitting: Orthopaedic Surgery

## 2023-01-20 VITALS — BP 125/79 | HR 82 | Temp 98.9°F | Resp 18 | Ht 71.0 in | Wt 225.8 lb

## 2023-01-20 DIAGNOSIS — Z01818 Encounter for other preprocedural examination: Secondary | ICD-10-CM | POA: Diagnosis present

## 2023-01-20 DIAGNOSIS — I498 Other specified cardiac arrhythmias: Secondary | ICD-10-CM | POA: Diagnosis not present

## 2023-01-20 DIAGNOSIS — I10 Essential (primary) hypertension: Secondary | ICD-10-CM | POA: Diagnosis not present

## 2023-01-20 DIAGNOSIS — M1712 Unilateral primary osteoarthritis, left knee: Secondary | ICD-10-CM | POA: Diagnosis not present

## 2023-01-20 LAB — CBC
HCT: 43.1 % (ref 39.0–52.0)
Hemoglobin: 14.5 g/dL (ref 13.0–17.0)
MCH: 29.4 pg (ref 26.0–34.0)
MCHC: 33.6 g/dL (ref 30.0–36.0)
MCV: 87.2 fL (ref 80.0–100.0)
Platelets: 241 10*3/uL (ref 150–400)
RBC: 4.94 MIL/uL (ref 4.22–5.81)
RDW: 15 % (ref 11.5–15.5)
WBC: 8.2 10*3/uL (ref 4.0–10.5)
nRBC: 0 % (ref 0.0–0.2)

## 2023-01-20 LAB — BASIC METABOLIC PANEL
Anion gap: 9 (ref 5–15)
BUN: 15 mg/dL (ref 8–23)
CO2: 25 mmol/L (ref 22–32)
Calcium: 9.4 mg/dL (ref 8.9–10.3)
Chloride: 107 mmol/L (ref 98–111)
Creatinine, Ser: 0.94 mg/dL (ref 0.61–1.24)
GFR, Estimated: >60 mL/min (ref >60)
Glucose, Bld: 97 mg/dL (ref 70–99)
Potassium: 3.4 mmol/L — ABNORMAL LOW (ref 3.5–5.1)
Sodium: 141 mmol/L (ref 135–145)

## 2023-01-20 LAB — SURGICAL PCR SCREEN

## 2023-01-20 MED ORDER — TRANEXAMIC ACID 1000 MG/10ML IV SOLN
2000.0000 mg | INTRAVENOUS | Status: DC
  Filled 2023-01-20 (×2): qty 20

## 2023-01-20 NOTE — Pre-Procedure Instructions (Signed)
Surgical Instructions    Your procedure is scheduled on January 23, 2023.  Report to [facility] Main Entrance "A" at 7:45 A.M., then check in with the Admitting office.  Call this number if you have problems the morning of surgery:  [phone]  If you have any questions prior to your surgery date call [phone]: Open Monday-Friday 8am-4pm If you experience any cold or flu symptoms such as cough, fever, chills, shortness of breath, etc. between now and your scheduled surgery, please notify us at the above number.     Remember:  Do not eat after midnight the night before your surgery  You may drink clear liquids until 7:15AM the morning of your surgery.   Clear liquids allowed are: Water, Non-Citrus Juices (without pulp), Carbonated Beverages, Clear Tea, Black Coffee Only (NO MILK, CREAM OR POWDERED CREAMER of any kind), and Gatorade.  Patient Instructions  The night before surgery:  No food after midnight. ONLY clear liquids after midnight  The day of surgery (if you do NOT have diabetes):  Drink ONE (1) Pre-Surgery Clear Ensure by 7:15 AM the morning of surgery. Drink in one sitting. Do not sip.  This drink was given to you during your hospital  pre-op appointment visit.  Nothing else to drink after completing the  Pre-Surgery Clear Ensure.         If you have questions, please contact your surgeon's office.      Take these medicines the morning of surgery with A SIP OF WATER:  atorvastatin (LIPITOR)   linaclotide (LINZESS) - may take if needed  valACYclovir (VALTREX) - may take if needed     As of today, STOP taking any Aspirin (unless otherwise instructed by your surgeon) Aleve, Naproxen, Ibuprofen, Motrin, Advil, Goody's, BC's, all herbal medications, fish oil, and all vitamins.                     Do NOT Smoke (Tobacco/Vaping) for 24 hours prior to your procedure.  If you use a CPAP at night, you may bring your mask/headgear for your overnight stay.   Contacts,  glasses, piercing's, hearing aid's, dentures or partials may not be worn into surgery, please bring cases for these belongings.    For patients admitted to the hospital, discharge time will be determined by your treatment team.   Patients discharged the day of surgery will not be allowed to drive home, and someone needs to stay with them for 24 hours.  SURGICAL WAITING ROOM VISITATION Patients having surgery or a procedure may have no more than 2 support people in the waiting area - these visitors may rotate.   Children under the age of 29 must have an adult with them who is not the patient. If the patient needs to stay at the hospital during part of their recovery, the visitor guidelines for inpatient rooms apply. Pre-op nurse will coordinate an appropriate time for 1 support person to accompany patient in pre-op.  This support person may not rotate.   Please refer to the [facility] website for the visitor guidelines for Inpatients (after your surgery is over and you are in a regular room).    If you received a COVID test during your pre-op visit  it is requested that you wear a mask when out in public, stay away from anyone that may not be feeling well and notify your surgeon if you develop symptoms. If you have been in contact with anyone that has tested positive in the  last 10 days please notify you surgeon.    Pre-operative 5 CHG Bath Instructions   You can play a key role in reducing the risk of infection after surgery. Your skin needs to be as free of germs as possible. You can reduce the number of germs on your skin by washing with CHG (chlorhexidine gluconate) soap before surgery. CHG is an antiseptic soap that kills germs and continues to kill germs even after washing.   DO NOT use if you have an allergy to chlorhexidine/CHG or antibacterial soaps. If your skin becomes reddened or irritated, stop using the CHG and notify one of our RNs at [phone].   Please shower with the CHG  soap starting 4 days before surgery using the following schedule:     Please keep in mind the following:  DO NOT shave, including legs and underarms, starting the day of your first shower.   You may shave your face at any point before/day of surgery.  Place clean sheets on your bed the day you start using CHG soap. Use a clean washcloth (not used since being washed) for each shower. DO NOT sleep with pets once you start using the CHG.   CHG Shower Instructions:  If you choose to wash your hair and private area, wash first with your normal shampoo/soap.  After you use shampoo/soap, rinse your hair and body thoroughly to remove shampoo/soap residue.  Turn the water OFF and apply about 3 tablespoons (45 ml) of CHG soap to a CLEAN washcloth.  Apply CHG soap ONLY FROM YOUR NECK DOWN TO YOUR TOES (washing for 3-5 minutes)  DO NOT use CHG soap on face, private areas, open wounds, or sores.  Pay special attention to the area where your surgery is being performed.  If you are having back surgery, having someone wash your back for you may be helpful. Wait 2 minutes after CHG soap is applied, then you may rinse off the CHG soap.  Pat dry with a clean towel  Put on clean clothes/pajamas   If you choose to wear lotion, please use ONLY the CHG-compatible lotions on the back of this paper.     Additional instructions for the day of surgery: DO NOT APPLY any lotions, deodorants, cologne, or perfumes.   Do not wear jewelry or makeup Do not wear nail polish, gel polish, artificial nails, or any other type of covering on natural nails (fingers and toes) Do not bring valuables to the hospital. [facility] is not responsible for any belongings or valuables. Put on clean/comfortable clothes.  Brush your teeth.  Ask your nurse before applying any prescription medications to the skin.      CHG Compatible Lotions   Aveeno Moisturizing lotion  Cetaphil Moisturizing Cream  Cetaphil Moisturizing  Lotion  Clairol Herbal Essence Moisturizing Lotion, Dry Skin  Clairol Herbal Essence Moisturizing Lotion, Extra Dry Skin  Clairol Herbal Essence Moisturizing Lotion, Normal Skin  Curel Age Defying Therapeutic Moisturizing Lotion with Alpha Hydroxy  Curel Extreme Care Body Lotion  Curel Soothing Hands Moisturizing Hand Lotion  Curel Therapeutic Moisturizing Cream, Fragrance-Free  Curel Therapeutic Moisturizing Lotion, Fragrance-Free  Curel Therapeutic Moisturizing Lotion, Original Formula  Eucerin Daily Replenishing Lotion  Eucerin Dry Skin Therapy Plus Alpha Hydroxy Crme  Eucerin Dry Skin Therapy Plus Alpha Hydroxy Lotion  Eucerin Original Crme  Eucerin Original Lotion  Eucerin Plus Crme Eucerin Plus Lotion  Eucerin TriLipid Replenishing Lotion  Keri Anti-Bacterial Hand Lotion  Keri Deep Conditioning Original Lotion Dry Skin Formula  Softly Scented  Keri Deep Conditioning Original Lotion, Fragrance Free Sensitive Skin Formula  Keri Lotion Fast Absorbing Fragrance Free Sensitive Skin Formula  Keri Lotion Fast Absorbing Softly Scented Dry Skin Formula  Keri Original Lotion  Keri Skin Renewal Lotion Keri Silky Smooth Lotion  Keri Silky Smooth Sensitive Skin Lotion  Nivea Body Creamy Conditioning Oil  Nivea Body Extra Enriched Lotion  Nivea Body Original Lotion  Nivea Body Sheer Moisturizing Lotion Nivea Crme  Nivea Skin Firming Lotion  NutraDerm 30 Skin Lotion  NutraDerm Skin Lotion  NutraDerm Therapeutic Skin Cream  NutraDerm Therapeutic Skin Lotion  ProShield Protective Hand Cream  Provon moisturizing lotion    Please read over the following fact sheets that you were given.

## 2023-01-20 NOTE — Progress Notes (Addendum)
PCP - Sharlot Gowda Cardiologist - denies  PPM/ICD - denies   Chest x-ray - n/a EKG - 01/20/23 Stress Test - 2009 ECHO - 2009 Cardiac Cath - denies  Sleep Study - denies  ERAS Protcol -yes PRE-SURGERY Ensure or G2- ensure ordered and given  COVID TEST- not needed   Anesthesia review: no  Patient denies shortness of breath, fever, cough and chest pain at PAT appointment   All instructions explained to the patient, with a verbal understanding of the material. Patient agrees to go over the instructions while at home for a better understanding. Patient also instructed to self quarantine after being tested for COVID-19. The opportunity to ask questions was provided.

## 2023-01-22 NOTE — Anesthesia Preprocedure Evaluation (Signed)
Anesthesia Evaluation  Patient identified by MRN, date of birth, ID band Patient awake    Reviewed: Allergy & Precautions, NPO status , Patient's Chart, lab work & pertinent test results  History of Anesthesia Complications Negative for: history of anesthetic complications  Airway Mallampati: III  TM Distance: >3 FB Neck ROM: Full    Dental  (+) Dental Advisory Given   Pulmonary neg shortness of breath, neg sleep apnea, neg COPD, neg recent URI, former smoker   Pulmonary exam normal breath sounds clear to auscultation       Cardiovascular hypertension (lisinopril-HCTZ), Pt. on medications (-) angina (-) Past MI, (-) Cardiac Stents and (-) CABG (-) dysrhythmias  Rhythm:Regular Rate:Normal  HLD   Neuro/Psych neg Seizures  Neuromuscular disease (lumbar radiculopathy, h/o cervical discectomy, right foot drop)    GI/Hepatic Neg liver ROS,neg GERD  ,,diverticulosis   Endo/Other  negative endocrine ROS    Renal/GU negative Renal ROS     Musculoskeletal  (+) Arthritis , Osteoarthritis,    Abdominal  (+) + obese  Peds  Hematology negative hematology ROS (+)   Anesthesia Other Findings Platelets 241 01/20/2023  Reproductive/Obstetrics                             Anesthesia Physical Anesthesia Plan  ASA: 2  Anesthesia Plan: MAC and Spinal   Post-op Pain Management: Regional block* and Tylenol PO (pre-op)*   Induction: Intravenous  PONV Risk Score and Plan: 1 and Ondansetron, Dexamethasone, Propofol infusion and Treatment may vary due to age or medical condition  Airway Management Planned: Natural Airway and Simple Face Mask  Additional Equipment:   Intra-op Plan:   Post-operative Plan:   Informed Consent: I have reviewed the patients History and Physical, chart, labs and discussed the procedure including the risks, benefits and alternatives for the proposed anesthesia with the patient or  authorized representative who has indicated his/her understanding and acceptance.     Dental advisory given  Plan Discussed with: CRNA and Anesthesiologist  Anesthesia Plan Comments: (Discussed potential risks of nerve blocks including, but not limited to, infection, bleeding, nerve damage, seizures, pneumothorax, respiratory depression, and potential failure of the block. Alternatives to nerve blocks discussed. All questions answered.  I have discussed risks of neuraxial anesthesia including but not limited to infection, bleeding, nerve injury, back pain, headache, seizures, and failure of block. Patient denies bleeding disorders and is not currently anticoagulated. Labs have been reviewed. Risks and benefits discussed. All patient's questions answered.   Discussed with patient risks of MAC including, but not limited to, minor pain or discomfort, hearing people in the room, and possible need for backup general anesthesia. Risks for general anesthesia also discussed including, but not limited to, sore throat, hoarse voice, chipped/damaged teeth, injury to vocal cords, nausea and vomiting, allergic reactions, lung infection, heart attack, stroke, and death. All questions answered. )       Anesthesia Quick Evaluation

## 2023-01-23 ENCOUNTER — Observation Stay (HOSPITAL_COMMUNITY): Payer: BC Managed Care – PPO

## 2023-01-23 ENCOUNTER — Other Ambulatory Visit: Payer: Self-pay

## 2023-01-23 ENCOUNTER — Observation Stay (HOSPITAL_COMMUNITY)
Admission: RE | Admit: 2023-01-23 | Discharge: 2023-01-24 | Disposition: A | Discharge status: Home Health | Payer: BC Managed Care – PPO | Attending: Orthopaedic Surgery | Admitting: Orthopaedic Surgery

## 2023-01-23 ENCOUNTER — Encounter (HOSPITAL_COMMUNITY)
Admission: RE | Disposition: A | Discharge status: Home Health | Payer: Self-pay | Source: Home / Self Care | Attending: Orthopaedic Surgery

## 2023-01-23 ENCOUNTER — Ambulatory Visit (HOSPITAL_COMMUNITY): Payer: BC Managed Care – PPO | Admitting: Physician Assistant

## 2023-01-23 ENCOUNTER — Ambulatory Visit (HOSPITAL_COMMUNITY): Payer: BC Managed Care – PPO | Admitting: Anesthesiology

## 2023-01-23 ENCOUNTER — Encounter (HOSPITAL_COMMUNITY): Payer: Self-pay | Admitting: Orthopaedic Surgery

## 2023-01-23 DIAGNOSIS — M1712 Unilateral primary osteoarthritis, left knee: Principal | ICD-10-CM | POA: Insufficient documentation

## 2023-01-23 DIAGNOSIS — Z79899 Other long term (current) drug therapy: Secondary | ICD-10-CM | POA: Diagnosis not present

## 2023-01-23 DIAGNOSIS — Z87891 Personal history of nicotine dependence: Secondary | ICD-10-CM | POA: Insufficient documentation

## 2023-01-23 DIAGNOSIS — Z96652 Presence of left artificial knee joint: Secondary | ICD-10-CM

## 2023-01-23 DIAGNOSIS — I10 Essential (primary) hypertension: Secondary | ICD-10-CM | POA: Diagnosis not present

## 2023-01-23 HISTORY — PX: TOTAL KNEE ARTHROPLASTY: SHX125

## 2023-01-23 LAB — SURGICAL PCR SCREEN
MRSA, PCR: NEGATIVE
Staphylococcus aureus: NEGATIVE

## 2023-01-23 SURGERY — ARTHROPLASTY, KNEE, TOTAL
Anesthesia: Monitor Anesthesia Care | Site: Knee | Laterality: Left

## 2023-01-23 MED ORDER — ACETAMINOPHEN 325 MG PO TABS: 325.0000 mg | ORAL_TABLET | Freq: Four times a day (QID) | ORAL | Status: DC | PRN

## 2023-01-23 MED ORDER — METHOCARBAMOL 500 MG PO TABS: 500.0000 mg | ORAL_TABLET | Freq: Four times a day (QID) | ORAL | Status: DC | PRN

## 2023-01-23 MED ORDER — MIDAZOLAM HCL 2 MG/2ML IJ SOLN
INTRAMUSCULAR | Status: AC
  Administered 2023-01-23: 2 mg via INTRAVENOUS
  Filled 2023-01-23: qty 2

## 2023-01-23 MED ORDER — ONDANSETRON HCL 4 MG PO TABS: 4.0000 mg | ORAL_TABLET | Freq: Four times a day (QID) | ORAL | Status: DC | PRN

## 2023-01-23 MED ORDER — AMISULPRIDE (ANTIEMETIC) 5 MG/2ML IV SOLN: 10.0000 mg | Freq: Once | INTRAVENOUS | Status: DC | PRN

## 2023-01-23 MED ORDER — METHOCARBAMOL 1000 MG/10ML IJ SOLN: 500.0000 mg | Freq: Four times a day (QID) | INTRAVENOUS | Status: DC | PRN

## 2023-01-23 MED ORDER — DEXAMETHASONE SODIUM PHOSPHATE 10 MG/ML IJ SOLN
INTRAMUSCULAR | Status: DC | PRN
  Administered 2023-01-23: 10 mg via INTRAVENOUS

## 2023-01-23 MED ORDER — SODIUM CHLORIDE 0.9 % IV SOLN: INTRAVENOUS | Status: DC

## 2023-01-23 MED ORDER — MENTHOL 3 MG MT LOZG: 1.0000 | LOZENGE | OROMUCOSAL | Status: DC | PRN

## 2023-01-23 MED ORDER — MIDAZOLAM HCL 2 MG/2ML IJ SOLN: 2.0000 mg | Freq: Once | INTRAMUSCULAR | Status: AC

## 2023-01-23 MED ORDER — LACTATED RINGERS IV SOLN: INTRAVENOUS | Status: DC

## 2023-01-23 MED ORDER — PHENOL 1.4 % MT LIQD: 1.0000 | OROMUCOSAL | Status: DC | PRN

## 2023-01-23 MED ORDER — LISINOPRIL-HYDROCHLOROTHIAZIDE 20-12.5 MG PO TABS: 1.0000 | ORAL_TABLET | Freq: Every day | ORAL | Status: DC

## 2023-01-23 MED ORDER — TRANEXAMIC ACID-NACL 1000-0.7 MG/100ML-% IV SOLN
1000.0000 mg | Freq: Once | INTRAVENOUS | Status: AC
  Administered 2023-01-23: 1000 mg via INTRAVENOUS
  Filled 2023-01-23: qty 100

## 2023-01-23 MED ORDER — OXYCODONE HCL 5 MG PO TABS: 10.0000 mg | ORAL_TABLET | ORAL | Status: DC | PRN

## 2023-01-23 MED ORDER — POVIDONE-IODINE 10 % EX SWAB
2.0000 | Freq: Once | CUTANEOUS | Status: AC
  Administered 2023-01-23: 2 via TOPICAL

## 2023-01-23 MED ORDER — ROPIVACAINE HCL 5 MG/ML IJ SOLN
INTRAMUSCULAR | Status: DC | PRN
  Administered 2023-01-23: 20 mL via PERINEURAL

## 2023-01-23 MED ORDER — TRANEXAMIC ACID 1000 MG/10ML IV SOLN
INTRAVENOUS | Status: DC | PRN
  Administered 2023-01-23: 2000 mg via TOPICAL

## 2023-01-23 MED ORDER — CEFAZOLIN SODIUM-DEXTROSE 2-4 GM/100ML-% IV SOLN
2.0000 g | Freq: Four times a day (QID) | INTRAVENOUS | Status: AC
  Administered 2023-01-23 (×2): 2 g via INTRAVENOUS
  Filled 2023-01-23 (×2): qty 100

## 2023-01-23 MED ORDER — KETOROLAC TROMETHAMINE 15 MG/ML IJ SOLN
15.0000 mg | Freq: Four times a day (QID) | INTRAMUSCULAR | Status: DC
  Administered 2023-01-23: 15 mg via INTRAVENOUS
  Filled 2023-01-23 (×2): qty 1

## 2023-01-23 MED ORDER — OXYCODONE HCL 5 MG PO TABS: 5.0000 mg | ORAL_TABLET | Freq: Once | ORAL | Status: DC | PRN

## 2023-01-23 MED ORDER — DOCUSATE SODIUM 100 MG PO CAPS
100.0000 mg | ORAL_CAPSULE | Freq: Two times a day (BID) | ORAL | Status: DC
  Administered 2023-01-23 – 2023-01-24 (×2): 100 mg via ORAL
  Filled 2023-01-23 (×2): qty 1

## 2023-01-23 MED ORDER — ACETAMINOPHEN 500 MG PO TABS
1000.0000 mg | ORAL_TABLET | Freq: Once | ORAL | Status: AC
  Administered 2023-01-23: 1000 mg via ORAL
  Filled 2023-01-23: qty 2

## 2023-01-23 MED ORDER — ASPIRIN 81 MG PO CHEW
81.0000 mg | CHEWABLE_TABLET | Freq: Two times a day (BID) | ORAL | Status: DC
Start: 2023-01-23 — End: 2023-01-23

## 2023-01-23 MED ORDER — FERROUS SULFATE 325 (65 FE) MG PO TABS
325.0000 mg | ORAL_TABLET | Freq: Three times a day (TID) | ORAL | Status: DC
  Administered 2023-01-23 – 2023-01-24 (×2): 325 mg via ORAL
  Filled 2023-01-23 (×3): qty 1

## 2023-01-23 MED ORDER — PROPOFOL 500 MG/50ML IV EMUL
INTRAVENOUS | Status: DC | PRN
  Administered 2023-01-23: 20 mg via INTRAVENOUS
  Administered 2023-01-23: 75 ug/kg/min via INTRAVENOUS

## 2023-01-23 MED ORDER — FENTANYL CITRATE (PF) 100 MCG/2ML IJ SOLN: 25.0000 ug | INTRAMUSCULAR | Status: DC | PRN

## 2023-01-23 MED ORDER — OXYCODONE HCL 5 MG PO TABS: 5.0000 mg | ORAL_TABLET | ORAL | Status: DC | PRN

## 2023-01-23 MED ORDER — ORAL CARE MOUTH RINSE: 15.0000 mL | Freq: Once | OROMUCOSAL | Status: AC

## 2023-01-23 MED ORDER — OXYCODONE HCL 5 MG/5ML PO SOLN: 5.0000 mg | Freq: Once | ORAL | Status: DC | PRN

## 2023-01-23 MED ORDER — OXYCODONE HCL ER 10 MG PO T12A
10.0000 mg | EXTENDED_RELEASE_TABLET | Freq: Two times a day (BID) | ORAL | Status: DC
  Administered 2023-01-23 – 2023-01-24 (×3): 10 mg via ORAL
  Filled 2023-01-23 (×3): qty 1

## 2023-01-23 MED ORDER — KETOROLAC TROMETHAMINE 15 MG/ML IJ SOLN
15.0000 mg | Freq: Four times a day (QID) | INTRAMUSCULAR | Status: AC
  Administered 2023-01-23 – 2023-01-24 (×3): 15 mg via INTRAVENOUS
  Filled 2023-01-23 (×3): qty 1

## 2023-01-23 MED ORDER — LISINOPRIL 20 MG PO TABS
20.0000 mg | ORAL_TABLET | Freq: Every day | ORAL | Status: DC
  Administered 2023-01-23 – 2023-01-24 (×2): 20 mg via ORAL
  Filled 2023-01-23 (×2): qty 1

## 2023-01-23 MED ORDER — FENTANYL CITRATE (PF) 250 MCG/5ML IJ SOLN
INTRAMUSCULAR | Status: AC
  Filled 2023-01-23: qty 5

## 2023-01-23 MED ORDER — SODIUM CHLORIDE 0.9 % IR SOLN
Status: DC | PRN
  Administered 2023-01-23: 3000 mL

## 2023-01-23 MED ORDER — TRANEXAMIC ACID-NACL 1000-0.7 MG/100ML-% IV SOLN
1000.0000 mg | INTRAVENOUS | Status: AC
  Administered 2023-01-23: 1000 mg via INTRAVENOUS
  Filled 2023-01-23: qty 100

## 2023-01-23 MED ORDER — MIDAZOLAM HCL 2 MG/2ML IJ SOLN
INTRAMUSCULAR | Status: DC | PRN
  Administered 2023-01-23 (×2): 1 mg via INTRAVENOUS

## 2023-01-23 MED ORDER — CEFAZOLIN SODIUM-DEXTROSE 2-4 GM/100ML-% IV SOLN
2.0000 g | INTRAVENOUS | Status: AC
  Administered 2023-01-23: 2 g via INTRAVENOUS
  Filled 2023-01-23: qty 100

## 2023-01-23 MED ORDER — HYDROCHLOROTHIAZIDE 12.5 MG PO TABS
12.5000 mg | ORAL_TABLET | Freq: Every day | ORAL | Status: DC
  Administered 2023-01-23 – 2023-01-24 (×2): 12.5 mg via ORAL
  Filled 2023-01-23 (×2): qty 1

## 2023-01-23 MED ORDER — DEXAMETHASONE SODIUM PHOSPHATE 10 MG/ML IJ SOLN
10.0000 mg | Freq: Once | INTRAMUSCULAR | Status: AC
  Administered 2023-01-24: 10 mg via INTRAVENOUS
  Filled 2023-01-23: qty 1

## 2023-01-23 MED ORDER — ASPIRIN 81 MG PO TBEC
81.0000 mg | DELAYED_RELEASE_TABLET | Freq: Two times a day (BID) | ORAL | Status: DC
  Administered 2023-01-23 – 2023-01-24 (×2): 81 mg via ORAL
  Filled 2023-01-23 (×2): qty 1

## 2023-01-23 MED ORDER — ONDANSETRON HCL 4 MG/2ML IJ SOLN: 4.0000 mg | Freq: Four times a day (QID) | INTRAMUSCULAR | Status: DC | PRN

## 2023-01-23 MED ORDER — FENTANYL CITRATE (PF) 250 MCG/5ML IJ SOLN
INTRAMUSCULAR | Status: DC | PRN
  Administered 2023-01-23: 50 ug via INTRAVENOUS

## 2023-01-23 MED ORDER — ORAL CARE MOUTH RINSE: 15.0000 mL | OROMUCOSAL | Status: DC | PRN

## 2023-01-23 MED ORDER — MIDAZOLAM HCL 2 MG/2ML IJ SOLN
INTRAMUSCULAR | Status: AC
  Filled 2023-01-23: qty 2

## 2023-01-23 MED ORDER — PRONTOSAN WOUND IRRIGATION OPTIME
TOPICAL | Status: DC | PRN
  Administered 2023-01-23: 100 mL via TOPICAL

## 2023-01-23 MED ORDER — BUPIVACAINE-MELOXICAM ER 400-12 MG/14ML IJ SOLN
INTRAMUSCULAR | Status: AC
  Filled 2023-01-23: qty 1

## 2023-01-23 MED ORDER — CHLORHEXIDINE GLUCONATE 0.12 % MT SOLN
15.0000 mL | Freq: Once | OROMUCOSAL | Status: AC
  Administered 2023-01-23: 15 mL via OROMUCOSAL
  Filled 2023-01-23: qty 15

## 2023-01-23 MED ORDER — ONDANSETRON HCL 4 MG/2ML IJ SOLN
INTRAMUSCULAR | Status: DC | PRN
  Administered 2023-01-23: 4 mg via INTRAVENOUS

## 2023-01-23 MED ORDER — METOCLOPRAMIDE HCL 5 MG/ML IJ SOLN: 5.0000 mg | Freq: Three times a day (TID) | INTRAMUSCULAR | Status: DC | PRN

## 2023-01-23 MED ORDER — METOCLOPRAMIDE HCL 5 MG PO TABS: 5.0000 mg | ORAL_TABLET | Freq: Three times a day (TID) | ORAL | Status: DC | PRN

## 2023-01-23 MED ORDER — ACETAMINOPHEN 10 MG/ML IV SOLN
INTRAVENOUS | Status: AC
  Filled 2023-01-23: qty 100

## 2023-01-23 MED ORDER — 0.9 % SODIUM CHLORIDE (POUR BTL) OPTIME
TOPICAL | Status: DC | PRN
  Administered 2023-01-23: 1000 mL

## 2023-01-23 MED ORDER — BUPIVACAINE-MELOXICAM ER 400-12 MG/14ML IJ SOLN
INTRAMUSCULAR | Status: DC | PRN
  Administered 2023-01-23: 14 mL

## 2023-01-23 MED ORDER — PHENYLEPHRINE HCL-NACL 20-0.9 MG/250ML-% IV SOLN
INTRAVENOUS | Status: DC | PRN
  Administered 2023-01-23: 20 ug/min via INTRAVENOUS

## 2023-01-23 MED ORDER — VANCOMYCIN HCL 1000 MG IV SOLR
INTRAVENOUS | Status: DC | PRN
  Administered 2023-01-23: 1000 mg

## 2023-01-23 MED ORDER — BUPIVACAINE IN DEXTROSE 0.75-8.25 % IT SOLN
INTRATHECAL | Status: DC | PRN
  Administered 2023-01-23: 1.6 mL via INTRATHECAL

## 2023-01-23 MED ORDER — HYDROMORPHONE HCL 1 MG/ML IJ SOLN: 0.5000 mg | INTRAMUSCULAR | Status: DC | PRN

## 2023-01-23 MED ORDER — ACETAMINOPHEN 500 MG PO TABS
1000.0000 mg | ORAL_TABLET | Freq: Four times a day (QID) | ORAL | Status: DC
  Administered 2023-01-23: 1000 mg via ORAL
  Filled 2023-01-23 (×2): qty 2

## 2023-01-23 MED ORDER — ACETAMINOPHEN 500 MG PO TABS
1000.0000 mg | ORAL_TABLET | Freq: Four times a day (QID) | ORAL | Status: AC
  Administered 2023-01-23 – 2023-01-24 (×2): 1000 mg via ORAL
  Filled 2023-01-23 (×3): qty 2

## 2023-01-23 SURGICAL SUPPLY — 84 items
ADH SKN CLS APL DERMABOND .7 (GAUZE/BANDAGES/DRESSINGS) ×1
ALCOHOL 70% 16 OZ (MISCELLANEOUS) ×1 IMPLANT
BAG COUNTER SPONGE SURGICOUNT (BAG) IMPLANT
BAG DECANTER FOR FLEXI CONT (MISCELLANEOUS) ×1 IMPLANT
BAG SPNG CNTER NS LX DISP (BAG)
BANDAGE ESMARK 6X9 LF (GAUZE/BANDAGES/DRESSINGS) IMPLANT
BLADE SAG 18X100X1.27 (BLADE) ×1 IMPLANT
BLADE SAW SGTL 73X25 THK (BLADE) ×1 IMPLANT
BNDG CMPR 9X6 STRL LF SNTH (GAUZE/BANDAGES/DRESSINGS)
BNDG ESMARK 6X9 LF (GAUZE/BANDAGES/DRESSINGS)
BOWL SMART MIX CTS (DISPOSABLE) ×1 IMPLANT
CLSR STERI-STRIP ANTIMIC 1/2X4 (GAUZE/BANDAGES/DRESSINGS) ×2 IMPLANT
COMP FEM PS STD KNEE 12 LT (Joint) ×1 IMPLANT
COMP PATELLAR 10X35 METAL (Joint) ×1 IMPLANT
COMP TIB KNEE H 0D LT (Joint) ×1 IMPLANT
COMPONENT FEM PS STD KN 12 LT (Joint) IMPLANT
COMPONENT PATELLAR 10X35 METAL (Joint) IMPLANT
COMPONENT TIB KNEE H 0D LT (Joint) IMPLANT
COOLER ICEMAN CLASSIC (MISCELLANEOUS) ×1 IMPLANT
COVER SURGICAL LIGHT HANDLE (MISCELLANEOUS) ×1 IMPLANT
CUFF TOURN SGL QUICK 34 (TOURNIQUET CUFF) ×1
CUFF TOURN SGL QUICK 42 (TOURNIQUET CUFF) IMPLANT
CUFF TRNQT CYL 34X4.125X (TOURNIQUET CUFF) ×1 IMPLANT
DERMABOND ADVANCED .7 DNX12 (GAUZE/BANDAGES/DRESSINGS) ×1 IMPLANT
DRAPE EXTREMITY T 121X128X90 (DISPOSABLE) ×1 IMPLANT
DRAPE HALF SHEET 40X57 (DRAPES) ×1 IMPLANT
DRAPE INCISE IOBAN 66X45 STRL (DRAPES) ×1 IMPLANT
DRAPE ORTHO SPLIT 77X108 STRL (DRAPES)
DRAPE POUCH INSTRU U-SHP 10X18 (DRAPES) ×1 IMPLANT
DRAPE SURG ORHT 6 SPLT 77X108 (DRAPES) IMPLANT
DRAPE U-SHAPE 47X51 STRL (DRAPES) ×2 IMPLANT
DRSG AQUACEL AG ADV 3.5X10 (GAUZE/BANDAGES/DRESSINGS) ×1 IMPLANT
DURAPREP 26ML APPLICATOR (WOUND CARE) ×3 IMPLANT
ELECT CAUTERY BLADE 6.4 (BLADE) ×1 IMPLANT
ELECT PENCIL ROCKER SW 15FT (MISCELLANEOUS) ×1 IMPLANT
ELECT REM PT RETURN 9FT ADLT (ELECTROSURGICAL) ×1
ELECTRODE REM PT RTRN 9FT ADLT (ELECTROSURGICAL) ×1 IMPLANT
GLOVE BIOGEL PI IND STRL 7.0 (GLOVE) ×2 IMPLANT
GLOVE BIOGEL PI IND STRL 7.5 (GLOVE) ×5 IMPLANT
GLOVE ECLIPSE 7.0 STRL STRAW (GLOVE) ×3 IMPLANT
GLOVE INDICATOR 7.0 STRL GRN (GLOVE) ×1 IMPLANT
GLOVE INDICATOR 7.5 STRL GRN (GLOVE) ×1 IMPLANT
GLOVE SURG SYN 7.5 E (GLOVE) ×2 IMPLANT
GLOVE SURG SYN 7.5 PF PI (GLOVE) ×2 IMPLANT
GLOVE SURG UNDER LTX SZ7.5 (GLOVE) ×2 IMPLANT
GLOVE SURG UNDER POLY LF SZ7 (GLOVE) ×2 IMPLANT
GOWN STRL REUS W/ TWL LRG LVL3 (GOWN DISPOSABLE) ×1 IMPLANT
GOWN STRL REUS W/TWL LRG LVL3 (GOWN DISPOSABLE) ×1
GOWN STRL SURGICAL XL XLNG (GOWN DISPOSABLE) ×1 IMPLANT
GOWN TOGA ZIPPER T7+ PEEL AWAY (MISCELLANEOUS) ×2 IMPLANT
HANDPIECE INTERPULSE COAX TIP (DISPOSABLE) ×1
HOOD PEEL AWAY T7 (MISCELLANEOUS) ×1 IMPLANT
KIT BASIN OR (CUSTOM PROCEDURE TRAY) ×1 IMPLANT
KIT TURNOVER KIT B (KITS) ×1 IMPLANT
LINER TIB ASF GH/12 14 LT (Liner) IMPLANT
MANIFOLD NEPTUNE II (INSTRUMENTS) ×1 IMPLANT
MARKER SKIN DUAL TIP RULER LAB (MISCELLANEOUS) ×2 IMPLANT
NDL SPNL 18GX3.5 QUINCKE PK (NEEDLE) ×1 IMPLANT
NEEDLE SPNL 18GX3.5 QUINCKE PK (NEEDLE) ×1 IMPLANT
NS IRRIG 1000ML POUR BTL (IV SOLUTION) ×1 IMPLANT
PACK TOTAL JOINT (CUSTOM PROCEDURE TRAY) ×1 IMPLANT
PAD ARMBOARD 7.5X6 YLW CONV (MISCELLANEOUS) ×2 IMPLANT
PAD COLD SHLDR WRAP-ON (PAD) ×1 IMPLANT
PIN DRILL HDLS TROCAR 75 4PK (PIN) IMPLANT
SCREW FEMALE HEX FIX 25X2.5 (ORTHOPEDIC DISPOSABLE SUPPLIES) IMPLANT
SET HNDPC FAN SPRY TIP SCT (DISPOSABLE) ×1 IMPLANT
SOLUTION PRONTOSAN WOUND 350ML (IRRIGATION / IRRIGATOR) ×1 IMPLANT
STAPLER VISISTAT 35W (STAPLE) IMPLANT
SUCTION TUBE FRAZIER 10FR DISP (SUCTIONS) ×1 IMPLANT
SUT ETHILON 2 0 FS 18 (SUTURE) IMPLANT
SUT MNCRL AB 3-0 PS2 27 (SUTURE) IMPLANT
SUT VIC AB 0 CT1 27 (SUTURE) ×2
SUT VIC AB 0 CT1 27XBRD ANBCTR (SUTURE) ×2 IMPLANT
SUT VIC AB 1 CTX 27 (SUTURE) ×3 IMPLANT
SUT VIC AB 2-0 CT1 (SUTURE) IMPLANT
SUT VIC AB 2-0 CT1 27 (SUTURE) ×4
SUT VIC AB 2-0 CT1 TAPERPNT 27 (SUTURE) ×4 IMPLANT
SYR 50ML LL SCALE MARK (SYRINGE) ×2 IMPLANT
TOWEL GREEN STERILE (TOWEL DISPOSABLE) ×1 IMPLANT
TOWEL GREEN STERILE FF (TOWEL DISPOSABLE) ×1 IMPLANT
TRAY CATH INTERMITTENT SS 16FR (CATHETERS) IMPLANT
TUBE SUCT ARGYLE STRL (TUBING) ×1 IMPLANT
UNDERPAD 30X36 HEAVY ABSORB (UNDERPADS AND DIAPERS) ×1 IMPLANT
YANKAUER SUCT BULB TIP NO VENT (SUCTIONS) ×2 IMPLANT

## 2023-01-23 NOTE — Discharge Instructions (Signed)

## 2023-01-23 NOTE — Anesthesia Postprocedure Evaluation (Signed)
Anesthesia Post Note  Patient: Karmelo D Berges  Procedure(s) Performed: LEFT TOTAL KNEE ARTHROPLASTY (Left: Knee)     Patient location during evaluation: PACU Anesthesia Type: MAC and Spinal Level of consciousness: awake Pain management: pain level controlled Vital Signs Assessment: post-procedure vital signs reviewed and stable Respiratory status: spontaneous breathing, respiratory function stable and nonlabored ventilation Cardiovascular status: blood pressure returned to baseline and stable Postop Assessment: no headache, no backache and no apparent nausea or vomiting Anesthetic complications: no   No notable events documented.  Last Vitals:  Vitals:   01/23/23 1355 01/23/23 1357  BP: (!) 157/89 (!) 157/89  Pulse: 63 (!) 56  Resp: 14   Temp: (!) 36.4 C (!) 36.4 C  SpO2: 98% 98%    Last Pain:  Vitals:   01/23/23 1357  TempSrc: Oral  PainSc:                  Linton Rump

## 2023-01-23 NOTE — H&P (Signed)
PREOPERATIVE H&P  Chief Complaint: left knee osteoarthritis  HPI: James Bird is a 62 y.o. male who presents for surgical treatment of left knee osteoarthritis.  He denies any changes in medical history.  Past Medical History:  Diagnosis Date   Allergy    RHINITS   Chronic shoulder pain    left   Constipation 2019   Diverticulosis    ED (erectile dysfunction)    Former smoker    quit late 2018   H/O echocardiogram 03/20/08   mild - moderate LVH, EF 50-55%, mild RV enlargement, mild mitral regurgitation, Dr. 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 Reyes Ivan   History of neck surgery    Hyperlipidemia    Hypertension    Impaired fasting glucose    Past Surgical History:  Procedure Laterality Date   COLONOSCOPY  10/2010   diverticulosis, othewrise normal, repeat 10 years, Dr. Marina Goodell   SPINE SURGERY  2011(JENKINS,MD)   CERVICAL DISC AND FUSION   Social History   Socioeconomic History   Marital status: Single    Spouse name: Not on file   Number of children: Not on file   Years of education: Not on file   Highest education level: Not on file  Occupational History   Occupation: Building control surveyor    Employer: [employer]  Tobacco Use   Smoking status: Former    Packs/day: 0.50    Years: 16.00    Additional pack years: 0.00    Total pack years: 8.00    Types: Cigarettes    Quit date: 05/15/2017    Years since quitting: 5.6   Smokeless tobacco: Never  Vaping Use   Vaping Use: Never used  Substance and Sexual Activity   Alcohol use: Yes    Alcohol/week: 10.0 standard drinks of alcohol    Types: 10 Cans of beer per week    Comment: 10 drinks per week, sometimes more   Drug use: Not Currently    Comment: 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    02/2020   Social Determinants of Health   Financial Resource Strain: Not on file  Food Insecurity: Not on file  Transportation Needs: Not on file  Physical Activity: Not on file  Stress: Not on file  Social Connections: Not on file   Family History  Problem Relation Age of Onset   Cancer Mother        LUNG   Heart disease Father 27       MI   Cancer Paternal Aunt        type unknown   Cancer Paternal Uncle        type unknown   Hypertension Brother    Diabetes Neg Hx    Stroke Neg Hx    Colon cancer Neg Hx    Esophageal cancer Neg Hx    Pancreatic cancer Neg Hx    Stomach cancer Neg Hx    Rectal cancer Neg Hx    No Known Allergies Prior to Admission medications   Medication Sig Start Date End Date Taking? Authorizing Provider  atorvastatin (LIPITOR) 20 MG tablet TAKE 1 TABLET(20 MG) BY MOUTH DAILY 11/23/22  Yes Ronnald Nian, MD  Cholecalciferol (VITAMIN D3) 1.25 MG (50000 UT) TABS Take 1 tablet by mouth once a week. 10/26/22  Yes Early, Sung Amabile, NP  doxylamine, Sleep, (UNISOM) 25 MG tablet Take 12.5 mg by mouth at bedtime as needed for sleep.   Yes [provider]  linaclotide (LINZESS) 72 MCG capsule Take 1 capsule (72 mcg total) by mouth daily before breakfast. Patient taking differently: Take 72 mcg by mouth daily as needed (constipation). 05/17/22  Yes Ronnald Nian, MD  lisinopril-hydrochlorothiazide (ZESTORETIC) 20-12.5 MG tablet TAKE 1 TABLET BY MOUTH DAILY 11/14/22  Yes Ronnald Nian, MD  aspirin EC 81 MG tablet Take 1 tablet (81 mg total) by mouth 2 (two) times daily. To  be taken after surgery to prevent blood clots 01/17/23 01/17/24  Cristie Hem, PA-C  docusate sodium (COLACE) 100 MG capsule Take 1 capsule (100 mg total) by mouth daily as needed. 01/17/23 01/17/24  Cristie Hem, PA-C  methocarbamol (ROBAXIN-750)  750 MG tablet Take 1 tablet (750 mg total) by mouth 2 (two) times daily as needed for muscle spasms. 01/17/23   Cristie Hem, PA-C  ondansetron (ZOFRAN) 4 MG tablet Take 1 tablet (4 mg total) by mouth every 8 (eight) hours as needed for nausea or vomiting. 01/17/23   Cristie Hem, PA-C  oxyCODONE-acetaminophen (PERCOCET) 5-325 MG tablet Take 1-2 tablets by mouth every 6 (six) hours as needed. To be taken after surgery 01/17/23   Cristie Hem, PA-C  tadalafil (CIALIS) 20 MG tablet Take 1 tablet (20 mg total) by mouth daily as needed for erectile dysfunction. 05/17/22   Ronnald Nian, MD  valACYclovir (VALTREX) 1000 MG tablet Take 1 tablet (1,000 mg total) by mouth 2 (two) times daily. Patient taking differently: Take 1,000 mg by mouth 2 (two) times daily as needed (herpes). 07/29/22   Ronnald Nian, MD     Positive ROS: All other systems have been reviewed and were otherwise negative with the exception of those mentioned in the HPI and as above.  Physical Exam: General: Alert, no acute distress Cardiovascular: No pedal edema Respiratory: No cyanosis, no use of accessory musculature GI: abdomen soft Skin: No lesions in the area of chief complaint Neurologic: Sensation intact distally Psychiatric: Patient is competent for consent with normal mood and affect Lymphatic: no lymphedema  MUSCULOSKELETAL: exam stable  Assessment: left knee osteoarthritis  Plan: Plan for Procedure(s): LEFT TOTAL KNEE ARTHROPLASTY  The risks benefits and alternatives were discussed with the patient including but not limited to the risks of nonoperative treatment, versus surgical intervention including infection, bleeding, nerve injury,  blood clots, cardiopulmonary complications, morbidity, mortality, among others, and they were willing to proceed.   Glee Arvin, MD 01/23/2023 8:51 AM

## 2023-01-23 NOTE — Evaluation (Signed)
Physical Therapy Evaluation Patient Details Name: James Bird MRN: [id number] DOB: [birth year] Today's Date: 01/23/2023  History of Present Illness  62 y.o. male presents to [facility] hospital on 01/23/2023 for elective L TKA. PMH includes diverticulosis, ED, herpes simplex, HLD, HTN.  Clinical Impression  Pt presents to PT with deficits in ROM, strength, power, gait, balance, endurance. Pt is mobilizing well s/p TKA, ambulating for household distances with support of RW. PT provides education on TKR exercise handout, as well as education on the need for knee extension when resting. PT will follow up tomorrow for further gait and stair training.     Recommendations for follow up therapy are one component of a multi-disciplinary discharge planning process, led by the attending physician.  Recommendations may be updated based on patient status, additional functional criteria and insurance authorization.  Follow Up Recommendations       Assistance Recommended at Discharge PRN  Patient can return home with the following  A little help with bathing/dressing/bathroom;Assistance with cooking/housework;Assist for transportation;Help with stairs or ramp for entrance    Equipment Recommendations Rolling walker (2 wheels)  Recommendations for Other Services       Functional Status Assessment Patient has had a recent decline in their functional status and demonstrates the ability to make significant improvements in function in a reasonable and predictable amount of time.     Precautions / Restrictions Precautions Precautions: Knee;Fall Precaution Booklet Issued: Yes (comment) Restrictions Weight Bearing Restrictions: Yes LLE Weight Bearing: Weight bearing as tolerated      Mobility  Bed Mobility Overal bed mobility: Needs Assistance Bed Mobility: Supine to Sit, Sit to Supine     Supine to sit: Supervision Sit to supine: Supervision        Transfers Overall transfer level: Needs  assistance Equipment used: Rolling walker (2 wheels) Transfers: Sit to/from Stand Sit to Stand: Supervision                Ambulation/Gait Ambulation/Gait assistance: Supervision Gait Distance (Feet): 100 Feet Assistive device: Rolling walker (2 wheels) Gait Pattern/deviations: Step-through pattern Gait velocity: reduced Gait velocity interpretation: <1.8 ft/sec, indicate of risk for recurrent falls   General Gait Details: slowed step-through gait, mild increase in L knee flexion during stance phase, no buckling observed  Stairs            Wheelchair Mobility    Modified Rankin (Stroke Patients Only)       Balance Overall balance assessment: Needs assistance Sitting-balance support: No upper extremity supported, Feet supported Sitting balance-Leahy Scale: Good     Standing balance support: Reliant on assistive device for balance, Single extremity supported Standing balance-Leahy Scale: Poor                               Pertinent Vitals/Pain Pain Assessment Pain Assessment: No/denies pain    Home Living Family/patient expects to be discharged to:: Private residence Living Arrangements: Alone Available Help at Discharge: Family;Available 24 hours/day (1st week) Type of Home: House Home Access: Stairs to enter Entrance Stairs-Rails: Can reach both Entrance Stairs-Number of Steps: 3+1   Home Layout: One level;Laundry or work area in [employer] Equipment: Information systems manager      Prior Function Prior Level of Function : Independent/Modified Independent;Working/employed;Driving                     Hand Dominance        Extremity/Trunk Assessment  Upper Extremity Assessment Upper Extremity Assessment: Overall WFL for tasks assessed    Lower Extremity Assessment Lower Extremity Assessment: LLE deficits/detail LLE Deficits / Details: post-op knee ROM deficits as expected s/p TKA, 3/5 knee extension, 4-/5 ankle DF/PD and hip  flexion LLE Sensation: decreased light touch    Cervical / Trunk Assessment Cervical / Trunk Assessment: Normal  Communication   Communication: No difficulties  Cognition Arousal/Alertness: Awake/alert Behavior During Therapy: WFL for tasks assessed/performed Overall Cognitive Status: Within Functional Limits for tasks assessed                                          General Comments General comments (skin integrity, edema, etc.): VSS on RA    Exercises     Assessment/Plan    PT Assessment Patient needs continued PT services  PT Problem List Decreased strength;Decreased range of motion;Decreased activity tolerance;Decreased balance;Decreased mobility;Pain       PT Treatment Interventions DME instruction;Gait training;Stair training;Functional mobility training;Therapeutic activities;Therapeutic exercise;Balance training;Neuromuscular re-education;Patient/family education    PT Goals (Current goals can be found in the Care Plan section)  Acute Rehab PT Goals Patient Stated Goal: to return to independence PT Goal Formulation: With patient Time For Goal Achievement: 01/27/23 Potential to Achieve Goals: Good    Frequency 7X/week     Co-evaluation               AM-PAC PT "6 Clicks" Mobility  Outcome Measure Help needed turning from your back to your side while in a flat bed without using bedrails?: A Little Help needed moving from lying on your back to sitting on the side of a flat bed without using bedrails?: A Little Help needed moving to and from a bed to a chair (including a wheelchair)?: A Little Help needed standing up from a chair using your arms (e.g., wheelchair or bedside chair)?: A Little Help needed to walk in hospital room?: A Little Help needed climbing 3-5 steps with a railing? : A Little 6 Click Score: 18    End of Session   Activity Tolerance: Patient tolerated treatment well Patient left: in bed;with call bell/phone within  reach;with family/visitor present Nurse Communication: Mobility status PT Visit Diagnosis: Other abnormalities of gait and mobility (R26.89);Muscle weakness (generalized) (M62.81)    Time: 1610-9604 PT Time Calculation (min) (ACUTE ONLY): 24 min   Charges:   PT Evaluation $PT Eval Low Complexity: 1 Low          Arlyss Gandy, PT, DPT Acute Rehabilitation Office [phone]   Arlyss Gandy 01/23/2023, 4:25 PM

## 2023-01-23 NOTE — Op Note (Signed)
Total Knee Arthroplasty Procedure Note  Preoperative diagnosis: Left knee osteoarthritis  Postoperative diagnosis:same  Operative findings: Complete loss of joint space Flexion contracture  Operative procedure: Left total knee arthroplasty. CPT [phone]  Surgeon: N. Glee Arvin, MD  Assist: Oneal Grout, PA-C; necessary for the timely completion of procedure and due to complexity of procedure.  Anesthesia: Spinal, regional  Tourniquet time: see anesthesia record  Implants used: Zimmer persona pressfit Femur: CR 12 Tibia: H Patella: 35 mm Polyethylene: 14 mm, medial congruent  Indication: James Bird is a 62 y.o. year old male with a history of knee pain. Having failed conservative management, the patient elected to proceed with a total knee arthroplasty.  We have reviewed the risk and benefits of the surgery and they elected to proceed after voicing understanding.  Procedure:  After informed consent was obtained and understanding of the risk were voiced including but not limited to bleeding, infection, damage to surrounding structures including nerves and vessels, blood clots, leg length inequality and the failure to achieve desired results, the operative extremity was marked with verbal confirmation of the patient in the holding area.   The patient was then brought to the operating room and transported to the operating room table in the supine position.  A tourniquet was applied to the operative extremity around the upper thigh. The operative limb was then prepped and draped in the usual sterile fashion and preoperative antibiotics were administered.  A time out was performed prior to the start of surgery confirming the correct extremity, preoperative antibiotic administration, as well as team members, implants and instruments available for the case. Correct surgical site was also confirmed with preoperative radiographs. The limb was then elevated for exsanguination and  the tourniquet was inflated. A midline incision was made and a standard medial parapatellar approach was performed.  The patella was prepared and sized to a 35 mm.  A cover was placed on the patella for protection from retractors.  We then turned our attention to the femur. Posterior cruciate ligament was sacrificed. Start site was drilled in the femur and the intramedullary distal femoral cutting guide was placed, set at 5 degrees valgus, taking 12 mm of distal resection. The distal cut was made. Osteophytes were then removed.   Next, the proximal tibial cutting guide was placed with appropriate slope, varus/valgus alignment and depth of resection. The proximal tibial cut was made taking 2 mm off the low side. Gap blocks were then used to assess the extension gap and alignment, and appropriate soft tissue releases were performed. Attention was turned back to the femur, which was sized using the sizing guide to a size 12. Appropriate rotation of the femoral component was determined using epicondylar axis, Whiteside's line, and assessing the flexion gap under ligament tension. The appropriate size 4-in-1 cutting block was placed and cuts were made.  Posterior femoral osteophytes and uncapped bone were then removed with the curved osteotome.  Trial components were placed, and stability was checked in full extension, mid-flexion, and deep flexion. Proper tibial rotation was determined and marked.  The patella tracked well without a lateral release. Trial components were then removed and tibial preparation performed.  The tibia was sized for a size H component.  The bony surfaces were irrigated with a pulse lavage and then dried. Final components were placed.  The stability of the construct was re-evaluated throughout a range of motion and found to be acceptable. The trial liner was removed, the knee was copiously irrigated,  and the knee was re-evaluated for any excess bone debris. The real polyethylene liner, 14 mm  thick, was inserted and checked to ensure the locking mechanism had engaged appropriately. The tourniquet was deflated and hemostasis was achieved. The wound was irrigated with normal saline.  One gram of vancomycin powder was placed in the surgical bed.  Topical 0.25% bupivacaine and meloxicam was placed in the joint for postoperative pain.  Capsular closure was performed with a #1 vicryl, subcutaneous fat closed with a 0 vicryl suture, then subcutaneous tissue closed with interrupted 2.0 vicryl suture. The skin was then closed with a 3.0 monocryl and steri strips. A sterile dressing was applied.  The patient was awakened in the operating room and taken to recovery in stable condition. All sponge, needle, and instrument counts were correct at the end of the case.  Tessa Lerner was necessary for opening, closing, retracting, limb positioning and overall facilitation and completion of the surgery.  Position: supine  Complications: none.  Time Out: performed   Drains/Packing: none  Estimated blood loss: minimal  Returned to Recovery Room: in good condition.   Antibiotics: yes   Mechanical VTE (DVT) Prophylaxis: sequential compression devices, TED thigh-high  Chemical VTE (DVT) Prophylaxis: aspirin  Fluid Replacement  Crystalloid: see anesthesia record Blood: none  FFP: none   Specimens Removed: 1 to pathology   Sponge and Instrument Count Correct? yes   PACU: portable radiograph - knee AP and Lateral   Plan/RTC: Return in 2 weeks for wound check.   Weight Bearing/Load Lower Extremity: full   Implant Name Type Inv. Item Serial No. Manufacturer Lot No. LRB No. Used Action  COMP FEM PS STD KNEE 12 LT - [id number] Joint COMP FEM PS STD KNEE 12 LT  ZIMMER RECON(ORTH,TRAU,BIO,SG) [id number] Left 1 Implanted  COMP TIB KNEE H 0D LT - [id number] Joint COMP TIB KNEE H 0D LT  ZIMMER RECON(ORTH,TRAU,BIO,SG) [id number] Left 1 Implanted  COMP PATELLAR 10X35 METAL - [id number] Joint COMP PATELLAR  10X35 METAL  ZIMMER RECON(ORTH,TRAU,BIO,SG) [id number] Left 1 Implanted  LINER TIB ASF GH/12 14 LT - [id number] Liner LINER TIB ASF GH/12 14 LT  ZIMMER RECON(ORTH,TRAU,BIO,SG) [id number] Left 1 Implanted    N. Glee Arvin, MD [facility] 12:02 PM

## 2023-01-23 NOTE — Anesthesia Procedure Notes (Addendum)
Spinal  Patient location during procedure: OR Start time: 01/23/2023 10:32 AM End time: 01/23/2023 10:34 AM Reason for block: surgical anesthesia Staffing Performed: anesthesiologist  Anesthesiologist: Linton Rump, MD Performed by: Linton Rump, MD Authorized by: Linton Rump, MD   Preanesthetic Checklist Completed: patient identified, IV checked, site marked, risks and benefits discussed, surgical consent, monitors and equipment checked, pre-op evaluation and timeout performed Spinal Block Patient position: sitting Prep: DuraPrep Patient monitoring: blood pressure and continuous pulse ox Approach: midline Location: L3-4 Injection technique: single-shot Needle Needle type: Pencan  Needle gauge: 24 G Needle length: 9 cm Additional Notes Risks and benefits of neuraxial anesthesia including, but not limited to, infection, bleeding, local anesthetic toxicity, headache, hypotension, back pain, block failure, etc. were discussed with the patient. The patient expressed understanding and consented to the procedure. I confirmed that the patient has no bleeding disorders and is not taking blood thinners. I confirmed the patient's last platelet count with the nurse. Monitors were applied. A time-out was performed immediately prior to the procedure. Sterile technique was used throughout the whole procedure.   1 attempt(s)

## 2023-01-23 NOTE — Transfer of Care (Signed)
Immediate Anesthesia Transfer of Care Note  Patient: James Bird  Procedure(s) Performed: LEFT TOTAL KNEE ARTHROPLASTY (Left: Knee)  Patient Location: PACU  Anesthesia Type:MAC and Spinal  Level of Consciousness: awake  Airway & Oxygen Therapy: Patient Spontanous Breathing  Post-op Assessment: Report given to RN and Post -op Vital signs reviewed and stable  Post vital signs: Reviewed and stable  Last Vitals:  Vitals Value Taken Time  BP 140/97 01/23/23 1248  Temp    Pulse 70 01/23/23 1250  Resp 13 01/23/23 1250  SpO2 98 % 01/23/23 1250  Vitals shown include unvalidated device data.  Last Pain:  Vitals:   01/23/23 0905  TempSrc:   PainSc: 0-No pain         Complications: No notable events documented.

## 2023-01-23 NOTE — Anesthesia Procedure Notes (Signed)
Anesthesia Regional Block: Adductor canal block   Pre-Anesthetic Checklist: , timeout performed,  Correct Patient, Correct Site, Correct Laterality,  Correct Procedure, Correct Position, site marked,  Risks and benefits discussed,  Surgical consent,  Pre-op evaluation,  At surgeon's request and post-op pain management  Laterality: Left  Prep: chloraprep       Needles:  Injection technique: Single-shot  Needle Type: Echogenic Stimulator Needle     Needle Length: 9cm  Needle Gauge: 21     Additional Needles:   Procedures:,,,, ultrasound used (permanent image in chart),,    Narrative:  Start time: 01/23/2023 8:52 AM End time: 01/23/2023 8:54 AM Injection made incrementally with aspirations every 5 mL.  Performed by: Personally  Anesthesiologist: Linton Rump, MD  Additional Notes: Discussed risks and benefits of nerve block including, but not limited to, prolonged and/or permanent nerve injury involving sensory and/or motor function. Monitors were applied and a time-out was performed. The nerve and associated structures were visualized under ultrasound guidance. After negative aspiration, local anesthetic was slowly injected around the nerve. There was no evidence of high pressure during the procedure. There were no paresthesias. VSS remained stable and the patient tolerated the procedure well.

## 2023-01-24 ENCOUNTER — Encounter (HOSPITAL_COMMUNITY): Payer: Self-pay | Admitting: Orthopaedic Surgery

## 2023-01-24 DIAGNOSIS — M1712 Unilateral primary osteoarthritis, left knee: Secondary | ICD-10-CM | POA: Diagnosis not present

## 2023-01-24 LAB — CBC
HCT: 37.9 % — ABNORMAL LOW (ref 39.0–52.0)
Hemoglobin: 13.2 g/dL (ref 13.0–17.0)
MCH: 30.7 pg (ref 26.0–34.0)
MCHC: 34.8 g/dL (ref 30.0–36.0)
MCV: 88.1 fL (ref 80.0–100.0)
Platelets: 210 10*3/uL (ref 150–400)
RBC: 4.3 MIL/uL (ref 4.22–5.81)
RDW: 15.1 % (ref 11.5–15.5)
WBC: 12.9 10*3/uL — ABNORMAL HIGH (ref 4.0–10.5)
nRBC: 0 % (ref 0.0–0.2)

## 2023-01-24 NOTE — TOC Transition Note (Signed)
Transition of Care [facility]) - CM/SW Discharge Note   Patient Details  Name: James Bird MRN: [id number] Date of Birth: 17-Jan-1961  Transition of Care [facility]) CM/SW Contact:  Lawerance Sabal, RN Phone Number: 01/24/2023, 9:57 AM   Clinical Narrative:     Referral made yesterday to [facility] rep Tresa Endo (By [facility] per Tresa Endo). Verified w [facility] that they can accept, and notified that patient will DC today.  Per TOC assessment, DME ordered through [employer] yesterday.  Bedside RN instructed to verify DME in room for patient to take home at DC.  No other TOC needs identified at this time.    Final next level of care: Home w Home Health Services Barriers to Discharge: No Barriers Identified   Patient Goals and CMS Choice CMS Medicare.gov Compare Post Acute Care list provided to:: Patient Choice offered to / list presented to : Patient  Discharge Placement                         Discharge Plan and Services Additional resources added to the After Visit Summary for                  DME Arranged: Walker rolling, Bedside commode DME Agency: [employer] Date DME Agency Contacted: 01/23/23 Time DME Agency Contacted: [phone] Representative spoke with at DME Agency: Vaughan Basta HH Arranged: PT, OT HH Agency: [facility] Date [facility] Agency Contacted: 01/24/23 Time HH Agency Contacted: [phone] Representative spoke with at [facility] Agency: Tresa Endo  Social Determinants of Health (SDOH) Interventions SDOH Screenings   Food Insecurity: No Food Insecurity (01/23/2023)  Housing: Low Risk  (01/23/2023)  Transportation Needs: No Transportation Needs (01/23/2023)  Utilities: Not At Risk (01/23/2023)  Depression (PHQ2-9): Low Risk  (10/20/2022)  Tobacco Use: Medium Risk (01/23/2023)     Readmission Risk Interventions     No data to display

## 2023-01-24 NOTE — Progress Notes (Signed)
Subjective: 1 Day Post-Op Procedure(s) (LRB): LEFT TOTAL KNEE ARTHROPLASTY (Left) Patient reports pain as mild.  No complaints this am  Objective: Vital signs in last 24 hours: Temp:  [97.5 F (36.4 C)-98.7 F (37.1 C)] 98.3 F (36.8 C) (06/11 0724) Pulse Rate:  [56-86] 69 (06/11 0724) Resp:  [12-27] 18 (06/11 0724) BP: (132-185)/(77-105) 165/101 (06/11 0724) SpO2:  [95 %-100 %] 100 % (06/11 0724) Weight:  [102.1 kg] 102.1 kg (06/10 0802)  Intake/Output from previous day: 06/10 0701 - 06/11 0700 In: 2757.7 [P.O.:720; I.V.:1637.7; IV Piggyback:400] Out: 5025 [Urine:4925; Blood:100] Intake/Output this shift: No intake/output data recorded.  Recent Labs    01/24/23 0204  HGB 13.2   Recent Labs    01/24/23 0204  WBC 12.9*  RBC 4.30  HCT 37.9*  PLT 210   No results for input(s): "NA", "K", "CL", "CO2", "BUN", "CREATININE", "GLUCOSE", "CALCIUM" in the last 72 hours. No results for input(s): "LABPT", "INR" in the last 72 hours.  Neurologically intact Neurovascular intact Sensation intact distally Intact pulses distally Dorsiflexion/Plantar flexion intact Incision: dressing C/D/I No cellulitis present Compartment soft   Assessment/Plan: 1 Day Post-Op Procedure(s) (LRB): LEFT TOTAL KNEE ARTHROPLASTY (Left) Advance diet Up with therapy D/C IV fluids Discharge home with home health Once clears PT AND has urinated  WBAT LLE      Cristie Hem 01/24/2023, 7:57 AM

## 2023-01-24 NOTE — Progress Notes (Signed)
Physical Therapy Treatment Patient Details Name: James Bird MRN: [id number] DOB: 1961-08-15 Today's Date: 01/24/2023   History of Present Illness 62 y.o. male presents to [facility] on 01/23/2023 for elective L TKA. PMH includes diverticulosis, ED, herpes simplex, HLD, HTN.    PT Comments    Pt received in gym with OT and agreeable to session. Pt able to perform stair trial with min guard for safety and no evidence of buckling or unsteadiness. Discussed navigating home set up, activity pacing, exercise progression, resting LLE in extension, and use of ice man machine. Pt requesting that PTA return for gait trial prior to discharge. Pt continues to benefit from PT services to progress toward functional mobility goals.     Recommendations for follow up therapy are one component of a multi-disciplinary discharge planning process, led by the attending physician.  Recommendations may be updated based on patient status, additional functional criteria and insurance authorization.     Assistance Recommended at Discharge PRN  Patient can return home with the following A little help with bathing/dressing/bathroom;Assistance with cooking/housework;Assist for transportation;Help with stairs or ramp for entrance   Equipment Recommendations  Rolling walker (2 wheels)    Recommendations for Other Services       Precautions / Restrictions Precautions Precautions: Knee;Fall Precaution Booklet Issued: Yes (comment) Restrictions Weight Bearing Restrictions: Yes LLE Weight Bearing: Weight bearing as tolerated     Mobility  Bed Mobility               General bed mobility comments: Pt beginning and ending session in recliner    Transfers Overall transfer level: Needs assistance Equipment used: Rolling walker (2 wheels) Transfers: Sit to/from Stand Sit to Stand: Supervision           General transfer comment: from recliner with supervision for safety    Ambulation/Gait                General Gait Details: Pt deferred to next session   Stairs Stairs: Yes Stairs assistance: Min guard Stair Management: Two rails Number of Stairs: 2 (x2) General stair comments: Pt demonstrating no unsteadiness or buckling. Min guard for safety and cues for sequencing       Balance Overall balance assessment: Needs assistance Sitting-balance support: No upper extremity supported, Feet supported Sitting balance-Leahy Scale: Good Sitting balance - Comments: in recliner   Standing balance support: Bilateral upper extremity supported, During functional activity Standing balance-Leahy Scale: Fair Standing balance comment: with RW support                            Cognition Arousal/Alertness: Awake/alert Behavior During Therapy: WFL for tasks assessed/performed Overall Cognitive Status: Within Functional Limits for tasks assessed                                          Exercises      General Comments General comments (skin integrity, edema, etc.): VSS on RA      Pertinent Vitals/Pain Pain Assessment Pain Assessment: No/denies pain     PT Goals (current goals can now be found in the care plan section) Acute Rehab PT Goals Patient Stated Goal: to return to independence PT Goal Formulation: With patient Time For Goal Achievement: 01/27/23 Potential to Achieve Goals: Good Progress towards PT goals: Progressing toward goals    Frequency  7X/week      PT Plan Current plan remains appropriate       AM-PAC PT "6 Clicks" Mobility   Outcome Measure  Help needed turning from your back to your side while in a flat bed without using bedrails?: A Little Help needed moving from lying on your back to sitting on the side of a flat bed without using bedrails?: A Little Help needed moving to and from a bed to a chair (including a wheelchair)?: A Little Help needed standing up from a chair using your arms (e.g., wheelchair or  bedside chair)?: A Little Help needed to walk in hospital room?: A Little Help needed climbing 3-5 steps with a railing? : A Little 6 Click Score: 18    End of Session Equipment Utilized During Treatment: Gait belt Activity Tolerance: Patient tolerated treatment well Patient left: with call bell/phone within reach;in chair Nurse Communication: Mobility status PT Visit Diagnosis: Other abnormalities of gait and mobility (R26.89);Muscle weakness (generalized) (M62.81)     Time: 1308-6578 PT Time Calculation (min) (ACUTE ONLY): 23 min  Charges:  $Gait Training: 8-22 mins $Self Care/Home Management: 8-22                     Johny Shock, PTA Acute Rehabilitation Services Secure Chat Preferred  [phone]    Johny Shock 01/24/2023, 9:35 AM

## 2023-01-24 NOTE — Evaluation (Signed)
Occupational Therapy Evaluation Patient Details Name: James Bird MRN: [id number] DOB: 27-Jul-1961 Today's Date: 01/24/2023   History of Present Illness 62 y.o. male presents to [facility] on 01/23/2023 for elective L TKA. PMH includes diverticulosis, ED, herpes simplex, HLD, HTN.   Clinical Impression   Pt admitted for above dx, PTA pt was ind in functional mobility and iADLs and he lived alone. Upon DC pt will have help for the first week of recovery. Pt currently completing functional mobility and ambulation with Supervision. Educated pt on the use of walls as UE support to complete tub transfer, pt successfully entered/exited tub using wall walk strategy with Min guard assist. Pt demonstrating ability to complete LBD with supervision as well. Pt would benefit from continued acute skilled OT services to further maximize independence with bADLs/iADLs, no follow-up OT recommended at this time.      Recommendations for follow up therapy are one component of a multi-disciplinary discharge planning process, led by the attending physician.  Recommendations may be updated based on patient status, additional functional criteria and insurance authorization.   Assistance Recommended at Discharge Set up Supervision/Assistance  Patient can return home with the following Assistance with cooking/housework;Assist for transportation    Functional Status Assessment  Patient has had a recent decline in their functional status and demonstrates the ability to make significant improvements in function in a reasonable and predictable amount of time.  Equipment Recommendations  None recommended by OT (Pt has rec DME)    Recommendations for Other Services       Precautions / Restrictions Precautions Precautions: Knee;Fall Precaution Booklet Issued: Yes (comment) Restrictions Weight Bearing Restrictions: Yes LLE Weight Bearing: Weight bearing as tolerated      Mobility Bed Mobility                General bed mobility comments: Pt rec'd and left sitting in recliner    Transfers Overall transfer level: Needs assistance Equipment used: Rolling walker (2 wheels) Transfers: Sit to/from Stand Sit to Stand: Supervision                  Balance       Sitting balance - Comments: in recliner, unable to fully assess   Standing balance support: Reliant on assistive device for balance, Single extremity supported Standing balance-Leahy Scale: Fair Standing balance comment: Static standing no AD                           ADL either performed or assessed with clinical judgement   ADL Overall ADL's : Needs assistance/impaired Eating/Feeding: Independent;Sitting   Grooming: Sitting;Set up   Upper Body Bathing: Sitting;Independent   Lower Body Bathing: Sitting/lateral leans;Set up;Supervison/ safety   Upper Body Dressing : Sitting;Independent   Lower Body Dressing: Sitting/lateral leans;Supervision/safety   Toilet Transfer: Ambulation;Rolling walker (2 wheels);Supervision/safety   Toileting- Architect and Hygiene: Sit to/from stand;Supervision/safety   Tub/ Shower Transfer: Nurse, adult (2 wheels) Tub/Shower Transfer Details (indicate cue type and reason): Walk walking for BUE support Functional mobility during ADLs: Supervision/safety;Rolling walker (2 wheels)       Vision         Perception     Praxis      Pertinent Vitals/Pain Pain Assessment Pain Assessment: No/denies pain     Hand Dominance     Extremity/Trunk Assessment Upper Extremity Assessment Upper Extremity Assessment: Overall WFL for tasks assessed   Lower Extremity Assessment LLE Deficits / Details: post-op  knee ROM deficits as expected s/p TKA, 3/5 knee extension, 4-/5 ankle DF/PD and hip flexion LLE Sensation: decreased light touch   Cervical / Trunk Assessment Cervical / Trunk Assessment: Normal   Communication Communication Communication: No  difficulties   Cognition Arousal/Alertness: Awake/alert Behavior During Therapy: WFL for tasks assessed/performed Overall Cognitive Status: Within Functional Limits for tasks assessed                                       General Comments  VSS on RA    Exercises     Shoulder Instructions      Home Living Family/patient expects to be discharged to:: Private residence Living Arrangements: Alone Available Help at Discharge: Family;Available 24 hours/day (for the 1st week) Type of Home: House Home Access: Stairs to enter [employer] of Steps: 3+1 Entrance Stairs-Rails: Can reach both Home Layout: One level;Laundry or work area in basement     [employer] Shower/Tub: Chief Strategy Officer: Standard (has sink counter to push off of if needed)     Home Equipment: Shower seat          Prior Functioning/Environment Prior Level of Function : Independent/Modified Independent;Working/employed;Driving             Mobility Comments: ind ADLs Comments: ind        OT Problem List: Impaired balance (sitting and/or standing);Pain      OT Treatment/Interventions: Self-care/ADL training;Balance training;DME and/or AE instruction;Therapeutic exercise;Therapeutic activities;Patient/family education    OT Goals(Current goals can be found in the care plan section) Acute Rehab OT Goals Patient Stated Goal: To go home OT Goal Formulation: With patient Time For Goal Achievement: 02/07/23 Potential to Achieve Goals: Good ADL Goals Pt Will Perform Grooming: standing;with modified independence Pt Will Transfer to Toilet: ambulating;with modified independence Pt Will Perform Tub/Shower Transfer: Tub transfer;ambulating;with modified independence  OT Frequency: Min 2X/week    Co-evaluation              AM-PAC OT "6 Clicks" Daily Activity     Outcome Measure Help from another person eating meals?: None Help from another person taking care  of personal grooming?: A Little Help from another person toileting, which includes using toliet, bedpan, or urinal?: A Little Help from another person bathing (including washing, rinsing, drying)?: A Little Help from another person to put on and taking off regular upper body clothing?: None Help from another person to put on and taking off regular lower body clothing?: A Little 6 Click Score: 20   End of Session Equipment Utilized During Treatment: Gait belt;Rolling walker (2 wheels) Nurse Communication: Mobility status  Activity Tolerance: Patient tolerated treatment well Patient left: in chair;with call bell/phone within reach;Other (comment) (with PT leading a tx session with pt)  OT Visit Diagnosis: Unsteadiness on feet (R26.81);Other abnormalities of gait and mobility (R26.89);Pain Pain - Right/Left: Left Pain - part of body: Knee                Time: 1610-9604 OT Time Calculation (min): 19 min Charges:  OT General Charges $OT Visit: 1 Visit OT Evaluation $OT Eval Low Complexity: 1 Low  01/24/2023  AB, OTR/L  Acute Rehabilitation Services  Office: [phone]   Tristan Schroeder 01/24/2023, 9:28 AM

## 2023-01-24 NOTE — Progress Notes (Signed)
Discharge instructions given. Patient verbalized understanding and all questions were answered.  ?

## 2023-01-24 NOTE — Discharge Summary (Signed)
Patient ID: James Bird MRN: [id number] DOB/AGE: 63-20-1962 63 y.o.  Admit date: 01/23/2023 Discharge date: 01/24/2023  Admission Diagnoses:  Principal Problem:   Primary osteoarthritis of left knee Active Problems:   Status post total left knee replacement   Discharge Diagnoses:  Same  Past Medical History:  Diagnosis Date   Allergy    RHINITS   Chronic shoulder pain    left   Constipation 2019   Diverticulosis    ED (erectile dysfunction)    Former smoker    quit late 2018   H/O echocardiogram 03/20/08   mild - moderate LVH, EF 50-55%, mild RV enlargement, mild mitral regurgitation, Dr. 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 Reyes Ivan   History of neck surgery    Hyperlipidemia    Hypertension    Impaired fasting glucose     Surgeries: Procedure(s): LEFT TOTAL KNEE ARTHROPLASTY on 01/23/2023   Consultants:   Discharged Condition: Improved  Hospital Course: Lem D Heagerty is an 62 y.o. male who was admitted 01/23/2023 for operative treatment ofPrimary osteoarthritis of left knee. Patient has severe unremitting pain that affects sleep, daily activities, and work/hobbies. After pre-op clearance the patient was taken to the operating room on 01/23/2023 and underwent  Procedure(s): LEFT TOTAL KNEE ARTHROPLASTY.    Patient was given perioperative antibiotics:  Anti-infectives (From admission, onward)    Start     Dose/Rate Route Frequency Ordered Stop   01/23/23 1600  ceFAZolin (ANCEF) IVPB 2g/100 mL premix        2 g 200 mL/hr over 30 Minutes Intravenous Every 6 hours 01/23/23 1357 01/23/23 2223   01/23/23 1157  vancomycin (VANCOCIN) powder  Status:  Discontinued          As needed 01/23/23 1157 01/23/23 1243   01/23/23 0745  ceFAZolin (ANCEF) IVPB 2g/100 mL premix        2 g 200 mL/hr over 30 Minutes Intravenous On call to O.R. 01/23/23 0743 01/23/23 1106         Patient was given sequential compression devices, early ambulation, and chemoprophylaxis to prevent DVT.  Patient benefited maximally from hospital stay and there were no complications.    Recent vital signs: Patient Vitals for the past 24 hrs:  BP Temp Temp src Pulse Resp SpO2 Height Weight  01/24/23 0724 (!) 165/101 98.3 F (36.8 C) Oral 69 18 100 % -- --  01/24/23 0426 (!) 157/96 (!) 97.5 F (36.4 C) -- 70 18 97 % -- --  01/24/23 0007 (!) 150/89 97.8 F (36.6 C) -- 65 18 100 % -- --  01/23/23 2007 (!) 156/96 97.8 F (36.6 C) -- 76 17 95 % -- --  01/23/23 1357 (!) 157/89 (!) 97.5 F (36.4 C) Oral (!) 56 -- 98 % -- --  01/23/23 1355 (!) 157/89 (!) 97.5 F (36.4 C) Axillary 63 14 98 % -- --  01/23/23 1330 (!) 144/92 97.9 F (36.6 C) -- 61 12 98 % -- --  01/23/23 1315 137/83 -- -- 71 13 96 % -- --  01/23/23 1300 132/81 -- -- 75 19 95 % -- --  01/23/23 1248 (!) 140/97 97.8 F (36.6 C) -- 69 17 99 % -- --  01/23/23 0905 (!) 152/77 -- -- 69 (!) 23 98 % -- --  01/23/23 0900 (!) 168/92 -- -- 72 (!) 27 99 % -- --  01/23/23 0855 (!) 163/105 -- -- 68 15 99 % -- --  01/23/23 0802 (!) 185/93 98.7 F (37.1 C) Oral 86 17 97 % 5\' 11"  (1.803 m) 102.1 kg     Recent laboratory studies:  Recent Labs    01/24/23 0204  WBC 12.9*  HGB 13.2  HCT 37.9*  PLT 210     Discharge Medications:   Allergies as of 01/24/2023   No Known Allergies      Medication List     TAKE these medications    aspirin EC 81 MG tablet Take 1 tablet (81 mg total) by mouth 2 (two) times daily. To  be taken after surgery to prevent blood clots   atorvastatin 20 MG tablet Commonly known as: LIPITOR TAKE 1 TABLET(20 MG) BY MOUTH DAILY   docusate sodium 100 MG capsule Commonly known as: Colace Take 1 capsule (100 mg total) by mouth daily as needed.   doxylamine (Sleep) 25 MG tablet Commonly known as: UNISOM Take 12.5 mg by mouth at bedtime as needed for sleep.   linaclotide 72 MCG  capsule Commonly known as: Linzess Take 1 capsule (72 mcg total) by mouth daily before breakfast. What changed:  when to take this reasons to take this   lisinopril-hydrochlorothiazide 20-12.5 MG tablet Commonly known as: ZESTORETIC TAKE 1 TABLET BY MOUTH DAILY   methocarbamol 750 MG tablet Commonly known as: Robaxin-750 Take 1 tablet (750 mg total) by mouth 2 (two) times daily as needed for muscle spasms.   ondansetron 4 MG tablet Commonly known as: Zofran Take 1 tablet (4 mg total) by mouth every 8 (eight) hours as needed for nausea or vomiting.   oxyCODONE-acetaminophen 5-325 MG tablet Commonly known as: Percocet Take 1-2 tablets by mouth every 6 (six) hours as needed. To be taken after surgery   tadalafil 20 MG tablet Commonly known as: Cialis Take 1 tablet (20 mg total) by mouth daily as needed for erectile dysfunction.   valACYclovir 1000 MG tablet Commonly known as: Valtrex Take 1 tablet (1,000 mg total) by mouth 2 (two) times daily. What changed:  when to take this reasons to take this   Vitamin D3 1.25 MG (50000 UT) Tabs Take 1 tablet by mouth once a week.               Durable Medical Equipment  (From admission, onward)           Start     Ordered   01/23/23 1358  DME Walker rolling  Once       Question Answer Comment  Walker: With 5 Inch Wheels   Patient needs a walker to treat with the following condition Status post left partial knee replacement      01/23/23 1357   01/23/23 1358  DME 3 n 1  Once        01/23/23 1357   01/23/23 1358  DME Bedside commode  Once       Question:  Patient needs a bedside commode to treat with the following condition  Answer:  Status post left partial knee replacement   01/23/23 1357            Diagnostic Studies: DG Knee Left Port  Result Date: 01/23/2023 CLINICAL DATA:  Postop knee replacement. EXAM: PORTABLE LEFT KNEE - 1-2 VIEW COMPARISON:  None Available. FINDINGS: Left knee arthroplasty in expected  alignment. No periprosthetic lucency or fracture. Recent postsurgical change includes air and edema in the soft tissues and joint space. IMPRESSION:  Left knee arthroplasty without immediate postoperative complication. Electronically Signed   By: Narda Rutherford M.D.   On: 01/23/2023 13:05    Disposition: Discharge disposition: 01-Home or Self Care          Follow-up Information     Tarry Kos, MD. Schedule an appointment as soon as possible for a visit in 2 week(s).   Specialty: Orthopedic Surgery Contact information: [address] [place] [address] [phone]         Ronnald Nian, MD Follow up.   Specialty: Family Medicine Contact information: [address] [place] [address] [phone]         [facility] Follow up.   Specialty: Home Health Services Why: home health services will be provided by [facility], start of care within 48 hrs. Contact information: [address] [place] [address] [phone]                  Signed: Cristie Hem 01/24/2023, 7:58 AM

## 2023-01-24 NOTE — Progress Notes (Signed)
Physical Therapy Treatment Patient Details Name: James Bird MRN: [id number] DOB: 30-May-1961 Today's Date: 01/24/2023   History of Present Illness 62 y.o. male presents to [facility] hospital on 01/23/2023 for elective L TKA. PMH includes diverticulosis, ED, herpes simplex, HLD, HTN.    PT Comments    Pt received sitting EOB and agreeable to session. Pt able to don clothes in sitting and standing with supervision and no unsteadiness. Pt able to tolerate increased gait distance demonstrating good stability with RW support. All pt's questions regarding activity progression answered. Anticipate pt and family will be able to manage pt's mobility needs at home.   Recommendations for follow up therapy are one component of a multi-disciplinary discharge planning process, led by the attending physician.  Recommendations may be updated based on patient status, additional functional criteria and insurance authorization.     Assistance Recommended at Discharge PRN  Patient can return home with the following A little help with bathing/dressing/bathroom;Assistance with cooking/housework;Assist for transportation;Help with stairs or ramp for entrance   Equipment Recommendations  Rolling walker (2 wheels)    Recommendations for Other Services       Precautions / Restrictions Precautions Precautions: Knee;Fall Precaution Booklet Issued: Yes (comment) Restrictions Weight Bearing Restrictions: Yes LLE Weight Bearing: Weight bearing as tolerated     Mobility  Bed Mobility Overal bed mobility: Needs Assistance Bed Mobility: Sit to Supine       Sit to supine: Supervision   General bed mobility comments: Increased time    Transfers Overall transfer level: Needs assistance Equipment used: Rolling walker (2 wheels) Transfers: Sit to/from Stand Sit to Stand: Supervision           General transfer comment: from low EOB and recliner    Ambulation/Gait Ambulation/Gait assistance:  Supervision Gait Distance (Feet): 350 Feet Assistive device: Rolling walker (2 wheels) Gait Pattern/deviations: Step-through pattern, Trunk flexed, Decreased stance time - left, Antalgic Gait velocity: reduced     General Gait Details: Pt demonstrating steady, slightly antalgic gait with cues for upright posture.       Balance Overall balance assessment: Needs assistance Sitting-balance support: No upper extremity supported, Feet supported Sitting balance-Leahy Scale: Good Sitting balance - Comments: sitting EOB   Standing balance support: Bilateral upper extremity supported, During functional activity Standing balance-Leahy Scale: Fair Standing balance comment: with RW support. Pt able to static stand to don shirt and move bags on the bed with supervision                            Cognition Arousal/Alertness: Awake/alert Behavior During Therapy: [facility] for tasks assessed/performed Overall Cognitive Status: Within Functional Limits for tasks assessed                                          Exercises      General Comments      Pertinent Vitals/Pain Pain Assessment Pain Assessment: No/denies pain     PT Goals (current goals can now be found in the care plan section) Acute Rehab PT Goals Patient Stated Goal: to return to independence PT Goal Formulation: With patient Time For Goal Achievement: 01/27/23 Potential to Achieve Goals: Good Progress towards PT goals: Progressing toward goals    Frequency    7X/week      PT Plan Current plan remains appropriate  AM-PAC PT "6 Clicks" Mobility   Outcome Measure  Help needed turning from your back to your side while in a flat bed without using bedrails?: None Help needed moving from lying on your back to sitting on the side of a flat bed without using bedrails?: None Help needed moving to and from a bed to a chair (including a wheelchair)?: A Little Help needed standing up from a  chair using your arms (e.g., wheelchair or bedside chair)?: A Little Help needed to walk in hospital room?: A Little Help needed climbing 3-5 steps with a railing? : A Little 6 Click Score: 20    End of Session Equipment Utilized During Treatment: Gait belt Activity Tolerance: Patient tolerated treatment well Patient left: with call bell/phone within reach;in bed Nurse Communication: Mobility status PT Visit Diagnosis: Other abnormalities of gait and mobility (R26.89);Muscle weakness (generalized) (M62.81)     Time: 4098-1191 PT Time Calculation (min) (ACUTE ONLY): 22 min  Charges:  $Gait Training: 8-22 mins                     Johny Shock, PTA Acute Rehabilitation Services Secure Chat Preferred  [phone]    Johny Shock 01/24/2023, 11:11 AM

## 2023-01-25 ENCOUNTER — Telehealth: Payer: Self-pay

## 2023-01-25 ENCOUNTER — Telehealth: Payer: Self-pay | Admitting: Physician Assistant

## 2023-01-25 NOTE — Transitions of Care (Post Inpatient/ED Visit) (Signed)
01/25/2023  Name: James Bird MRN: [id number] DOB: [birth year]  Today's TOC FU Call Status: Today's TOC FU Call Status:: Successful TOC FU Call Competed TOC FU Call Complete Date: 01/25/23  Transition Care Management Follow-up Telephone Call Date of Discharge: 01/24/23 Discharge Facility: [facility]) Type of Discharge: Inpatient Admission Primary Inpatient Discharge Diagnosis:: Primary osteoarthritis of left knee How have you been since you were released from the hospital?: Better Any questions or concerns?: Yes  Items Reviewed: Did you receive and understand the discharge instructions provided?: Yes Medications obtained,verified, and reconciled?: Yes (Medications Reviewed) Any new allergies since your discharge?: No Dietary orders reviewed?: Yes Do you have support at home?: Yes  Medications Reviewed Today: Medications Reviewed Today     Reviewed by Merleen Nicely, LPN (Licensed Practical Nurse) on 01/25/23 at 1412  Med List Status: <None>   Medication Order Taking? Sig Documenting Provider Last Dose Status Informant  0.9 %  sodium chloride infusion [id number]   Hilarie Fredrickson, MD  Active   aspirin EC 81 MG tablet [id number] Yes Take 1 tablet (81 mg total) by mouth 2 (two) times daily. To  be taken after surgery to prevent blood clots Cristie Hem, PA-C Taking Active   atorvastatin (LIPITOR) 20 MG tablet [id number] Yes TAKE 1 TABLET(20 MG) BY MOUTH DAILY Ronnald Nian, MD Taking Active Self  Cholecalciferol (VITAMIN D3) 1.25 MG (50000 UT) TABS [id number] Yes Take 1 tablet by mouth once a week. Tollie Eth, NP Taking Active Self           Med Note Joella Prince A   Thu Jan 12, 2023  1:16 PM) Saturdays  docusate sodium (COLACE) 100 MG capsule [id number] Yes Take 1 capsule (100 mg total) by mouth daily as needed. Cristie Hem, PA-C Taking Active   doxylamine, Sleep, (UNISOM) 25 MG tablet [id number] Yes Take 12.5 mg by mouth at bedtime as needed for sleep. [provider] Taking Active Self  linaclotide Karlene Einstein) 72 MCG capsule [id number] Yes Take 1 capsule (72 mcg total) by mouth daily before breakfast.  Patient taking differently: Take 72 mcg by mouth daily as needed (constipation).   Ronnald Nian, MD Taking Active Self           Med Note Joella Prince A   Thu Jan 12, 2023  1:13 PM)    lisinopril-hydrochlorothiazide (ZESTORETIC) 20-12.5 MG tablet [id number] Yes TAKE 1 TABLET BY MOUTH DAILY Ronnald Nian, MD Taking Active Self  methocarbamol (ROBAXIN-750) 750 MG tablet [id number] Yes Take 1 tablet (750 mg total) by mouth 2 (two) times daily as needed for muscle spasms. Cristie Hem, PA-C Taking Active   ondansetron (ZOFRAN) 4 MG tablet [id number] Yes Take 1 tablet (4 mg total) by mouth every 8 (eight) hours as needed for nausea or vomiting. Cristie Hem, PA-C Taking Active   oxyCODONE-acetaminophen (PERCOCET) 5-325 MG tablet [id number] Yes Take 1-2 tablets by mouth every 6 (six) hours as needed. To be taken after surgery Cristie Hem, PA-C Taking Active   tadalafil (CIALIS) 20 MG tablet [id number] Yes Take 1 tablet (20 mg total) by mouth daily as needed for erectile dysfunction. Ronnald Nian, MD Taking Active Self           Med Note Sherrie Mustache, [place] A   Thu Jan 12, 2023  1:19 PM)    valACYclovir (VALTREX) 1000 MG tablet [id number] Yes Take 1 tablet (1,000 mg total) by mouth 2 (two) times daily.  Patient taking differently: Take 1,000 mg by mouth 2 (two) times daily as needed (herpes).   Ronnald Nian, MD Taking Active Self           Med Note Joella Prince A   Thu Jan 12, 2023  1:19 PM)              Home Care and Equipment/Supplies: Were Home Health Services Ordered?: Yes Name of Home Health Agency:: [facility] Has Agency set up a time to come to your home?: Yes First Home Health Visit Date: 01/25/23 Any new equipment or medical supplies ordered?: Yes Name of Medical supply agency?: hospital Were you able to get  the equipment/medical supplies?: Yes (walker /BSC) Do you have any questions related to the use of the equipment/supplies?: No  Functional Questionnaire: Do you need assistance with bathing/showering or dressing?: Yes (assistancce) Do you need assistance with meal preparation?: Yes Do you need assistance with eating?: No Do you have difficulty maintaining continence: No Do you need assistance with getting out of bed/getting out of a chair/moving?: Yes Do you have difficulty managing or taking your medications?: No  Follow up appointments reviewed: PCP Follow-up appointment confirmed?: No MD Provider Line Number:[phone] Given: Yes Specialist Hospital Follow-up appointment confirmed?: Yes Date of Specialist follow-up appointment?: 02/07/23 Follow-Up Specialty Provider:: Dr Andria Frames Do you need transportation to your follow-up appointment?: No Do you understand care options if your condition(s) worsen?: Yes-patient verbalized understanding    SIGNATURE  Woodfin Ganja LPN [facility] Nurse Health Advisor Direct Dial [phone]

## 2023-01-25 NOTE — Telephone Encounter (Signed)
James Bird (PT) from [facility] called requesting verbal orders for 2wk1, 3wk 1, and 2wk. Please call Jae Dire on secure line at [phone].

## 2023-01-25 NOTE — Telephone Encounter (Signed)
Called and gave verbal via voicemail.  

## 2023-01-30 ENCOUNTER — Telehealth: Payer: Self-pay

## 2023-01-30 NOTE — Telephone Encounter (Signed)
I some how overlooked that one. I will send in order today.

## 2023-01-30 NOTE — Telephone Encounter (Signed)
Revonda Standard, PT with [facility] called stating that patient has some redness, warmness, and increased drainage in left knee.  Stated that his pain has increased to an 8.  Patient had left knee surgery on 01/23/23.  CB# [phone].  Please advise.  Thank you.

## 2023-01-31 ENCOUNTER — Other Ambulatory Visit: Payer: Self-pay | Admitting: Physician Assistant

## 2023-01-31 ENCOUNTER — Ambulatory Visit (INDEPENDENT_AMBULATORY_CARE_PROVIDER_SITE_OTHER): Payer: BC Managed Care – PPO | Admitting: Physician Assistant

## 2023-01-31 DIAGNOSIS — Z96652 Presence of left artificial knee joint: Secondary | ICD-10-CM

## 2023-01-31 MED ORDER — CEFADROXIL 500 MG PO CAPS: 500.0000 mg | ORAL_CAPSULE | Freq: Two times a day (BID) | ORAL | 0 refills | Status: DC

## 2023-01-31 MED ORDER — OXYCODONE-ACETAMINOPHEN 5-325 MG PO TABS: 1.0000 | ORAL_TABLET | Freq: Four times a day (QID) | ORAL | 0 refills | Status: DC | PRN

## 2023-01-31 NOTE — Progress Notes (Signed)
Post-Op Visit Note   Patient: James Bird           Date of Birth: [birth year]           MRN: [id number] Visit Date: 01/31/2023 PCP: Ronnald Nian, MD   Assessment & Plan:  Chief Complaint:  Chief Complaint  Patient presents with   Left Knee - Routine Post Op   Visit Diagnoses:  1. Status post total left knee replacement     Plan: Patient is a pleasant 62 year old gentleman who comes in today 8 days status post left total knee replacement for 1024.  He is here today with increased pain, swelling and drainage.  He has been getting home health physical therapy and is ambulating with a walker.  He is unsure whether he possibly overdid it over the weekend.  He has been wearing his compression socks.  No fevers or chills or any other systemic symptoms.  He has been compliant taking baby aspirin twice daily for DVT prophylaxis.  Examination of his left knee reveals a well healing surgical incision.  There is slight bloody drainage to the distal aspect.  No drainage was able to be expressed.  No induration.  Minimal erythema.  Moderate swelling to the left lower extremity.  Calf is soft and nontender.  He is able to plantarflex and dorsiflex his ankle.  He is neurovascularly intact distally.  Today, the wound was cleaned with Betadine and recovered with Aquacel.  I have started him on cefadroxil for infection prophylaxis.  Continue taking his baby aspirin twice daily for DVT prophylaxis.  I refilled his Percocet.  He will follow-up with [place] next week for recheck.  He will call us with concerns or questions in the meantime.  Follow-Up Instructions: Return in about 1 week (around 02/07/2023).   Orders:  No orders of the defined types were placed in this encounter.  No orders of the defined types were placed in this encounter.   Imaging: No new imaging  PMFS History: Patient Active Problem List   Diagnosis Date Noted   Status post total left knee replacement 01/23/2023   History of  smoking 10-25 pack years 08/24/2021   History of colonic polyps 08/24/2021   H/O cervical discectomy 08/24/2021   Constipation 04/16/2020   Family history of heart disease 02/21/2019   Paresthesia 05/24/2018   Elevated PSA 05/24/2018   Nail deformity 05/24/2018   Onychomycosis 05/24/2018   Tinea pedis 05/24/2018   Lumbar radiculopathy 04/25/2018   Primary osteoarthritis of left knee 03/29/2017   Clonus 03/29/2017   Right foot drop 03/29/2017   Routine general medical examination at a health care facility 12/14/2015   Obesity 12/11/2014   Hyperlipidemia 11/28/2012   Erectile dysfunction 11/28/2012   Essential hypertension, benign 11/02/2011   Impaired fasting blood sugar 11/02/2011   Past Medical History:  Diagnosis Date   Allergy    RHINITS   Chronic shoulder pain    left   Constipation 2019   Diverticulosis    ED (erectile dysfunction)    Former smoker    quit late 2018   H/O echocardiogram 03/20/08   mild - moderate LVH, EF 50-55%, mild RV enlargement, mild mitral regurgitation, Dr. 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 Reyes Ivan   History of neck surgery    Hyperlipidemia    Hypertension    Impaired  fasting glucose     Family History  Problem Relation Age of Onset   Cancer Mother        LUNG   Heart disease Father 47       MI   Cancer Paternal Aunt        type unknown   Cancer Paternal Uncle        type unknown   Hypertension Brother    Diabetes Neg Hx    Stroke Neg Hx    Colon cancer Neg Hx    Esophageal cancer Neg Hx    Pancreatic cancer Neg Hx    Stomach cancer Neg Hx    Rectal cancer Neg Hx     Past Surgical History:  Procedure Laterality Date   COLONOSCOPY  10/2010   diverticulosis, othewrise normal, repeat 10 years, Dr. Marina Goodell   SPINE SURGERY  2011(JENKINS,MD)   CERVICAL DISC AND FUSION   TOTAL KNEE ARTHROPLASTY Left 01/23/2023   Procedure:  LEFT TOTAL KNEE ARTHROPLASTY;  Surgeon: Tarry Kos, MD;  Location: [facility] OR;  Service: Orthopedics;  Laterality: Left;   Social History   Occupational History   Occupation: Therapist, nutritional: [employer]  Tobacco Use   Smoking status: Former    Packs/day: 0.50    Years: 16.00    Additional pack years: 0.00    Total pack years: 8.00    Types: Cigarettes    Quit date: 05/15/2017    Years since quitting: 5.7   Smokeless tobacco: Never  Vaping Use   Vaping Use: Never used  Substance and Sexual Activity   Alcohol use: Yes    Alcohol/week: 10.0 standard drinks of alcohol    Types: 10 Cans of beer per week    Comment: 10 drinks per week, sometimes more   Drug use: Not Currently    Comment: 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

## 2023-02-03 ENCOUNTER — Telehealth: Payer: Self-pay

## 2023-02-03 NOTE — Telephone Encounter (Signed)
Called patient

## 2023-02-03 NOTE — Telephone Encounter (Signed)
Revonda Standard, PT with [facility] called stating that patient has had non stop drainage and that the last bandage has been saturated as well.  Per Revonda Standard, please call patient.  Cb# [phone].  Please advise.  Thank you.

## 2023-02-06 ENCOUNTER — Other Ambulatory Visit: Payer: Self-pay | Admitting: Orthopaedic Surgery

## 2023-02-06 MED ORDER — OXYCODONE-ACETAMINOPHEN 5-325 MG PO TABS: 1.0000 | ORAL_TABLET | Freq: Every day | ORAL | 0 refills | Status: DC | PRN

## 2023-02-07 ENCOUNTER — Ambulatory Visit (INDEPENDENT_AMBULATORY_CARE_PROVIDER_SITE_OTHER): Payer: BC Managed Care – PPO | Admitting: Physician Assistant

## 2023-02-07 ENCOUNTER — Telehealth: Payer: Self-pay | Admitting: Physician Assistant

## 2023-02-07 ENCOUNTER — Telehealth: Payer: Self-pay

## 2023-02-07 ENCOUNTER — Encounter: Payer: Self-pay | Admitting: Family Medicine

## 2023-02-07 ENCOUNTER — Encounter: Payer: Self-pay | Admitting: Physician Assistant

## 2023-02-07 ENCOUNTER — Other Ambulatory Visit: Payer: Self-pay | Admitting: Physician Assistant

## 2023-02-07 DIAGNOSIS — Z96652 Presence of left artificial knee joint: Secondary | ICD-10-CM

## 2023-02-07 MED ORDER — CEFADROXIL 500 MG PO CAPS: 500.0000 mg | ORAL_CAPSULE | Freq: Two times a day (BID) | ORAL | 0 refills | Status: DC

## 2023-02-07 NOTE — Telephone Encounter (Signed)
Called pharm received a PA for the patient to get his medication. PA number is [phone]

## 2023-02-07 NOTE — Telephone Encounter (Signed)
[facility] is still stating they do not have Auth for Rx to be picked up

## 2023-02-07 NOTE — Progress Notes (Signed)
Post-Op Visit Note   Patient: James Bird           Date of Birth: 06/07/1961           MRN: [id number] Visit Date: 02/07/2023 PCP: Ronnald Nian, MD   Assessment & Plan:  Chief Complaint:  Chief Complaint  Patient presents with   Left Knee - Follow-up    Left total knee arthroplasty 01/23/2023   Visit Diagnoses:  1. Status post total left knee replacement     Plan: Patient is a pleasant 62 year old gentleman who comes in today 2 weeks status post left total knee replacement 01/23/2023.  He was seen in our office about a week ago with increased drainage to the distal incision.  He was started on cefadroxil for which she is still taking.  He has been doing dressing changes as needed.  He was back down from taking a baby aspirin twice daily to once daily starting last Friday.  He denies any fevers or chills or any other systemic symptoms.  He is getting home health physical therapy and is ambulating with a walker.  He is taking oxycodone for pain.  Examination of the left knee reveals a well-healed surgical incision without drainage.  No evidence of infection or cellulitis.  Calf is soft nontender.  He is neurovascular intact distally.  Today, his wound was cleaned with Betadine and Steri-Strips were applied.  We have extended his cefadroxil for an additional 2 weeks.  I have sent in a referral for outpatient physical therapy.  Postop instructions handout provided.  He will follow-up with [place] in 4 weeks for repeat evaluation and 2 view x-rays of the left knee.  Should he have any concerns or questions in the meantime he will call us and let us know.  Follow-Up Instructions: Return in about 4 weeks (around 03/07/2023).   Orders:  No orders of the defined types were placed in this encounter.  No orders of the defined types were placed in this encounter.   Imaging: No new imaging  PMFS History: Patient Active Problem List   Diagnosis Date Noted   Status post total left knee  replacement 01/23/2023   History of smoking 10-25 pack years 08/24/2021   History of colonic polyps 08/24/2021   H/O cervical discectomy 08/24/2021   Constipation 04/16/2020   Family history of heart disease 02/21/2019   Paresthesia 05/24/2018   Elevated PSA 05/24/2018   Nail deformity 05/24/2018   Onychomycosis 05/24/2018   Tinea pedis 05/24/2018   Lumbar radiculopathy 04/25/2018   Primary osteoarthritis of left knee 03/29/2017   Clonus 03/29/2017   Right foot drop 03/29/2017   Routine general medical examination at a health care facility 12/14/2015   Obesity 12/11/2014   Hyperlipidemia 11/28/2012   Erectile dysfunction 11/28/2012   Essential hypertension, benign 11/02/2011   Impaired fasting blood sugar 11/02/2011   Past Medical History:  Diagnosis Date   Allergy    RHINITS   Chronic shoulder pain    left   Constipation 2019   Diverticulosis    ED (erectile dysfunction)    Former smoker    quit late 2018   H/O echocardiogram 03/20/08   mild - moderate LVH, EF 50-55%, mild RV enlargement, mild mitral regurgitation, Dr. 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 Reyes Ivan   History of neck surgery  Hyperlipidemia    Hypertension    Impaired fasting glucose     Family History  Problem Relation Age of Onset   Cancer Mother        LUNG   Heart disease Father 29       MI   Cancer Paternal Aunt        type unknown   Cancer Paternal Uncle        type unknown   Hypertension Brother    Diabetes Neg Hx    Stroke Neg Hx    Colon cancer Neg Hx    Esophageal cancer Neg Hx    Pancreatic cancer Neg Hx    Stomach cancer Neg Hx    Rectal cancer Neg Hx     Past Surgical History:  Procedure Laterality Date   COLONOSCOPY  10/2010   diverticulosis, othewrise normal, repeat 10 years, Dr. Marina Goodell   SPINE SURGERY  2011(JENKINS,MD)   CERVICAL DISC AND FUSION   TOTAL KNEE  ARTHROPLASTY Left 01/23/2023   Procedure: LEFT TOTAL KNEE ARTHROPLASTY;  Surgeon: Tarry Kos, MD;  Location: [facility] OR;  Service: Orthopedics;  Laterality: Left;   Social History   Occupational History   Occupation: Therapist, nutritional: [employer]  Tobacco Use   Smoking status: Former    Packs/day: 0.50    Years: 16.00    Additional pack years: 0.00    Total pack years: 8.00    Types: Cigarettes    Quit date: 05/15/2017    Years since quitting: 5.7   Smokeless tobacco: Never  Vaping Use   Vaping Use: Never used  Substance and Sexual Activity   Alcohol use: Yes    Alcohol/week: 10.0 standard drinks of alcohol    Types: 10 Cans of beer per week    Comment: 10 drinks per week, sometimes more   Drug use: Not Currently    Comment: 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

## 2023-02-08 MED ORDER — LINACLOTIDE 145 MCG PO CAPS: 145.0000 ug | ORAL_CAPSULE | Freq: Every day | ORAL | 0 refills | Status: DC

## 2023-02-09 ENCOUNTER — Encounter: Payer: Self-pay | Admitting: Rehabilitative and Restorative Service Providers"

## 2023-02-09 ENCOUNTER — Ambulatory Visit: Payer: BC Managed Care – PPO | Admitting: Rehabilitative and Restorative Service Providers"

## 2023-02-09 ENCOUNTER — Other Ambulatory Visit: Payer: Self-pay

## 2023-02-09 DIAGNOSIS — R600 Localized edema: Secondary | ICD-10-CM

## 2023-02-09 DIAGNOSIS — M6281 Muscle weakness (generalized): Secondary | ICD-10-CM | POA: Diagnosis not present

## 2023-02-09 DIAGNOSIS — M25562 Pain in left knee: Secondary | ICD-10-CM

## 2023-02-09 DIAGNOSIS — G8929 Other chronic pain: Secondary | ICD-10-CM

## 2023-02-09 DIAGNOSIS — M25662 Stiffness of left knee, not elsewhere classified: Secondary | ICD-10-CM | POA: Diagnosis not present

## 2023-02-09 DIAGNOSIS — R262 Difficulty in walking, not elsewhere classified: Secondary | ICD-10-CM | POA: Diagnosis not present

## 2023-02-09 NOTE — Therapy (Signed)
OUTPATIENT PHYSICAL THERAPY EVALUATION   Patient Name: James Bird MRN: [id number] DOB:1961-02-19, 62 y.o., male Today's Date: 02/09/2023  END OF SESSION:  PT End of Session - 02/09/23 1138     Visit Number 1    Number of Visits 20    Date for PT Re-Evaluation 04/20/23    Authorization Type [employer] state $52 copay    Progress Note Due on Visit 10    PT Start Time 1145    PT Stop Time 1225    PT Time Calculation (min) 40 min    Activity Tolerance Patient limited by fatigue;Patient limited by pain    Behavior During Therapy [facility] for tasks assessed/performed             Past Medical History:  Diagnosis Date   Allergy    RHINITS   Chronic shoulder pain    left   Constipation 2019   Diverticulosis    ED (erectile dysfunction)    Former smoker    quit late 2018   H/O echocardiogram 03/20/08   mild - moderate LVH, EF 50-55%, mild RV enlargement, mild mitral regurgitation, Dr. 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 Reyes Ivan   History of neck surgery    Hyperlipidemia    Hypertension    Impaired fasting glucose    Past Surgical History:  Procedure Laterality Date   COLONOSCOPY  10/2010   diverticulosis, othewrise normal, repeat 10 years, Dr. Marina Goodell   SPINE SURGERY  2011(JENKINS,MD)   CERVICAL DISC AND FUSION   TOTAL KNEE ARTHROPLASTY Left 01/23/2023   Procedure: LEFT TOTAL KNEE ARTHROPLASTY;  Surgeon: Tarry Kos, MD;  Location: [facility] OR;  Service: Orthopedics;  Laterality: Left;   Patient Active Problem List   Diagnosis Date Noted   Status post total left knee replacement 01/23/2023   History of smoking 10-25 pack years 08/24/2021   History of colonic polyps 08/24/2021   H/O cervical discectomy 08/24/2021   Constipation 04/16/2020   Family history of heart disease 02/21/2019   Paresthesia 05/24/2018   Elevated PSA 05/24/2018   Nail deformity 05/24/2018    Onychomycosis 05/24/2018   Tinea pedis 05/24/2018   Lumbar radiculopathy 04/25/2018   Primary osteoarthritis of left knee 03/29/2017   Clonus 03/29/2017   Right foot drop 03/29/2017   Routine general medical examination at a health care facility 12/14/2015   Obesity 12/11/2014   Hyperlipidemia 11/28/2012   Erectile dysfunction 11/28/2012   Essential hypertension, benign 11/02/2011   Impaired fasting blood sugar 11/02/2011    PCP: Ronnald Nian MD  REFERRING PROVIDER: Cristie Hem, PA-C  REFERRING DIAG: [phone] (ICD-10-CM) - Status post total left knee replacement  THERAPY DIAG:  Chronic pain of left knee  Muscle weakness (generalized)  Stiffness of left knee, not elsewhere classified  Difficulty in walking, not elsewhere classified  Localized edema  Rationale for Evaluation and Treatment: Rehabilitation  ONSET DATE: Surgery 01/23/2023  SUBJECTIVE:   SUBJECTIVE STATEMENT: Pt came to clinic s/p recent Lt TKA on 01/23/2023.  Pt indicated progressive worsening over years.  Pt indicated variable symptoms up to 9/10.  Reported variable sleeping from 2-3 to 5-6 hours at a time.  Sleeping in bed.   Has CPM machine.   Had home health PT.    Pt indicated still having endurance issues with activity.   PERTINENT HISTORY: PMH includes diverticulosis, ED,  herpes simplex, HLD, HTN.  PAIN:  NPRS scale: at worst 9/10, at current 7/10, at best 2/10 Pain location: Lt knee  Pain description: stiffness, achy Aggravating factors: static positioning, WB activity, end ranges Relieving factors: moving around, ice, stretching, medicine  PRECAUTIONS: None  WEIGHT BEARING RESTRICTIONS: No  FALLS:  Has patient fallen in last 6 months? No  LIVING ENVIRONMENT: Lives with: lives alone Lives in: House/apartment Stairs: has stairs for laundry.  3 stairs with rails Has following equipment at home: FWW,   OCCUPATION: Database admin.  Computer work.    PLOF: Independent, racquet  ball  PATIENT GOALS: Reduce pain  OBJECTIVE:   PATIENT SURVEYS:  02/09/2023 FOTO intake:  36  predicted:  59  COGNITION: 02/09/2023 Overall cognitive status: WFL    SENSATION: 02/09/2023 No specific testing today  EDEMA:  02/09/2023 Localized edema noted Lt knee/Lt lower leg  MUSCLE LENGTH: 02/09/2023 No specific testing today  POSTURE:  02/09/2023 Difficulty in sitting, lying down tolerance to static postures.    PALPATION: 02/09/2023 Incision  LOWER EXTREMITY ROM:   ROM Right 02/09/2023 Left 02/09/2023  Hip flexion    Hip extension    Hip abduction    Hip adduction    Hip internal rotation    Hip external rotation    Knee flexion  86 AROM in supine heel slide  Knee extension  -28 AROM in seated LAQ  -7 In supine heel prop  Ankle dorsiflexion    Ankle plantarflexion    Ankle inversion    Ankle eversion     (Blank rows = not tested)  LOWER EXTREMITY MMT:  MMT Right 02/09/2023 Left 02/09/2023  Hip flexion 5/5 4/5  Hip extension    Hip abduction    Hip adduction    Hip internal rotation    Hip external rotation    Knee flexion 5/5 4/5  Knee extension 5/5 2+/5  Ankle dorsiflexion 5/5 5/5  Ankle plantarflexion    Ankle inversion    Ankle eversion     (Blank rows = not tested)  LOWER EXTREMITY SPECIAL TESTS:  02/09/2023 No specific testing   FUNCTIONAL TESTS:  02/09/2023 18 inch chair transfer: unable s UE assist  GAIT: 02/09/2023 Modified independent ambulation c FWW with reduced stance on Lt leg, reduce toe off progression, maintained knee flexion in stance, lacking full TKE                                                                                                                                                                        TODAY'S TREATMENT  DATE: 02/09/2023 Therex:    HEP instruction/performance c cues for techniques, handout provided.  Trial set performed  of each for comprehension and symptom assessment.  See below for exercise list  Vasopnuematic Device Elevation Lt leg 10 mins medium compression 34 deg  PATIENT EDUCATION:  02/09/2023 Education details: HEP, POC Person educated: Patient Education method: Programmer, multimedia, Demonstration, Verbal cues, and Handouts Education comprehension: verbalized understanding, returned demonstration, and verbal cues required  HOME EXERCISE PROGRAM: Access Code: [email or web address] URL: [email or web address] Date: 02/09/2023 Prepared by: Chyrel Masson  Exercises - Supine Heel Slide (Mirrored)  - 3-5 x daily - 7 x weekly - 1 sets - 10 reps - 5 hold - Seated Long Arc Quad (Mirrored)  - 3-5 x daily - 7 x weekly - 1 sets - 5-10 reps - 2 hold - Supine Knee Extension Mobilization with Weight (Mirrored)  - 4-5 x daily - 7 x weekly - 1 sets - 1 reps - to tolerance up to 15 mins hold - Supine Quadricep Sets  - 3-5 x daily - 7 x weekly - 1 sets - 10 reps - 5 hold - Seated Quad Set (Mirrored)  - 3-5 x daily - 7 x weekly - 1 sets - 10 reps - 5 hold  ASSESSMENT:  CLINICAL IMPRESSION: Patient is a 62 y.o. who comes to clinic with complaints of Lt knee pain s/p recent Lt TKA with mobility, strength and movement coordination deficits that impair their ability to perform usual daily and recreational functional activities without increase difficulty/symptoms at this time.  Patient to benefit from skilled PT services to address impairments and limitations to improve to previous level of function without restriction secondary to condition.   OBJECTIVE IMPAIRMENTS: Abnormal gait, decreased balance, decreased coordination, decreased endurance, decreased mobility, difficulty walking, decreased ROM, decreased strength, hypomobility, increased edema, increased fascial restrictions, impaired perceived functional ability, impaired flexibility, improper body mechanics, and pain.   ACTIVITY LIMITATIONS: carrying, lifting, bending,  sitting, standing, squatting, sleeping, stairs, transfers, bed mobility, dressing, and locomotion level  PARTICIPATION LIMITATIONS: cleaning, laundry, interpersonal relationship, driving, shopping, and community activity  PERSONAL FACTORS:  PMH includes diverticulosis, ED, herpes simplex, HLD, HTN.  are also affecting patient's functional outcome.   REHAB POTENTIAL: Good  CLINICAL DECISION MAKING: Stable/uncomplicated  EVALUATION COMPLEXITY: Low   GOALS: Goals reviewed with patient? Yes  SHORT TERM GOALS: (target date for Short term goals are 3 weeks 03/02/2023)   1.  Patient will demonstrate independent use of home exercise program to maintain progress from in clinic treatments.  Goal status: New  LONG TERM GOALS: (target dates for all long term goals are 10 weeks  04/20/2023 )   1. Patient will demonstrate/report pain at worst less than or equal to 2/10 to facilitate minimal limitation in daily activity secondary to pain symptoms.  Goal status: New   2. Patient will demonstrate independent use of home exercise program to facilitate ability to maintain/progress functional gains from skilled physical therapy services.  Goal status: New   3. Patient will demonstrate FOTO outcome > or = 59 % to indicate reduced disability due to condition.  Goal status: New   4.  Patient will demonstrate Lt LE MMT 5/5 throughout to faciltiate usual transfers, stairs, squatting at [facility] for daily life.   Goal status: New   5.  Patient will demonstrate independent ambulation community distances > 300 ft for usual mobility.  Goal status: New   6.  Patient will demonstrate ascending/descending stairs reciprocally s UE assist for community integration.  Goal status: New   7.  Patient will demonstrate Lt knee AROM 0-110 deg to facilitate transfers, ambulation and other daily activity at PLOF.  Goal Status: New   PLAN:  PT FREQUENCY: 1-3x/week  PT DURATION: 10 weeks  PLANNED  INTERVENTIONS: Therapeutic exercises, Therapeutic activity, Neuro Muscular re-education, Balance training, Gait training, Patient/Family education, Joint mobilization, Stair training, DME instructions, Dry Needling, Electrical stimulation, Traction, Cryotherapy, vasopneumatic deviceMoist heat, Taping, Ultrasound, Ionotophoresis 4mg /ml Dexamethasone, and aquatic therapy, Manual therapy.  All included unless contraindicated  PLAN FOR NEXT SESSION: Review HEP knowledge/results.  Progressive strengthening, mobility with manual and therex.    Chyrel Masson, PT, DPT, OCS, ATC 02/09/23  12:27 PM

## 2023-02-13 ENCOUNTER — Ambulatory Visit: Payer: BC Managed Care – PPO | Admitting: Physical Therapy

## 2023-02-13 ENCOUNTER — Encounter: Payer: Self-pay | Admitting: Physical Therapy

## 2023-02-13 DIAGNOSIS — G8929 Other chronic pain: Secondary | ICD-10-CM

## 2023-02-13 DIAGNOSIS — M25662 Stiffness of left knee, not elsewhere classified: Secondary | ICD-10-CM

## 2023-02-13 DIAGNOSIS — M25562 Pain in left knee: Secondary | ICD-10-CM | POA: Diagnosis not present

## 2023-02-13 DIAGNOSIS — R262 Difficulty in walking, not elsewhere classified: Secondary | ICD-10-CM | POA: Diagnosis not present

## 2023-02-13 DIAGNOSIS — M6281 Muscle weakness (generalized): Secondary | ICD-10-CM | POA: Diagnosis not present

## 2023-02-13 DIAGNOSIS — R600 Localized edema: Secondary | ICD-10-CM

## 2023-02-13 NOTE — Therapy (Signed)
OUTPATIENT PHYSICAL THERAPYTREATMENT   Patient Name: James Bird MRN: [id number] DOB:August 12, 1961, 62 y.o., male Today's Date: 02/13/2023  END OF SESSION:  PT End of Session - 02/13/23 0815     Visit Number 2    Number of Visits 20    Date for PT Re-Evaluation 04/20/23    Authorization Type BCBS state $52 copay    Progress Note Due on Visit 10    PT Start Time 0814    PT Stop Time 0855    PT Time Calculation (min) 41 min    Activity Tolerance Patient limited by fatigue;Patient limited by pain    Behavior During Therapy [facility] for tasks assessed/performed             Past Medical History:  Diagnosis Date   Allergy    RHINITS   Chronic shoulder pain    left   Constipation 2019   Diverticulosis    ED (erectile dysfunction)    Former smoker    quit late 2018   H/O echocardiogram 03/20/08   mild - moderate LVH, EF 50-55%, mild RV enlargement, mild mitral regurgitation, Dr. 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 Reyes Ivan   History of neck surgery    Hyperlipidemia    Hypertension    Impaired fasting glucose    Past Surgical History:  Procedure Laterality Date   COLONOSCOPY  10/2010   diverticulosis, othewrise normal, repeat 10 years, Dr. Marina Goodell   SPINE SURGERY  2011(JENKINS,MD)   CERVICAL DISC AND FUSION   TOTAL KNEE ARTHROPLASTY Left 01/23/2023   Procedure: LEFT TOTAL KNEE ARTHROPLASTY;  Surgeon: Tarry Kos, MD;  Location: [facility] OR;  Service: Orthopedics;  Laterality: Left;   Patient Active Problem List   Diagnosis Date Noted   Status post total left knee replacement 01/23/2023   History of smoking 10-25 pack years 08/24/2021   History of colonic polyps 08/24/2021   H/O cervical discectomy 08/24/2021   Constipation 04/16/2020   Family history of heart disease 02/21/2019   Paresthesia 05/24/2018   Elevated PSA 05/24/2018   Nail deformity 05/24/2018    Onychomycosis 05/24/2018   Tinea pedis 05/24/2018   Lumbar radiculopathy 04/25/2018   Primary osteoarthritis of left knee 03/29/2017   Clonus 03/29/2017   Right foot drop 03/29/2017   Routine general medical examination at a health care facility 12/14/2015   Obesity 12/11/2014   Hyperlipidemia 11/28/2012   Erectile dysfunction 11/28/2012   Essential hypertension, benign 11/02/2011   Impaired fasting blood sugar 11/02/2011    PCP: Ronnald Nian MD  REFERRING PROVIDER: Cristie Hem, PA-C  REFERRING DIAG: [phone] (ICD-10-CM) - Status post total left knee replacement  THERAPY DIAG:  Chronic pain of left knee  Muscle weakness (generalized)  Stiffness of left knee, not elsewhere classified  Difficulty in walking, not elsewhere classified  Localized edema  Rationale for Evaluation and Treatment: Rehabilitation  ONSET DATE: Surgery 01/23/2023  SUBJECTIVE:   SUBJECTIVE STATEMENT: He has been doing his exercises and feels like he is seeing improvement in both knee motions.   Had home health PT.    Pt indicated still having endurance issues with activity.   PERTINENT HISTORY: PMH includes diverticulosis, ED, herpes simplex, HLD, HTN.  PAIN:  NPRS scale:  at current 2/10, since last PT at worst 8-9/10,at best 2/10 Pain location: Lt knee  Pain description: stiffness, achy  Aggravating factors: static positioning, WB activity, end ranges Relieving factors: moving around, ice, stretching, medicine  PRECAUTIONS: None  WEIGHT BEARING RESTRICTIONS: No  FALLS:  Has patient fallen in last 6 months? No  LIVING ENVIRONMENT: Lives with: lives alone Lives in: House/apartment Stairs: has stairs for laundry.  3 stairs with rails Has following equipment at home: FWW,   OCCUPATION: Database admin.  Computer work.    PLOF: Independent, racquet ball  PATIENT GOALS: Reduce pain  OBJECTIVE:   PATIENT SURVEYS:  02/09/2023 FOTO intake:  36  predicted:   59  COGNITION: 02/09/2023 Overall cognitive status: WFL    SENSATION: 02/09/2023 No specific testing today  EDEMA:  02/09/2023 Localized edema noted Lt knee/Lt lower leg  MUSCLE LENGTH: 02/09/2023 No specific testing today  POSTURE:  02/09/2023 Difficulty in sitting, lying down tolerance to static postures.    PALPATION: 02/09/2023 Incision  LOWER EXTREMITY ROM:   ROM Left 02/09/2023  Hip flexion   Hip extension   Hip abduction   Hip adduction   Hip internal rotation   Hip external rotation   Knee flexion 86 AROM in supine heel slide  Knee extension -28 AROM in seated LAQ  -7 In supine heel prop  Ankle dorsiflexion   Ankle plantarflexion   Ankle inversion   Ankle eversion    (Blank rows = not tested)  LOWER EXTREMITY MMT:  MMT Right 02/09/23 Left 02/09/23  Hip flexion 5/5 4/5  Hip extension    Hip abduction    Hip adduction    Hip internal rotation    Hip external rotation    Knee flexion 5/5 4/5  Knee extension 5/5 2+/5  Ankle dorsiflexion 5/5 5/5  Ankle plantarflexion    Ankle inversion    Ankle eversion     (Blank rows = not tested)  LOWER EXTREMITY SPECIAL TESTS:  02/09/2023 No specific testing   FUNCTIONAL TESTS:  02/09/2023 18 inch chair transfer: unable s UE assist  GAIT: 02/09/2023 Modified independent ambulation c FWW with reduced stance on Lt leg, reduce toe off progression, maintained knee flexion in stance, lacking full TKE                                                                                                                                                                        TODAY'S  TREATMENT  DATE: 02/13/2023 Pt arrived late Therapeutic Exercise: Sci[employer] bike LEs only seat 13 level 1 for 5 min PT reviewed HEP with PT demo, verbal & HO cues. LLE Seated quad set 5 sec hold 10 reps LLE Seated heel slide with ext / quad set, knee flex with RLE increase  ROM then hamstring set 5 sec hold 10 reps LLE Seated LAQ with strap assist end range ext 5 sec hold 10 reps LLE Seated with LLE ext in 2nd chair with 5# proximal to knee 1 min, then knee flex with Left foot at edge of chair for 1 min LLE Supine heel slide with 18" ball under LE & strap 5 sec hold both flex & ext 10 reps  Self-Care:  PT demo & verbal cues on using pillows for positioning in bed to "tent" / lift off feet and one pillow bw LEs. Pt verbalized understanding.   Vaso left knee medium compression 34* 10 minutes with elevation.  TREATMENT                                                                          DATE: 02/09/2023 Therex:    HEP instruction/performance c cues for techniques, handout provided.  Trial set performed of each for comprehension and symptom assessment.  See below for exercise list  Vasopnuematic Device Elevation Lt leg 10 mins medium compression 34 deg  PATIENT EDUCATION:  02/09/2023 Education details: HEP, POC Person educated: Patient Education method: Programmer, multimedia, Demonstration, Verbal cues, and Handouts Education comprehension: verbalized understanding, returned demonstration, and verbal cues required  HOME EXERCISE PROGRAM: Access Code: [email or web address] URL: [email or web address] Date: 02/13/2023 Prepared by: Vladimir Faster  Exercises - Supine Heel Slide (Mirrored)  - 3-5 x daily - 7 x weekly - 2-3 sets - 10 reps - 5 hold - Supine Knee Extension Mobilization with Weight (Mirrored)  - 4-5 x daily - 7 x weekly - 2-3 sets - 1 reps - to tolerance up to 15 mins hold - Supine Quadricep Sets  - 3-5 x daily - 7 x weekly - 2-3 sets - 10 reps - 5 hold - Seated Quad Set (Mirrored)  - 3-5 x daily - 7 x weekly - 2-3 sets - 10 reps - 5 hold - Seated Knee Flexion Extension AROM   - 3-5 x daily - 7 x weekly - 2-3 sets - 10 reps - 5 seconds hold - Seated Long Arc Quad (Mirrored)  - 3-5 x daily - 7 x weekly - 2-3 sets - 5-10 reps - 2 hold - Seated Passive Knee  Extension  - 3-5 x daily - 7 x weekly - 2-3 sets - 1 reps - 1-2 minutes hold  ASSESSMENT:  CLINICAL IMPRESSION: PT reviewed & updated HEP which patient appears to understand.  He appeared to tolerate greater range with repetition but fatigues quickly.  Pt continues to benefit from skilled PT.   OBJECTIVE IMPAIRMENTS: Abnormal gait, decreased balance, decreased coordination, decreased endurance, decreased mobility, difficulty walking, decreased ROM, decreased strength, hypomobility, increased edema, increased fascial restrictions, impaired perceived functional ability, impaired flexibility, improper body mechanics, and pain.   ACTIVITY LIMITATIONS: carrying, lifting, bending, sitting, standing, squatting, sleeping, stairs, transfers, bed mobility, dressing, and locomotion level  PARTICIPATION LIMITATIONS: cleaning, laundry, interpersonal  relationship, driving, shopping, and community activity  PERSONAL FACTORS:  PMH includes diverticulosis, ED, herpes simplex, HLD, HTN.  are also affecting patient's functional outcome.   REHAB POTENTIAL: Good  CLINICAL DECISION MAKING: Stable/uncomplicated  EVALUATION COMPLEXITY: Low   GOALS: Goals reviewed with patient? Yes  SHORT TERM GOALS: (target date for Short term goals are 3 weeks 03/02/2023)   1.  Patient will demonstrate independent use of home exercise program to maintain progress from in clinic treatments.  Goal status: New  LONG TERM GOALS: (target dates for all long term goals are 10 weeks  04/20/2023 )   1. Patient will demonstrate/report pain at worst less than or equal to 2/10 to facilitate minimal limitation in daily activity secondary to pain symptoms.  Goal status: New   2. Patient will demonstrate independent use of home exercise program to facilitate ability to maintain/progress functional gains from skilled physical therapy services.  Goal status: New   3. Patient will demonstrate FOTO outcome > or = 59 % to indicate  reduced disability due to condition.  Goal status: New   4.  Patient will demonstrate Lt LE MMT 5/5 throughout to faciltiate usual transfers, stairs, squatting at [facility] for daily life.   Goal status: New   5.  Patient will demonstrate independent ambulation community distances > 300 ft for usual mobility.  Goal status: New   6.  Patient will demonstrate ascending/descending stairs reciprocally s UE assist for community integration.   Goal status: New   7.  Patient will demonstrate Lt knee AROM 0-110 deg to facilitate transfers, ambulation and other daily activity at PLOF.  Goal Status: New   PLAN:  PT FREQUENCY: 1-3x/week  PT DURATION: 10 weeks  PLANNED INTERVENTIONS: Therapeutic exercises, Therapeutic activity, Neuro Muscular re-education, Balance training, Gait training, Patient/Family education, Joint mobilization, Stair training, DME instructions, Dry Needling, Electrical stimulation, Traction, Cryotherapy, vasopneumatic deviceMoist heat, Taping, Ultrasound, Ionotophoresis 4mg /ml Dexamethasone, and aquatic therapy, Manual therapy.  All included unless contraindicated  PLAN FOR NEXT SESSION:   Progressive strengthening, mobility with manual and therex. Vaso for edema.    Vladimir Faster, PT, DPT 02/13/2023, 12:56 PM

## 2023-02-14 ENCOUNTER — Ambulatory Visit (INDEPENDENT_AMBULATORY_CARE_PROVIDER_SITE_OTHER): Payer: BC Managed Care – PPO | Admitting: Acute Care

## 2023-02-14 ENCOUNTER — Encounter: Payer: Self-pay | Admitting: Acute Care

## 2023-02-14 DIAGNOSIS — F17210 Nicotine dependence, cigarettes, uncomplicated: Secondary | ICD-10-CM

## 2023-02-14 NOTE — Therapy (Signed)
OUTPATIENT PHYSICAL THERAPYTREATMENT   Patient Name: James Bird DOB:07-01-1961, 62 y.o., male Today's Date: 02/14/2023  END OF SESSION:    Past Medical History:  Diagnosis Date   Allergy    RHINITS   Chronic shoulder pain    left   Constipation 2019   Diverticulosis    ED (erectile dysfunction)    Former smoker    quit late 2018   H/O echocardiogram 03/20/08   mild - moderate LVH, EF 50-55%, mild RV enlargement, mild mitral regurgitation, Dr. 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 Reyes Ivan   History of neck surgery    Hyperlipidemia    Hypertension    Impaired fasting glucose    Past Surgical History:  Procedure Laterality Date   COLONOSCOPY  10/2010   diverticulosis, othewrise normal, repeat 10 years, Dr. Marina Goodell   SPINE SURGERY  2011(JENKINS,MD)   CERVICAL DISC AND FUSION   TOTAL KNEE ARTHROPLASTY Left 01/23/2023   Procedure: LEFT TOTAL KNEE ARTHROPLASTY;  Surgeon: Tarry Kos, MD;  Location: [facility] OR;  Service: Orthopedics;  Laterality: Left;   Patient Active Problem List   Diagnosis Date Noted   Status post total left knee replacement 01/23/2023   History of smoking 10-25 pack years 08/24/2021   History of colonic polyps 08/24/2021   H/O cervical discectomy 08/24/2021   Constipation 04/16/2020   Family history of heart disease 02/21/2019   Paresthesia 05/24/2018   Elevated PSA 05/24/2018   Nail deformity 05/24/2018   Onychomycosis 05/24/2018   Tinea pedis 05/24/2018   Lumbar radiculopathy 04/25/2018   Primary osteoarthritis of left knee 03/29/2017   Clonus 03/29/2017   Right foot drop 03/29/2017   Routine general medical examination at a health care facility 12/14/2015   Obesity 12/11/2014   Hyperlipidemia 11/28/2012   Erectile dysfunction 11/28/2012   Essential hypertension, benign 11/02/2011   Impaired fasting blood sugar  11/02/2011    PCP: Ronnald Nian MD  REFERRING PROVIDER: Cristie Hem, PA-C  REFERRING DIAG: [phone] (ICD-10-CM) - Status post total left knee replacement  THERAPY DIAG:  No diagnosis found.  Rationale for Evaluation and Treatment: Rehabilitation  ONSET DATE: Surgery 01/23/2023  SUBJECTIVE:   SUBJECTIVE STATEMENT: He has been doing his exercises and feels like he is seeing improvement in both knee motions.   Had home health PT.    Pt indicated still having endurance issues with activity.   PERTINENT HISTORY: PMH includes diverticulosis, ED, herpes simplex, HLD, HTN.  PAIN:  NPRS scale:  at current 2/10, since last PT at worst 8-9/10,at best 2/10 Pain location: Lt knee  Pain description: stiffness, achy Aggravating factors: static positioning, WB activity, end ranges Relieving factors: moving around, ice, stretching, medicine  PRECAUTIONS: None  WEIGHT BEARING RESTRICTIONS: No  FALLS:  Has patient fallen in last 6 months? No  LIVING ENVIRONMENT: Lives with: lives alone Lives in: House/apartment Stairs: has stairs for laundry.  3 stairs with rails Has following equipment at home: FWW,   OCCUPATION: Database admin.  Computer work.    PLOF: Independent, racquet ball  PATIENT GOALS: Reduce pain  OBJECTIVE:   PATIENT SURVEYS:  02/09/2023 FOTO intake:  36  predicted:  59  COGNITION: 02/09/2023 Overall cognitive status: WFL    SENSATION: 02/09/2023 No specific testing today  EDEMA:  02/09/2023 Localized edema noted Lt knee/Lt lower leg  MUSCLE LENGTH:  02/09/2023 No specific testing today  POSTURE:  02/09/2023 Difficulty in sitting, lying down tolerance to static postures.    PALPATION: 02/09/2023 Incision  LOWER EXTREMITY ROM:   ROM Left 02/09/2023  Hip flexion   Hip extension   Hip abduction   Hip adduction   Hip internal rotation   Hip external rotation   Knee flexion 86 AROM in supine heel slide  Knee extension -28 AROM in seated  LAQ  -7 In supine heel prop  Ankle dorsiflexion   Ankle plantarflexion   Ankle inversion   Ankle eversion    (Blank rows = not tested)  LOWER EXTREMITY MMT:  MMT Right 02/09/23 Left 02/09/23  Hip flexion 5/5 4/5  Hip extension    Hip abduction    Hip adduction    Hip internal rotation    Hip external rotation    Knee flexion 5/5 4/5  Knee extension 5/5 2+/5  Ankle dorsiflexion 5/5 5/5  Ankle plantarflexion    Ankle inversion    Ankle eversion     (Blank rows = not tested)  LOWER EXTREMITY SPECIAL TESTS:  02/09/2023 No specific testing   FUNCTIONAL TESTS:  02/09/2023 18 inch chair transfer: unable s UE assist  GAIT: 02/09/2023 Modified independent ambulation c FWW with reduced stance on Lt leg, reduce toe off progression, maintained knee flexion in stance, lacking full TKE                                                                                                                                                                        TODAY'S  TREATMENT                                                                          DATE: 02/15/2023 Therapeutic Exercise: SciFit bike LEs only seat 13 level 1 for 5 min PT reviewed HEP with PT demo, verbal & HO cues. LLE Seated quad set 5 sec hold 10 reps LLE Seated heel slide with ext / quad set, knee flex with RLE increase ROM then hamstring set 5 sec hold 10 reps LLE Seated LAQ with strap assist end range ext 5 sec hold 10 reps LLE Seated with LLE ext in 2nd chair with 5# proximal to knee 1 min, then knee flex with Left foot at edge of chair for 1 min LLE Supine heel slide with 18" ball under LE & strap 5 sec hold both flex & ext 10 reps   Vaso  left knee medium compression 34* 10 minutes with elevation.  TODAY'S  TREATMENT                                                                          DATE: 02/13/2023 Pt arrived late Therapeutic Exercise: SciFit bike LEs only seat 13 level 1 for 5 min PT reviewed HEP with PT demo,  verbal & HO cues. LLE Seated quad set 5 sec hold 10 reps LLE Seated heel slide with ext / quad set, knee flex with RLE increase ROM then hamstring set 5 sec hold 10 reps LLE Seated LAQ with strap assist end range ext 5 sec hold 10 reps LLE Seated with LLE ext in 2nd chair with 5# proximal to knee 1 min, then knee flex with Left foot at edge of chair for 1 min LLE Supine heel slide with 18" ball under LE & strap 5 sec hold both flex & ext 10 reps  Self-Care:  PT demo & verbal cues on using pillows for positioning in bed to "tent" / lift off feet and one pillow bw LEs. Pt verbalized understanding.   Vaso left knee medium compression 34* 10 minutes with elevation.  TREATMENT                                                                          DATE: 02/09/2023 Therex:    HEP instruction/performance c cues for techniques, handout provided.  Trial set performed of each for comprehension and symptom assessment.  See below for exercise list  Vasopnuematic Device Elevation Lt leg 10 mins medium compression 34 deg  PATIENT EDUCATION:  02/09/2023 Education details: HEP, POC Person educated: Patient Education method: Programmer, multimedia, Demonstration, Verbal cues, and Handouts Education comprehension: verbalized understanding, returned demonstration, and verbal cues required  HOME EXERCISE PROGRAM: Access Code: [email or web address] URL: [email or web address] Date: 02/13/2023 Prepared by: Vladimir Faster  Exercises - Supine Heel Slide (Mirrored)  - 3-5 x daily - 7 x weekly - 2-3 sets - 10 reps - 5 hold - Supine Knee Extension Mobilization with Weight (Mirrored)  - 4-5 x daily - 7 x weekly - 2-3 sets - 1 reps - to tolerance up to 15 mins hold - Supine Quadricep Sets  - 3-5 x daily - 7 x weekly - 2-3 sets - 10 reps - 5 hold - Seated Quad Set (Mirrored)  - 3-5 x daily - 7 x weekly - 2-3 sets - 10 reps - 5 hold - Seated Knee Flexion Extension AROM   - 3-5 x daily - 7 x weekly - 2-3 sets - 10 reps - 5  seconds hold - Seated Long Arc Quad (Mirrored)  - 3-5 x daily - 7 x weekly - 2-3 sets - 5-10 reps - 2 hold - Seated Passive Knee Extension  - 3-5 x daily - 7 x weekly - 2-3 sets - 1 reps - 1-2 minutes hold  ASSESSMENT:  CLINICAL IMPRESSION: PT reviewed & updated HEP which  patient appears to understand.  He appeared to tolerate greater range with repetition but fatigues quickly.  Pt continues to benefit from skilled PT.   OBJECTIVE IMPAIRMENTS: Abnormal gait, decreased balance, decreased coordination, decreased endurance, decreased mobility, difficulty walking, decreased ROM, decreased strength, hypomobility, increased edema, increased fascial restrictions, impaired perceived functional ability, impaired flexibility, improper body mechanics, and pain.   ACTIVITY LIMITATIONS: carrying, lifting, bending, sitting, standing, squatting, sleeping, stairs, transfers, bed mobility, dressing, and locomotion level  PARTICIPATION LIMITATIONS: cleaning, laundry, interpersonal relationship, driving, shopping, and community activity  PERSONAL FACTORS:  PMH includes diverticulosis, ED, herpes simplex, HLD, HTN.  are also affecting patient's functional outcome.   REHAB POTENTIAL: Good  CLINICAL DECISION MAKING: Stable/uncomplicated  EVALUATION COMPLEXITY: Low   GOALS: Goals reviewed with patient? Yes  SHORT TERM GOALS: (target date for Short term goals are 3 weeks 03/02/2023)   1.  Patient will demonstrate independent use of home exercise program to maintain progress from in clinic treatments.  Goal status: New  LONG TERM GOALS: (target dates for all long term goals are 10 weeks  04/20/2023 )   1. Patient will demonstrate/report pain at worst less than or equal to 2/10 to facilitate minimal limitation in daily activity secondary to pain symptoms.  Goal status: New   2. Patient will demonstrate independent use of home exercise program to facilitate ability to maintain/progress functional gains from  skilled physical therapy services.  Goal status: New   3. Patient will demonstrate FOTO outcome > or = 59 % to indicate reduced disability due to condition.  Goal status: New   4.  Patient will demonstrate Lt LE MMT 5/5 throughout to faciltiate usual transfers, stairs, squatting at [facility] for daily life.   Goal status: New   5.  Patient will demonstrate independent ambulation community distances > 300 ft for usual mobility.  Goal status: New   6.  Patient will demonstrate ascending/descending stairs reciprocally s UE assist for community integration.   Goal status: New   7.  Patient will demonstrate Lt knee AROM 0-110 deg to facilitate transfers, ambulation and other daily activity at PLOF.  Goal Status: New   PLAN:  PT FREQUENCY: 1-3x/week  PT DURATION: 10 weeks  PLANNED INTERVENTIONS: Therapeutic exercises, Therapeutic activity, Neuro Muscular re-education, Balance training, Gait training, Patient/Family education, Joint mobilization, Stair training, DME instructions, Dry Needling, Electrical stimulation, Traction, Cryotherapy, vasopneumatic deviceMoist heat, Taping, Ultrasound, Ionotophoresis 4mg /ml Dexamethasone, and aquatic therapy, Manual therapy.  All included unless contraindicated  PLAN FOR NEXT SESSION:   Progressive strengthening, mobility with manual and therex. Vaso for edema.    Barbaraann Rondo, PT, DPT 02/14/2023, 3:42 PM

## 2023-02-14 NOTE — Patient Instructions (Signed)
Thank you for participating in the [facility]. It was our pleasure to meet you today. We will call you with the results of your scan within the next few days. Your scan will be assigned a Lung RADS category score by the physicians reading the scans.  This Lung RADS score determines follow up scanning.  See below for description of categories, and follow up screening recommendations. We will be in touch to schedule your follow up screening annually or based on recommendations of our providers. We will fax a copy of your scan results to your Primary Care Physician, or the physician who referred you to the program, to ensure they have the results. Please call the office if you have any questions or concerns regarding your scanning experience or results.  Our office number is [phone]. Please speak with Denise Phelps, RN. , or  Denise Buckner RN, They are  our Lung Cancer Screening RN.'s If They are unavailable when you call, Please leave a message on the voice mail. We will return your call at our earliest convenience.This voice mail is monitored several times a day.  Remember, if your scan is normal, we will scan you annually as long as you continue to meet the criteria for the program. (Age 50-80, Current smoker or smoker who has quit within the last 15 years). If you are a smoker, remember, quitting is the single most powerful action that you can take to decrease your risk of lung cancer and other pulmonary, breathing related problems. We know quitting is hard, and we are here to help.  Please let us know if there is anything we can do to help you meet your goal of quitting. If you are a former smoker, congratulations. We are proud of you! Remain smoke free! Remember you can refer friends or family members through the number above.  We will screen them to make sure they meet criteria for the program. Thank you for helping us take better care of you by  participating in Lung Screening.  You can receive free nicotine replacement therapy ( patches, gum or mints) by calling 1-800-QUIT NOW. Please call so we can get you on the path to becoming  a non-smoker. I know it is hard, but you can do this!  Lung RADS Categories:  Lung RADS 1: no nodules or definitely non-concerning nodules.  Recommendation is for a repeat annual scan in 12 months.  Lung RADS 2:  nodules that are non-concerning in appearance and behavior with a very low likelihood of becoming an active cancer. Recommendation is for a repeat annual scan in 12 months.  Lung RADS 3: nodules that are probably non-concerning , includes nodules with a low likelihood of becoming an active cancer.  Recommendation is for a 6-month repeat screening scan. Often noted after an upper respiratory illness. We will be in touch to make sure you have no questions, and to schedule your 6-month scan.  Lung RADS 4 A: nodules with concerning findings, recommendation is most often for a follow up scan in 3 months or additional testing based on our provider's assessment of the scan. We will be in touch to make sure you have no questions and to schedule the recommended 3 month follow up scan.  Lung RADS 4 B:  indicates findings that are concerning. We will be in touch with you to schedule additional diagnostic testing based on our provider's  assessment of the scan.  Other options for assistance in smoking cessation (   As covered by your insurance benefits)  Hypnosis for smoking cessation  [employer] [phone]  Acupuncture for smoking cessation  [facility] [phone]   

## 2023-02-14 NOTE — Progress Notes (Signed)
Virtual Visit via Telephone Note  I connected with James Bird on 02/14/23 at  2:00 PM EDT by telephone and verified that I am speaking with the correct person using two identifiers.  Location: Patient: At home Provider: [address], [place], [place], [address]    Shared Decision Making Visit Lung Cancer Screening Program [phone])   Eligibility: Age 62 y.o. Pack Years Smoking History Calculation 26 pack year smoking history (# packs/per year x # years smoked) Recent History of coughing up blood  no Unexplained weight loss? no ( >Than 15 pounds within the last 6 months ) Prior History Lung / other cancer no (Diagnosis within the last 5 years already requiring surveillance chest CT Scans). Smoking Status Current Smoker Former Smokers: Years since quit:  NA  Quit Date:  NA  Visit Components: Discussion included one or more decision making aids. yes Discussion included risk/benefits of screening. yes Discussion included potential follow up diagnostic testing for abnormal scans. yes Discussion included meaning and risk of over diagnosis. yes Discussion included meaning and risk of False Positives. yes Discussion included meaning of total radiation exposure. yes  Counseling Included: Importance of adherence to annual lung cancer LDCT screening. yes Impact of comorbidities on ability to participate in the program. yes Ability and willingness to under diagnostic treatment. yes  Smoking Cessation Counseling: Current Smokers:  Discussed importance of smoking cessation. yes Information about tobacco cessation classes and interventions provided to patient. yes Patient provided with "ticket" for LDCT Scan. yes Symptomatic Patient. no  Counseling NA Diagnosis Code: Tobacco Use Z72.0 Asymptomatic Patient no  Counseling (Intermediate counseling: > three minutes counseling) E9528 Former Smokers:  Discussed the importance of maintaining cigarette abstinence. yes Diagnosis  Code: Personal History of Nicotine Dependence. U13.244 Information about tobacco cessation classes and interventions provided to patient. Yes Patient provided with "ticket" for LDCT Scan. yes Written Order for Lung Cancer Screening with LDCT placed in Epic. Yes (CT Chest Lung Cancer Screening Low Dose W/O CM) [id number] Z12.2-Screening of respiratory organs Z87.891-Personal history of nicotine dependence  I have spent 25 minutes of face to face/ virtual visit   time with  James Bird discussing the risks and benefits of lung cancer screening. We viewed / discussed a power point together that explained in detail the above noted topics. We paused at intervals to allow for questions to be asked and answered to ensure understanding.We discussed that the single most powerful action that he can take to decrease his risk of developing lung cancer is to quit smoking. We discussed whether or not he is ready to commit to setting a quit date. We discussed options for tools to aid in quitting smoking including nicotine replacement therapy, non-nicotine medications, support groups, Quit Smart classes, and behavior modification. We discussed that often times setting smaller, more achievable goals, such as eliminating 1 cigarette a day for a week and then 2 cigarettes a day for a week can be helpful in slowly decreasing the number of cigarettes smoked. This allows for a sense of accomplishment as well as providing a clinical benefit. I provided  him  with smoking cessation  information  with contact information for community resources, classes, free nicotine replacement therapy, and access to mobile apps, text messaging, and on-line smoking cessation help. I have also provided  him  the office contact information in the event he needs to contact me, or the screening staff. We discussed the time and location of the scan, and that either James Miyamoto RN,  James Lemon, RN  or I will call / send a letter with the results within  24-72 hours of receiving them. The patient verbalized understanding of all of  the above and had no further questions upon leaving the office. They have my contact information in the event they have any further questions.  I spent 3 minutes counseling on smoking cessation and the health risks of continued tobacco abuse.  I explained to the patient that there has been a high incidence of coronary artery disease noted on these exams. I explained that this is a non-gated exam therefore degree or severity cannot be determined. This patient is not on statin therapy. I have asked the patient to follow-up with their PCP regarding any incidental finding of coronary artery disease and management with diet or medication as their PCP  feels is clinically indicated. The patient verbalized understanding of the above and had no further questions upon completion of the visit.      Bevelyn Ngo, NP 02/14/2023

## 2023-02-15 ENCOUNTER — Ambulatory Visit
Admission: RE | Admit: 2023-02-15 | Discharge: 2023-02-15 | Disposition: A | Discharge status: Home / Self Care | Payer: BC Managed Care – PPO | Source: Ambulatory Visit | Attending: Family Medicine | Admitting: Family Medicine

## 2023-02-15 ENCOUNTER — Encounter: Payer: Self-pay | Admitting: Rehabilitative and Restorative Service Providers"

## 2023-02-15 ENCOUNTER — Ambulatory Visit: Payer: BC Managed Care – PPO | Admitting: Rehabilitative and Restorative Service Providers"

## 2023-02-15 DIAGNOSIS — R600 Localized edema: Secondary | ICD-10-CM

## 2023-02-15 DIAGNOSIS — Z87891 Personal history of nicotine dependence: Secondary | ICD-10-CM

## 2023-02-15 DIAGNOSIS — M25662 Stiffness of left knee, not elsewhere classified: Secondary | ICD-10-CM

## 2023-02-15 DIAGNOSIS — G8929 Other chronic pain: Secondary | ICD-10-CM

## 2023-02-15 DIAGNOSIS — M6281 Muscle weakness (generalized): Secondary | ICD-10-CM | POA: Diagnosis not present

## 2023-02-15 DIAGNOSIS — F17210 Nicotine dependence, cigarettes, uncomplicated: Secondary | ICD-10-CM

## 2023-02-15 DIAGNOSIS — R262 Difficulty in walking, not elsewhere classified: Secondary | ICD-10-CM

## 2023-02-15 DIAGNOSIS — M25562 Pain in left knee: Secondary | ICD-10-CM | POA: Diagnosis not present

## 2023-02-20 ENCOUNTER — Other Ambulatory Visit: Payer: Self-pay

## 2023-02-20 DIAGNOSIS — Z87891 Personal history of nicotine dependence: Secondary | ICD-10-CM

## 2023-02-20 DIAGNOSIS — F17210 Nicotine dependence, cigarettes, uncomplicated: Secondary | ICD-10-CM

## 2023-02-23 ENCOUNTER — Encounter: Payer: Self-pay | Admitting: Rehabilitative and Restorative Service Providers"

## 2023-02-23 ENCOUNTER — Ambulatory Visit: Payer: BC Managed Care – PPO | Admitting: Rehabilitative and Restorative Service Providers"

## 2023-02-23 DIAGNOSIS — M25562 Pain in left knee: Secondary | ICD-10-CM

## 2023-02-23 DIAGNOSIS — R262 Difficulty in walking, not elsewhere classified: Secondary | ICD-10-CM | POA: Diagnosis not present

## 2023-02-23 DIAGNOSIS — G8929 Other chronic pain: Secondary | ICD-10-CM

## 2023-02-23 DIAGNOSIS — M6281 Muscle weakness (generalized): Secondary | ICD-10-CM | POA: Diagnosis not present

## 2023-02-23 DIAGNOSIS — M25662 Stiffness of left knee, not elsewhere classified: Secondary | ICD-10-CM

## 2023-02-23 DIAGNOSIS — R600 Localized edema: Secondary | ICD-10-CM

## 2023-02-23 NOTE — Therapy (Signed)
OUTPATIENT PHYSICAL THERAPY TREATMENT   Patient Name: James Bird MRN: [id number] DOB:Jul 15, 1961, 62 y.o., male Today's Date: 02/23/2023  END OF SESSION:  PT End of Session - 02/23/23 1147     Visit Number 4    Number of Visits 20    Date for PT Re-Evaluation 04/20/23    Authorization Type BCBS state $52 copay    Progress Note Due on Visit 10    PT Start Time 1138    PT Stop Time 1227    PT Time Calculation (min) 49 min    Activity Tolerance Patient tolerated treatment well    Behavior During Therapy [facility] for tasks assessed/performed               Past Medical History:  Diagnosis Date   Allergy    RHINITS   Chronic shoulder pain    left   Constipation 2019   Diverticulosis    ED (erectile dysfunction)    Former smoker    quit late 2018   H/O echocardiogram 03/20/08   mild - moderate LVH, EF 50-55%, mild RV enlargement, mild mitral regurgitation, Dr. 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 Reyes Ivan   History of neck surgery    Hyperlipidemia    Hypertension    Impaired fasting glucose    Past Surgical History:  Procedure Laterality Date   COLONOSCOPY  10/2010   diverticulosis, othewrise normal, repeat 10 years, Dr. Marina Goodell   SPINE SURGERY  2011(JENKINS,MD)   CERVICAL DISC AND FUSION   TOTAL KNEE ARTHROPLASTY Left 01/23/2023   Procedure: LEFT TOTAL KNEE ARTHROPLASTY;  Surgeon: Tarry Kos, MD;  Location: [facility] OR;  Service: Orthopedics;  Laterality: Left;   Patient Active Problem List   Diagnosis Date Noted   Status post total left knee replacement 01/23/2023   History of smoking 10-25 pack years 08/24/2021   History of colonic polyps 08/24/2021   H/O cervical discectomy 08/24/2021   Constipation 04/16/2020   Family history of heart disease 02/21/2019   Paresthesia 05/24/2018   Elevated PSA 05/24/2018   Nail deformity 05/24/2018   Onychomycosis  05/24/2018   Tinea pedis 05/24/2018   Lumbar radiculopathy 04/25/2018   Primary osteoarthritis of left knee 03/29/2017   Clonus 03/29/2017   Right foot drop 03/29/2017   Routine general medical examination at a health care facility 12/14/2015   Obesity 12/11/2014   Hyperlipidemia 11/28/2012   Erectile dysfunction 11/28/2012   Essential hypertension, benign 11/02/2011   Impaired fasting blood sugar 11/02/2011    PCP: Ronnald Nian MD  REFERRING PROVIDER: Cristie Hem, PA-C  REFERRING DIAG: [phone] (ICD-10-CM) - Status post total left knee replacement  THERAPY DIAG:  Chronic pain of left knee  Muscle weakness (generalized)  Stiffness of left knee, not elsewhere classified  Difficulty in walking, not elsewhere classified  Localized edema  Rationale for Evaluation and Treatment: Rehabilitation  ONSET DATE: Surgery 01/23/2023  SUBJECTIVE:   SUBJECTIVE STATEMENT: Reported a steady 3/10 upon arrival.  Asked about use of gym for exercise as well.    PERTINENT HISTORY: PMH includes diverticulosis, ED, herpes simplex, HLD, HTN.  PAIN:  NPRS scale: steady 3/10 Pain location: Lt knee  Pain description: stiffness, achy Aggravating factors: static positioning, WB activity, end ranges Relieving factors: moving around, ice, stretching, medicine  PRECAUTIONS: None  WEIGHT BEARING RESTRICTIONS: No  FALLS:  Has  patient fallen in last 6 months? No  LIVING ENVIRONMENT: Lives with: lives alone Lives in: House/apartment Stairs: has stairs for laundry.  3 stairs with rails Has following equipment at home: FWW,   OCCUPATION: Database admin.  Computer work.    PLOF: Independent, racquet ball  PATIENT GOALS: Reduce pain  OBJECTIVE:   PATIENT SURVEYS:  02/09/2023 FOTO intake:  36  predicted:  59  COGNITION: 02/09/2023 Overall cognitive status: WFL    SENSATION: 02/09/2023 No specific testing today  EDEMA:  02/09/2023 Localized edema noted Lt knee/Lt lower  leg  MUSCLE LENGTH: 02/09/2023 No specific testing today  POSTURE:  02/09/2023 Difficulty in sitting, lying down tolerance to static postures.    PALPATION: 02/09/2023 Incision  LOWER EXTREMITY ROM:   ROM Left 02/09/2023 Left 02/15/2023 Left 02/23/2023  Hip flexion     Hip extension     Hip abduction     Hip adduction     Hip internal rotation     Hip external rotation     Knee flexion 86 AROM in supine heel slide 106 AROM in supine heel slide   Knee extension -28 AROM in seated LAQ  -7 In supine heel prop -11 AROM in seated LAQ Extension lag in SLR seated -12  Ankle dorsiflexion     Ankle plantarflexion     Ankle inversion     Ankle eversion      (Blank rows = not tested)  LOWER EXTREMITY MMT:  MMT Right 02/09/23 Left 02/09/23  Hip flexion 5/5 4/5  Hip extension    Hip abduction    Hip adduction    Hip internal rotation    Hip external rotation    Knee flexion 5/5 4/5  Knee extension 5/5 2+/5  Ankle dorsiflexion 5/5 5/5  Ankle plantarflexion    Ankle inversion    Ankle eversion     (Blank rows = not tested)  LOWER EXTREMITY SPECIAL TESTS:  02/09/2023 No specific testing   FUNCTIONAL TESTS:  02/09/2023 18 inch chair transfer: unable s UE assist  GAIT: 02/23/2023:  SPC use in clinic c maintained knee flexion in stance, reduced stance.   02/09/2023 Modified independent ambulation c FWW with reduced stance on Lt leg, reduce toe off progression, maintained knee flexion in stance, lacking full TKE                                                                                                                                                                        TODAY'S  TREATMENT  DATE: 02/23/2023 Therapeutic Exercise: SciFit bike LEs only seat 11 level 3 for 8 min Seated Lt quad set 5 sec hold x 5 Seated Lt quad set c SLR 3 x 5  Leg press double leg 75 lbs x15, Lt leg only 25 lbs x  15 Incline gastroc stretch 30 sec x 3 bilateral Supine heel slide/quad set combo Lt leg 5 sec hold each x 10 Supine LAQ in 90 deg hip flexion Lt leg x 15  Vasopneumatic   Left knee medium compression 34* 10 minutes with elevation.  Heel prop trial start of treatment.   TODAY'S  TREATMENT                                                                          DATE: 02/15/2023 Therapeutic Exercise: SciFit bike LEs only seat 13 level 1 for 8 min Incline gastroc stretch 30 sec x 3 bilateral 6 inch hurdle step over and back with hand on bar x 15 bilateral (for flexion gains, WB acceptance on leg) Seated LAQ Lt leg c pause in end range 2 sec each way 2 x 15 Lt leg supine quad set c heel slide combo 5 sec hold x 10  Supine heel prop to tolerance with start of vaso:  5 mins   Neuro Re-ed [employer] pew anterior/posterior weight shift 2 mins, time for cues for techniques  Vasopneumatic   Left knee medium compression 34* 10 minutes with elevation.  Heel prop trial start of treatment.   TODAY'S  TREATMENT                                                                          DATE: 02/13/2023 Pt arrived late Therapeutic Exercise: SciFit bike LEs only seat 13 level 1 for 5 min PT reviewed HEP with PT demo, verbal & HO cues. LLE Seated quad set 5 sec hold 10 reps LLE Seated heel slide with ext / quad set, knee flex with RLE increase ROM then hamstring set 5 sec hold 10 reps LLE Seated LAQ with strap assist end range ext 5 sec hold 10 reps LLE Seated with LLE ext in 2nd chair with 5# proximal to knee 1 min, then knee flex with Left foot at edge of chair for 1 min LLE Supine heel slide with 18" ball under LE & strap 5 sec hold both flex & ext 10 reps  Self-Care:  PT demo & verbal cues on using pillows for positioning in bed to "tent" / lift off feet and one pillow bw LEs. Pt verbalized understanding.   Vaso left knee medium compression 34* 10 minutes with elevation.   PATIENT EDUCATION:   02/09/2023 Education details: HEP, POC Person educated: Patient Education method: Programmer, multimedia, Demonstration, Verbal cues, and Handouts Education comprehension: verbalized understanding, returned demonstration, and verbal cues required  HOME EXERCISE PROGRAM: Access Code: [email or web address] URL: [email or web address] Date: 02/13/2023 Prepared by: Vladimir Faster  Exercises - Supine Heel Slide (Mirrored)  - 3-5  x daily - 7 x weekly - 2-3 sets - 10 reps - 5 hold - Supine Knee Extension Mobilization with Weight (Mirrored)  - 4-5 x daily - 7 x weekly - 2-3 sets - 1 reps - to tolerance up to 15 mins hold - Supine Quadricep Sets  - 3-5 x daily - 7 x weekly - 2-3 sets - 10 reps - 5 hold - Seated Quad Set (Mirrored)  - 3-5 x daily - 7 x weekly - 2-3 sets - 10 reps - 5 hold - Seated Knee Flexion Extension AROM   - 3-5 x daily - 7 x weekly - 2-3 sets - 10 reps - 5 seconds hold - Seated Long Arc Quad (Mirrored)  - 3-5 x daily - 7 x weekly - 2-3 sets - 5-10 reps - 2 hold - Seated Passive Knee Extension  - 3-5 x daily - 7 x weekly - 2-3 sets - 1 reps - 1-2 minutes hold  ASSESSMENT:  CLINICAL IMPRESSION: Extension lag noted in SLR attempts but slowly improving quad activation noted.  Progressive strength improvements to help improve ambulation and functional WB activity.  Recommend continued skilled PT services at this time.   OBJECTIVE IMPAIRMENTS: Abnormal gait, decreased balance, decreased coordination, decreased endurance, decreased mobility, difficulty walking, decreased ROM, decreased strength, hypomobility, increased edema, increased fascial restrictions, impaired perceived functional ability, impaired flexibility, improper body mechanics, and pain.   ACTIVITY LIMITATIONS: carrying, lifting, bending, sitting, standing, squatting, sleeping, stairs, transfers, bed mobility, dressing, and locomotion level  PARTICIPATION LIMITATIONS: cleaning, laundry, interpersonal relationship, driving,  shopping, and community activity  PERSONAL FACTORS:  PMH includes diverticulosis, ED, herpes simplex, HLD, HTN.  are also affecting patient's functional outcome.   REHAB POTENTIAL: Good  CLINICAL DECISION MAKING: Stable/uncomplicated  EVALUATION COMPLEXITY: Low   GOALS: Goals reviewed with patient? Yes  SHORT TERM GOALS: (target date for Short term goals are 3 weeks 03/02/2023)   1.  Patient will demonstrate independent use of home exercise program to maintain progress from in clinic treatments.  Goal status: on going 02/23/2023  LONG TERM GOALS: (target dates for all long term goals are 10 weeks  04/20/2023 )   1. Patient will demonstrate/report pain at worst less than or equal to 2/10 to facilitate minimal limitation in daily activity secondary to pain symptoms.  Goal status: New   2. Patient will demonstrate independent use of home exercise program to facilitate ability to maintain/progress functional gains from skilled physical therapy services.  Goal status: New   3. Patient will demonstrate FOTO outcome > or = 59 % to indicate reduced disability due to condition.  Goal status: New   4.  Patient will demonstrate Lt LE MMT 5/5 throughout to faciltiate usual transfers, stairs, squatting at [facility] for daily life.   Goal status: New   5.  Patient will demonstrate independent ambulation community distances > 300 ft for usual mobility.  Goal status: New   6.  Patient will demonstrate ascending/descending stairs reciprocally s UE assist for community integration.   Goal status: New   7.  Patient will demonstrate Lt knee AROM 0-110 deg to facilitate transfers, ambulation and other daily activity at PLOF.  Goal Status: New   PLAN:  PT FREQUENCY: 1-3x/week  PT DURATION: 10 weeks  PLANNED INTERVENTIONS: Therapeutic exercises, Therapeutic activity, Neuro Muscular re-education, Balance training, Gait training, Patient/Family education, Joint mobilization, Stair training, DME  instructions, Dry Needling, Electrical stimulation, Traction, Cryotherapy, vasopneumatic deviceMoist heat, Taping, Ultrasound, Ionotophoresis 4mg /ml Dexamethasone, and aquatic therapy,  Manual therapy.  All included unless contraindicated  PLAN FOR NEXT SESSION:   Progressive quad strengthening.    Chyrel Masson, PT, DPT, OCS, ATC 02/23/23  12:22 PM

## 2023-02-28 ENCOUNTER — Ambulatory Visit: Payer: BC Managed Care – PPO | Admitting: Physical Therapy

## 2023-02-28 ENCOUNTER — Encounter: Payer: Self-pay | Admitting: Physical Therapy

## 2023-02-28 DIAGNOSIS — R600 Localized edema: Secondary | ICD-10-CM

## 2023-02-28 DIAGNOSIS — R262 Difficulty in walking, not elsewhere classified: Secondary | ICD-10-CM

## 2023-02-28 DIAGNOSIS — M6281 Muscle weakness (generalized): Secondary | ICD-10-CM

## 2023-02-28 DIAGNOSIS — M25662 Stiffness of left knee, not elsewhere classified: Secondary | ICD-10-CM

## 2023-02-28 DIAGNOSIS — M25562 Pain in left knee: Secondary | ICD-10-CM | POA: Diagnosis not present

## 2023-02-28 DIAGNOSIS — G8929 Other chronic pain: Secondary | ICD-10-CM

## 2023-02-28 NOTE — Therapy (Signed)
OUTPATIENT PHYSICAL THERAPY TREATMENT   Patient Name: James Bird MRN: [id number] DOB:30-Mar-1961, 62 y.o., male Today's Date: 02/28/2023  END OF SESSION:  PT End of Session - 02/28/23 1258     Visit Number 5    Number of Visits 20    Date for PT Re-Evaluation 04/20/23    Authorization Type BCBS state $52 copay    Progress Note Due on Visit 10    PT Start Time 1145    PT Stop Time 1235    PT Time Calculation (min) 50 min    Activity Tolerance Patient tolerated treatment well    Behavior During Therapy [facility] for tasks assessed/performed                Past Medical History:  Diagnosis Date   Allergy    RHINITS   Chronic shoulder pain    left   Constipation 2019   Diverticulosis    ED (erectile dysfunction)    Former smoker    quit late 2018   H/O echocardiogram 03/20/08   mild - moderate LVH, EF 50-55%, mild RV enlargement, mild mitral regurgitation, Dr. 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 Reyes Ivan   History of neck surgery    Hyperlipidemia    Hypertension    Impaired fasting glucose    Past Surgical History:  Procedure Laterality Date   COLONOSCOPY  10/2010   diverticulosis, othewrise normal, repeat 10 years, Dr. Marina Goodell   SPINE SURGERY  2011(JENKINS,MD)   CERVICAL DISC AND FUSION   TOTAL KNEE ARTHROPLASTY Left 01/23/2023   Procedure: LEFT TOTAL KNEE ARTHROPLASTY;  Surgeon: Tarry Kos, MD;  Location: [facility] OR;  Service: Orthopedics;  Laterality: Left;   Patient Active Problem List   Diagnosis Date Noted   Status post total left knee replacement 01/23/2023   History of smoking 10-25 pack years 08/24/2021   History of colonic polyps 08/24/2021   H/O cervical discectomy 08/24/2021   Constipation 04/16/2020   Family history of heart disease 02/21/2019   Paresthesia 05/24/2018   Elevated PSA 05/24/2018   Nail deformity 05/24/2018   Onychomycosis  05/24/2018   Tinea pedis 05/24/2018   Lumbar radiculopathy 04/25/2018   Primary osteoarthritis of left knee 03/29/2017   Clonus 03/29/2017   Right foot drop 03/29/2017   Routine general medical examination at a health care facility 12/14/2015   Obesity 12/11/2014   Hyperlipidemia 11/28/2012   Erectile dysfunction 11/28/2012   Essential hypertension, benign 11/02/2011   Impaired fasting blood sugar 11/02/2011    PCP: Ronnald Nian MD  REFERRING PROVIDER: Cristie Hem, PA-C  REFERRING DIAG: [phone] (ICD-10-CM) - Status post total left knee replacement  THERAPY DIAG:  Chronic pain of left knee  Muscle weakness (generalized)  Stiffness of left knee, not elsewhere classified  Difficulty in walking, not elsewhere classified  Localized edema  Rationale for Evaluation and Treatment: Rehabilitation  ONSET DATE: Surgery 01/23/2023  SUBJECTIVE:   SUBJECTIVE STATEMENT: Pt arriving today reporting increased swelling and tightness noted in left knee.     PERTINENT HISTORY: PMH includes diverticulosis, ED, herpes simplex, HLD, HTN.  PAIN:  NPRS scale: 7/10 Pain location: Lt knee  Pain description: stiffness, achy Aggravating factors: static positioning, WB activity, end ranges Relieving factors: moving around, ice, stretching, medicine  PRECAUTIONS: None  WEIGHT BEARING RESTRICTIONS: No  FALLS:  Has patient fallen in  last 6 months? No  LIVING ENVIRONMENT: Lives with: lives alone Lives in: House/apartment Stairs: has stairs for laundry.  3 stairs with rails Has following equipment at home: FWW,   OCCUPATION: Database admin.  Computer work.    PLOF: Independent, racquet ball  PATIENT GOALS: Reduce pain  OBJECTIVE:   PATIENT SURVEYS:  02/09/2023 FOTO intake:  36  predicted:  59  COGNITION: 02/09/2023 Overall cognitive status: WFL    SENSATION: 02/09/2023 No specific testing today  EDEMA:  02/09/2023 Localized edema noted Lt knee/Lt lower  leg  MUSCLE LENGTH: 02/09/2023 No specific testing today  POSTURE:  02/09/2023 Difficulty in sitting, lying down tolerance to static postures.    PALPATION: 02/09/2023 Incision  LOWER EXTREMITY ROM:   ROM Left 02/09/2023 Left 02/15/2023 Left 02/23/2023 Left 03/10/23  Hip flexion      Hip extension      Hip abduction      Hip adduction      Hip internal rotation      Hip external rotation      Knee flexion 86 AROM in supine heel slide 106 AROM in supine heel slide  A: 110 supine  Knee extension -28 AROM in seated LAQ -7 In supine heel prop -11 AROM in seated LAQ Extension lag in SLR seated -12 A: -10 P:   Ankle dorsiflexion      Ankle plantarflexion      Ankle inversion      Ankle eversion       (Blank rows = not tested)  LOWER EXTREMITY MMT:  MMT Right 02/09/23 Left 02/09/23  Hip flexion 5/5 4/5  Hip extension    Hip abduction    Hip adduction    Hip internal rotation    Hip external rotation    Knee flexion 5/5 4/5  Knee extension 5/5 2+/5  Ankle dorsiflexion 5/5 5/5  Ankle plantarflexion    Ankle inversion    Ankle eversion     (Blank rows = not tested)  LOWER EXTREMITY SPECIAL TESTS:  02/09/2023 No specific testing   FUNCTIONAL TESTS:  02/09/2023 18 inch chair transfer: unable s UE assist  GAIT: 02/23/2023:  SPC use in clinic c maintained knee flexion in stance, reduced stance.   02/09/2023 Modified independent ambulation c FWW with reduced stance on Lt leg, reduce toe off progression, maintained knee flexion in stance, lacking full TKE                                                                                                                                                                         TODAY'S  TREATMENT  DATE: 02/28/2023 Therapeutic Exercise: SciFit bike LEs only seat 8 level 3 for 8 min Gastro stretch on slant board x 3 holding 30 sec Step ups 4 inch step x 15  leading c Left LE Step ups on 6 inch step x 10 leading with Left LE Seated Lt quad set c SLR 3 x 5  Leg press double leg 75 lbs 2 x15, Lt leg only 37 lbs 2 x 10 Seated LAQ: 2 x 10 2 #   Vasopneumatic   Left knee medium compression 34*  x 15 minutes with elevation.  Heel prop trial start of treatment.     TODAY'S  TREATMENT                                                                          DATE: 02/23/2023 Therapeutic Exercise: SciFit bike LEs only seat 11 level 3 for 8 min Seated Lt quad set 5 sec hold x 5 Seated Lt quad set c SLR 3 x 5  Leg press double leg 75 lbs x15, Lt leg only 25 lbs x 15 Incline gastroc stretch 30 sec x 3 bilateral Supine heel slide/quad set combo Lt leg 5 sec hold each x 10 Supine LAQ in 90 deg hip flexion Lt leg x 15  Vasopneumatic   Left knee medium compression 34* 10 minutes with elevation.  Heel prop trial start of treatment.   TODAY'S  TREATMENT                                                                          DATE: 02/15/2023 Therapeutic Exercise: SciFit bike LEs only seat 13 level 1 for 8 min Incline gastroc stretch 30 sec x 3 bilateral 6 inch hurdle step over and back with hand on bar x 15 bilateral (for flexion gains, WB acceptance on leg) Seated LAQ Lt leg c pause in end range 2 sec each way 2 x 15 Lt leg supine quad set c heel slide combo 5 sec hold x 10  Supine heel prop to tolerance with start of vaso:  5 mins   Neuro Re-ed [employer] pew anterior/posterior weight shift 2 mins, time for cues for techniques  Vasopneumatic   Left knee medium compression 34* 10 minutes with elevation.  Heel prop trial start of treatment.      PATIENT EDUCATION:  02/09/2023 Education details: HEP, POC Person educated: Patient Education method: Programmer, multimedia, Demonstration, Verbal cues, and Handouts Education comprehension: verbalized understanding, returned demonstration, and verbal cues required  HOME EXERCISE PROGRAM: Access Code: [email or web address] URL:  [email or web address] Date: 02/13/2023 Prepared by: Vladimir Faster  Exercises - Supine Heel Slide (Mirrored)  - 3-5 x daily - 7 x weekly - 2-3 sets - 10 reps - 5 hold - Supine Knee Extension Mobilization with Weight (Mirrored)  - 4-5 x daily - 7 x weekly - 2-3 sets - 1 reps - to tolerance up to 15 mins hold - Supine Quadricep Sets  - 3-5 x  daily - 7 x weekly - 2-3 sets - 10 reps - 5 hold - Seated Quad Set (Mirrored)  - 3-5 x daily - 7 x weekly - 2-3 sets - 10 reps - 5 hold - Seated Knee Flexion Extension AROM   - 3-5 x daily - 7 x weekly - 2-3 sets - 10 reps - 5 seconds hold - Seated Long Arc Quad (Mirrored)  - 3-5 x daily - 7 x weekly - 2-3 sets - 5-10 reps - 2 hold - Seated Passive Knee Extension  - 3-5 x daily - 7 x weekly - 2-3 sets - 1 reps - 1-2 minutes hold  ASSESSMENT:  CLINICAL IMPRESSION: Pt reporting increased pain and swelling upon arrival. Pt has improved his left knee strength with no extensor lag noted with SLR. Pt still amb with a st cane on community surfaces  and using no device for short distances.  Pt's active left knee ROM arc of motion is 10 to 110 degrees. Continue with skilled PT interventions to maximize function.   OBJECTIVE IMPAIRMENTS: Abnormal gait, decreased balance, decreased coordination, decreased endurance, decreased mobility, difficulty walking, decreased ROM, decreased strength, hypomobility, increased edema, increased fascial restrictions, impaired perceived functional ability, impaired flexibility, improper body mechanics, and pain.   ACTIVITY LIMITATIONS: carrying, lifting, bending, sitting, standing, squatting, sleeping, stairs, transfers, bed mobility, dressing, and locomotion level  PARTICIPATION LIMITATIONS: cleaning, laundry, interpersonal relationship, driving, shopping, and community activity  PERSONAL FACTORS:  PMH includes diverticulosis, ED, herpes simplex, HLD, HTN.  are also affecting patient's functional outcome.   REHAB  POTENTIAL: Good  CLINICAL DECISION MAKING: Stable/uncomplicated  EVALUATION COMPLEXITY: Low   GOALS: Goals reviewed with patient? Yes  SHORT TERM GOALS: (target date for Short term goals are 3 weeks 03/02/2023)   1.  Patient will demonstrate independent use of home exercise program to maintain progress from in clinic treatments.  Goal status: MET 02/28/23  LONG TERM GOALS: (target dates for all long term goals are 10 weeks  04/20/2023 )   1. Patient will demonstrate/report pain at worst less than or equal to 2/10 to facilitate minimal limitation in daily activity secondary to pain symptoms.  Goal status: New   2. Patient will demonstrate independent use of home exercise program to facilitate ability to maintain/progress functional gains from skilled physical therapy services.  Goal status: New   3. Patient will demonstrate FOTO outcome > or = 59 % to indicate reduced disability due to condition.  Goal status: New   4.  Patient will demonstrate Lt LE MMT 5/5 throughout to faciltiate usual transfers, stairs, squatting at [facility] for daily life.   Goal status: New   5.  Patient will demonstrate independent ambulation community distances > 300 ft for usual mobility.  Goal status: New   6.  Patient will demonstrate ascending/descending stairs reciprocally s UE assist for community integration.   Goal status: New   7.  Patient will demonstrate Lt knee AROM 0-110 deg to facilitate transfers, ambulation and other daily activity at PLOF.  Goal Status: New   PLAN:  PT FREQUENCY: 1-3x/week  PT DURATION: 10 weeks  PLANNED INTERVENTIONS: Therapeutic exercises, Therapeutic activity, Neuro Muscular re-education, Balance training, Gait training, Patient/Family education, Joint mobilization, Stair training, DME instructions, Dry Needling, Electrical stimulation, Traction, Cryotherapy, vasopneumatic deviceMoist heat, Taping, Ultrasound, Ionotophoresis 4mg /ml Dexamethasone, and aquatic  therapy, Manual therapy.  All included unless contraindicated  PLAN FOR NEXT SESSION:   Progressive quad strengthening, focus knee extension, due to pt's high co-pay his  visits may need to be limited.    Narda Amber, PT, MPT 02/28/23 12:59 PM   02/28/23  12:59 PM

## 2023-03-02 ENCOUNTER — Ambulatory Visit: Payer: BC Managed Care – PPO | Admitting: Rehabilitative and Restorative Service Providers"

## 2023-03-02 ENCOUNTER — Encounter: Payer: Self-pay | Admitting: Rehabilitative and Restorative Service Providers"

## 2023-03-02 DIAGNOSIS — R600 Localized edema: Secondary | ICD-10-CM

## 2023-03-02 DIAGNOSIS — M6281 Muscle weakness (generalized): Secondary | ICD-10-CM | POA: Diagnosis not present

## 2023-03-02 DIAGNOSIS — M25662 Stiffness of left knee, not elsewhere classified: Secondary | ICD-10-CM

## 2023-03-02 DIAGNOSIS — M25562 Pain in left knee: Secondary | ICD-10-CM | POA: Diagnosis not present

## 2023-03-02 DIAGNOSIS — R262 Difficulty in walking, not elsewhere classified: Secondary | ICD-10-CM | POA: Diagnosis not present

## 2023-03-02 DIAGNOSIS — G8929 Other chronic pain: Secondary | ICD-10-CM

## 2023-03-02 NOTE — Therapy (Signed)
OUTPATIENT PHYSICAL THERAPY TREATMENT   Patient Name: James Bird MRN: [id number] DOB:[birth year], 62 y.o., male Today's Date: 03/02/2023  END OF SESSION:  PT End of Session - 03/02/23 1149     Visit Number 6    Number of Visits 20    Date for PT Re-Evaluation 04/20/23    Authorization Type [employer] state $52 copay    Progress Note Due on Visit 10    PT Start Time 1141    PT Stop Time 1231    PT Time Calculation (min) 50 min    Activity Tolerance Patient tolerated treatment well    Behavior During Therapy [facility] for tasks assessed/performed                 Past Medical History:  Diagnosis Date   Allergy    RHINITS   Chronic shoulder pain    left   Constipation 2019   Diverticulosis    ED (erectile dysfunction)    Former smoker    quit late 2018   H/O echocardiogram 03/20/08   mild - moderate LVH, EF 50-55%, mild RV enlargement, mild mitral regurgitation, Dr. 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 Reyes Ivan   History of neck surgery    Hyperlipidemia    Hypertension    Impaired fasting glucose    Past Surgical History:  Procedure Laterality Date   COLONOSCOPY  10/2010   diverticulosis, othewrise normal, repeat 10 years, Dr. Marina Goodell   SPINE SURGERY  2011(JENKINS,MD)   CERVICAL DISC AND FUSION   TOTAL KNEE ARTHROPLASTY Left 01/23/2023   Procedure: LEFT TOTAL KNEE ARTHROPLASTY;  Surgeon: Tarry Kos, MD;  Location: [facility] OR;  Service: Orthopedics;  Laterality: Left;   Patient Active Problem List   Diagnosis Date Noted   Status post total left knee replacement 01/23/2023   History of smoking 10-25 pack years 08/24/2021   History of colonic polyps 08/24/2021   H/O cervical discectomy 08/24/2021   Constipation 04/16/2020   Family history of heart disease 02/21/2019   Paresthesia 05/24/2018   Elevated PSA 05/24/2018   Nail deformity 05/24/2018   Onychomycosis  05/24/2018   Tinea pedis 05/24/2018   Lumbar radiculopathy 04/25/2018   Primary osteoarthritis of left knee 03/29/2017   Clonus 03/29/2017   Right foot drop 03/29/2017   Routine general medical examination at a health care facility 12/14/2015   Obesity 12/11/2014   Hyperlipidemia 11/28/2012   Erectile dysfunction 11/28/2012   Essential hypertension, benign 11/02/2011   Impaired fasting blood sugar 11/02/2011    PCP: Ronnald Nian MD  REFERRING PROVIDER: Cristie Hem, PA-C  REFERRING DIAG: [phone] (ICD-10-CM) - Status post total left knee replacement  THERAPY DIAG:  Chronic pain of left knee  Muscle weakness (generalized)  Stiffness of left knee, not elsewhere classified  Difficulty in walking, not elsewhere classified  Localized edema  Rationale for Evaluation and Treatment: Rehabilitation  ONSET DATE: Surgery 01/23/2023  SUBJECTIVE:   SUBJECTIVE STATEMENT: He reported some complaints with prolonged walking at store.  He indicated feeling better than last visit arrival.  He indicated he has been working on stretching for straight.   PERTINENT HISTORY: PMH includes diverticulosis, ED, herpes simplex, HLD, HTN.  PAIN:  NPRS scale: 4/10 Pain location: Lt knee  Pain description: stiffness, achy Aggravating factors: static positioning, WB activity, end ranges Relieving factors: moving around, ice, stretching,  medicine  PRECAUTIONS: None  WEIGHT BEARING RESTRICTIONS: No  FALLS:  Has patient fallen in last 6 months? No  LIVING ENVIRONMENT: Lives with: lives alone Lives in: House/apartment Stairs: has stairs for laundry.  3 stairs with rails Has following equipment at home: FWW,   OCCUPATION: Database admin.  Computer work.    PLOF: Independent, racquet ball  PATIENT GOALS: Reduce pain  OBJECTIVE:   PATIENT SURVEYS:  02/09/2023 FOTO intake:  36  predicted:  59  COGNITION: 02/09/2023 Overall cognitive status: WFL    SENSATION: 02/09/2023 No  specific testing today  EDEMA:  02/09/2023 Localized edema noted Lt knee/Lt lower leg  MUSCLE LENGTH: 02/09/2023 No specific testing today  POSTURE:  02/09/2023 Difficulty in sitting, lying down tolerance to static postures.    PALPATION: 02/09/2023 Incision  LOWER EXTREMITY ROM:   ROM Left 02/09/2023 Left 02/15/2023 Left 02/23/2023 Left 02/28/23 Left 03/02/2023  Hip flexion       Hip extension       Hip abduction       Hip adduction       Hip internal rotation       Hip external rotation       Knee flexion 86 AROM in supine heel slide 106 AROM in supine heel slide  A: 110 supine AROM heel slide supine  120  Knee extension -28 AROM in seated LAQ -7 In supine heel prop -11 AROM in seated LAQ Extension lag in SLR seated -12 A: -10 P:  Seated AROM LAQ -7  Ankle dorsiflexion       Ankle plantarflexion       Ankle inversion       Ankle eversion        (Blank rows = not tested)  LOWER EXTREMITY MMT:  MMT Right 02/09/23 Left 02/09/23 Right 03/02/2023 Left 03/02/2023  Hip flexion 5/5 4/5  5/5  Hip extension      Hip abduction      Hip adduction      Hip internal rotation      Hip external rotation      Knee flexion 5/5 4/5  5/5  Knee extension 5/5 2+/5 5/5 66.9, 66.9 lbs 5/5 42, 47 lbs   Ankle dorsiflexion 5/5 5/5    Ankle plantarflexion      Ankle inversion      Ankle eversion       (Blank rows = not tested)  LOWER EXTREMITY SPECIAL TESTS:  02/09/2023 No specific testing   FUNCTIONAL TESTS:  02/09/2023 18 inch chair transfer: unable s UE assist  GAIT: 02/23/2023:  SPC use in clinic c maintained knee flexion in stance, reduced stance.   02/09/2023 Modified independent ambulation c FWW with reduced stance on Lt leg, reduce toe off progression, maintained knee flexion in stance, lacking full TKE  TODAY'S  TREATMENT                                                                          DATE: 03/02/2023 Therapeutic Exercise: Sci[employer] bike LEs only seat 12 lvl 3.0 8 mins Incline gastroc stretch in runner stretch Lt leg posterior c quad set hold 5 x 30 secs  Step up forward on Lt leg with single on hand assist 6 inch step 2 x 10  Seated Lt leg LAQ x 15 with pause in end ranges  Manual Therapy Prone TKE stretch c percussive device to Lt hamstring for extension mobility gains.    Neuro Re-ed SLS with contralateral leg clearing over and back 9 inch hurdle x 15 bilateral c occasional HHA  Vasopneumatic   Left knee medium compression 34* 10 minutes with elevation.  Heel prop trial start of treatment.   TODAY'S  TREATMENT                                                                          DATE: 02/28/2023 Therapeutic Exercise: Sci[employer] bike LEs only seat 8 level 3 for 8 min Gastro stretch on slant board x 3 holding 30 sec Step ups 4 inch step x 15 leading c Left LE Step ups on 6 inch step x 10 leading with Left LE Seated Lt quad set c SLR 3 x 5  Leg press double leg 75 lbs 2 x15, Lt leg only 37 lbs 2 x 10 Seated LAQ: 2 x 10 2 #   Vasopneumatic   Left knee medium compression 34*  x 15 minutes with elevation.  Heel prop trial start of treatment.     TODAY'S  TREATMENT                                                                          DATE: 02/23/2023 Therapeutic Exercise: SciFit bike LEs only seat 11 level 3 for 8 min Seated Lt quad set 5 sec hold x 5 Seated Lt quad set c SLR 3 x 5  Leg press double leg 75 lbs x15, Lt leg only 25 lbs x 15 Incline gastroc stretch 30 sec x 3 bilateral Supine heel slide/quad set combo Lt leg 5 sec hold each x 10 Supine LAQ in 90 deg hip flexion Lt leg x 15  Vasopneumatic   Left knee medium compression 34* 10 minutes with elevation.  Heel prop trial start of treatment.   TODAY'S  TREATMENT  DATE: 02/15/2023 Therapeutic Exercise: SciFit bike LEs only seat 13 level 1 for 8 min Incline gastroc stretch 30 sec x 3 bilateral 6 inch hurdle step over and back with hand on bar x 15 bilateral (for flexion gains, WB acceptance on leg) Seated LAQ Lt leg c pause in end range 2 sec each way 2 x 15 Lt leg supine quad set c heel slide combo 5 sec hold x 10  Supine heel prop to tolerance with start of vaso:  5 mins   Neuro Re-ed [place] pew anterior/posterior weight shift 2 mins, time for cues for techniques  Vasopneumatic   Left knee medium compression 34* 10 minutes with elevation.  Heel prop trial start of treatment.      PATIENT EDUCATION:  02/09/2023 Education details: HEP, POC Person educated: Patient Education method: Programmer, multimedia, Demonstration, Verbal cues, and Handouts Education comprehension: verbalized understanding, returned demonstration, and verbal cues required  HOME EXERCISE PROGRAM: Access Code: [email or web address] URL: [email or web address] Date: 02/13/2023 Prepared by: Vladimir Faster  Exercises - Supine Heel Slide (Mirrored)  - 3-5 x daily - 7 x weekly - 2-3 sets - 10 reps - 5 hold - Supine Knee Extension Mobilization with Weight (Mirrored)  - 4-5 x daily - 7 x weekly - 2-3 sets - 1 reps - to tolerance up to 15 mins hold - Supine Quadricep Sets  - 3-5 x daily - 7 x weekly - 2-3 sets - 10 reps - 5 hold - Seated Quad Set (Mirrored)  - 3-5 x daily - 7 x weekly - 2-3 sets - 10 reps - 5 hold - Seated Knee Flexion Extension AROM   - 3-5 x daily - 7 x weekly - 2-3 sets - 10 reps - 5 seconds hold - Seated Long Arc Quad (Mirrored)  - 3-5 x daily - 7 x weekly - 2-3 sets - 5-10 reps - 2 hold - Seated Passive Knee Extension  - 3-5 x daily - 7 x weekly - 2-3 sets - 1 reps - 1-2 minutes hold  ASSESSMENT:  CLINICAL IMPRESSION: Pt has attended 6 visits overall.  See objective data for updated information.  Progression noted in all areas to this point.   Continued extension mobility gains and strengthening for WB activity indicated at this time.     OBJECTIVE IMPAIRMENTS: Abnormal gait, decreased balance, decreased coordination, decreased endurance, decreased mobility, difficulty walking, decreased ROM, decreased strength, hypomobility, increased edema, increased fascial restrictions, impaired perceived functional ability, impaired flexibility, improper body mechanics, and pain.   ACTIVITY LIMITATIONS: carrying, lifting, bending, sitting, standing, squatting, sleeping, stairs, transfers, bed mobility, dressing, and locomotion level  PARTICIPATION LIMITATIONS: cleaning, laundry, interpersonal relationship, driving, shopping, and community activity  PERSONAL FACTORS:  PMH includes diverticulosis, ED, herpes simplex, HLD, HTN.  are also affecting patient's functional outcome.   REHAB POTENTIAL: Good  CLINICAL DECISION MAKING: Stable/uncomplicated  EVALUATION COMPLEXITY: Low   GOALS: Goals reviewed with patient? Yes  SHORT TERM GOALS: (target date for Short term goals are 3 weeks 03/02/2023)   1.  Patient will demonstrate independent use of home exercise program to maintain progress from in clinic treatments.  Goal status: MET 02/28/23  LONG TERM GOALS: (target dates for all long term goals are 10 weeks  04/20/2023 )   1. Patient will demonstrate/report pain at worst less than or equal to 2/10 to facilitate minimal limitation in daily activity secondary to pain symptoms.  Goal status: on going 03/02/2023   2. Patient will demonstrate independent use of home exercise program  to facilitate ability to maintain/progress functional gains from skilled physical therapy services.  Goal status: on going 03/02/2023   3. Patient will demonstrate FOTO outcome > or = 59 % to indicate reduced disability due to condition.  Goal status: on going 03/02/2023   4.  Patient will demonstrate Lt LE MMT 5/5 throughout to faciltiate usual transfers, stairs,  squatting at [facility] for daily life.   Goal status: Met 03/02/2023 by MMT.  Dynamometry not equal for extension knee.    5.  Patient will demonstrate independent ambulation community distances > 300 ft for usual mobility.  Goal status: on going 03/02/2023   6.  Patient will demonstrate ascending/descending stairs reciprocally s UE assist for community integration.   Goal status: on going 03/02/2023   7.  Patient will demonstrate Lt knee AROM 0-110 deg to facilitate transfers, ambulation and other daily activity at P[facility].  Goal Status: on going 03/02/2023   PLAN:  PT FREQUENCY: 1-3x/week  PT DURATION: 10 weeks  PLANNED INTERVENTIONS: Therapeutic exercises, Therapeutic activity, Neuro Muscular re-education, Balance training, Gait training, Patient/Family education, Joint mobilization, Stair training, DME instructions, Dry Needling, Electrical stimulation, Traction, Cryotherapy, vasopneumatic deviceMoist heat, Taping, Ultrasound, Ionotophoresis 4mg /ml Dexamethasone, and aquatic therapy, Manual therapy.  All included unless contraindicated  PLAN FOR NEXT SESSION:   Extension mobility gains, WB strengthening as able.   Recheck for MD report.    Chyrel Masson, PT, DPT, OCS, ATC 03/02/23  12:26 PM

## 2023-03-06 ENCOUNTER — Encounter: Payer: Self-pay | Admitting: Physical Therapy

## 2023-03-07 ENCOUNTER — Encounter: Payer: Self-pay | Admitting: Physical Therapy

## 2023-03-07 ENCOUNTER — Ambulatory Visit (INDEPENDENT_AMBULATORY_CARE_PROVIDER_SITE_OTHER): Payer: BC Managed Care – PPO

## 2023-03-07 ENCOUNTER — Ambulatory Visit (INDEPENDENT_AMBULATORY_CARE_PROVIDER_SITE_OTHER): Payer: BC Managed Care – PPO | Admitting: Physician Assistant

## 2023-03-07 ENCOUNTER — Encounter: Payer: Self-pay | Admitting: Physician Assistant

## 2023-03-07 ENCOUNTER — Ambulatory Visit: Payer: BC Managed Care – PPO | Admitting: Physical Therapy

## 2023-03-07 DIAGNOSIS — M6281 Muscle weakness (generalized): Secondary | ICD-10-CM

## 2023-03-07 DIAGNOSIS — G8929 Other chronic pain: Secondary | ICD-10-CM

## 2023-03-07 DIAGNOSIS — R262 Difficulty in walking, not elsewhere classified: Secondary | ICD-10-CM | POA: Diagnosis not present

## 2023-03-07 DIAGNOSIS — Z96652 Presence of left artificial knee joint: Secondary | ICD-10-CM

## 2023-03-07 DIAGNOSIS — M25562 Pain in left knee: Secondary | ICD-10-CM | POA: Diagnosis not present

## 2023-03-07 DIAGNOSIS — M25662 Stiffness of left knee, not elsewhere classified: Secondary | ICD-10-CM

## 2023-03-07 DIAGNOSIS — R600 Localized edema: Secondary | ICD-10-CM

## 2023-03-07 NOTE — Therapy (Signed)
OUTPATIENT PHYSICAL THERAPY TREATMENT   Patient Name: James Bird MRN: [id number] DOB:1961/06/01, 62 y.o., male Today's Date: 03/07/2023  END OF SESSION:  PT End of Session - 03/07/23 1025     Visit Number 7    Number of Visits 20    Date for PT Re-Evaluation 04/20/23    Authorization Type [employer] state $52 copay    PT Start Time 0945    PT Stop Time 1030    PT Time Calculation (min) 45 min    Activity Tolerance Patient tolerated treatment well    Behavior During Therapy [facility] for tasks assessed/performed                  Past Medical History:  Diagnosis Date   Allergy    RHINITS   Chronic shoulder pain    left   Constipation 2019   Diverticulosis    ED (erectile dysfunction)    Former smoker    quit late 2018   H/O echocardiogram 03/20/08   mild - moderate LVH, EF 50-55%, mild RV enlargement, mild mitral regurgitation, Dr. 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 Reyes Ivan   History of neck surgery    Hyperlipidemia    Hypertension    Impaired fasting glucose    Past Surgical History:  Procedure Laterality Date   COLONOSCOPY  10/2010   diverticulosis, othewrise normal, repeat 10 years, Dr. Marina Goodell   SPINE SURGERY  2011(JENKINS,MD)   CERVICAL DISC AND FUSION   TOTAL KNEE ARTHROPLASTY Left 01/23/2023   Procedure: LEFT TOTAL KNEE ARTHROPLASTY;  Surgeon: Tarry Kos, MD;  Location: [facility] OR;  Service: Orthopedics;  Laterality: Left;   Patient Active Problem List   Diagnosis Date Noted   Status post total left knee replacement 01/23/2023   History of smoking 10-25 pack years 08/24/2021   History of colonic polyps 08/24/2021   H/O cervical discectomy 08/24/2021   Constipation 04/16/2020   Family history of heart disease 02/21/2019   Paresthesia 05/24/2018   Elevated PSA 05/24/2018   Nail deformity 05/24/2018   Onychomycosis 05/24/2018   Tinea pedis  05/24/2018   Lumbar radiculopathy 04/25/2018   Primary osteoarthritis of left knee 03/29/2017   Clonus 03/29/2017   Right foot drop 03/29/2017   Routine general medical examination at a health care facility 12/14/2015   Obesity 12/11/2014   Hyperlipidemia 11/28/2012   Erectile dysfunction 11/28/2012   Essential hypertension, benign 11/02/2011   Impaired fasting blood sugar 11/02/2011    PCP: Ronnald Nian MD  REFERRING PROVIDER: Cristie Hem, PA-C  REFERRING DIAG: [phone] (ICD-10-CM) - Status post total left knee replacement  THERAPY DIAG:  Chronic pain of left knee  Muscle weakness (generalized)  Stiffness of left knee, not elsewhere classified  Difficulty in walking, not elsewhere classified  Localized edema  Rationale for Evaluation and Treatment: Rehabilitation  ONSET DATE: Surgery 01/23/2023  SUBJECTIVE:   SUBJECTIVE STATEMENT: Pt came to therapy following his MD visit. Pt with good report. We were able to work pt in due to a pt not showing up for their appointment.   PERTINENT HISTORY: PMH includes diverticulosis, ED, herpes simplex, HLD, HTN.  PAIN:  NPRS scale: 3-4/10 Pain location: Lt knee  Pain description: stiffness, achy Aggravating factors: static positioning, WB activity, end ranges Relieving factors: moving around, ice, stretching, medicine  PRECAUTIONS: None  WEIGHT BEARING RESTRICTIONS:  No  FALLS:  Has patient fallen in last 6 months? No  LIVING ENVIRONMENT: Lives with: lives alone Lives in: House/apartment Stairs: has stairs for laundry.  3 stairs with rails Has following equipment at home: FWW,   OCCUPATION: Database admin.  Computer work.    PLOF: Independent, racquet ball  PATIENT GOALS: Reduce pain  OBJECTIVE:   PATIENT SURVEYS:  02/09/2023 FOTO intake:  36  predicted:  59  COGNITION: 02/09/2023 Overall cognitive status: WFL    SENSATION: 02/09/2023 No specific testing today  EDEMA:  02/09/2023 Localized edema  noted Lt knee/Lt lower leg  MUSCLE LENGTH: 02/09/2023 No specific testing today  POSTURE:  02/09/2023 Difficulty in sitting, lying down tolerance to static postures.    PALPATION: 02/09/2023 Incision  LOWER EXTREMITY ROM:   ROM Left 02/09/2023 Left 02/15/2023 Left 02/23/2023 Left 02/28/23 Left 03/02/2023 Left 03/07/23  Hip flexion        Hip extension        Hip abduction        Hip adduction        Hip internal rotation        Hip external rotation        Knee flexion 86 AROM in supine heel slide 106 AROM in supine heel slide  A: 110 supine AROM heel slide supine  120 A: 115  Knee extension -28 AROM in seated LAQ -7 In supine heel prop -11 AROM in seated LAQ Extension lag in SLR seated -12 A: -10 P:  Seated AROM LAQ -7 A: -5 P: 0  Ankle dorsiflexion        Ankle plantarflexion        Ankle inversion        Ankle eversion         (Blank rows = not tested)  LOWER EXTREMITY MMT:  MMT Right 02/09/23 Left 02/09/23 Right 03/02/2023 Left 03/02/2023  Hip flexion 5/5 4/5  5/5  Hip extension      Hip abduction      Hip adduction      Hip internal rotation      Hip external rotation      Knee flexion 5/5 4/5  5/5  Knee extension 5/5 2+/5 5/5 66.9, 66.9 lbs 5/5 42, 47 lbs   Ankle dorsiflexion 5/5 5/5    Ankle plantarflexion      Ankle inversion      Ankle eversion       (Blank rows = not tested)  LOWER EXTREMITY SPECIAL TESTS:  02/09/2023 No specific testing   FUNCTIONAL TESTS:  02/09/2023 18 inch chair transfer: unable s UE assist  GAIT: 02/23/2023:  SPC use in clinic c maintained knee flexion in stance, reduced stance.   02/09/2023 Modified independent ambulation c FWW with reduced stance on Lt leg, reduce toe off progression, maintained knee flexion in stance, lacking full TKE  TODAY'S  TREATMENT                                                                           DATE: 03/07/2023 Therapeutic Exercise: Sci[employer] bike LEs only seat 11 lvl 3.0 6 mins Leg Press: bil 125# x 20, Left LE only: 75# x 30 Seated Lt leg LAQ x 15 c 5# Sit to stand: x 10 from low mat table, x 10 with Rt foot in in more forward postion.  Neuro Re-ed Vectors: sliding disc toward 2 cones placed ant/lat, post/lat x 15 c single UE support Vasopneumatic   Left knee medium compression 34* 10 minutes with elevation.  Heel prop trial start of treatment.    TODAY'S  TREATMENT                                                                          DATE: 03/02/2023 Therapeutic Exercise: SciFit bike LEs only seat 12 lvl 3.0 8 mins Incline gastroc stretch in runner stretch Lt leg posterior c quad set hold 5 x 30 secs  Step up forward on Lt leg with single on hand assist 6 inch step 2 x 10  Seated Lt leg LAQ x 15 with pause in end ranges  Manual Therapy Prone TKE stretch c percussive device to Lt hamstring for extension mobility gains.    Neuro Re-ed SLS with contralateral leg clearing over and back 9 inch hurdle x 15 bilateral c occasional HHA  Vasopneumatic   Left knee medium compression 34* 10 minutes with elevation.  Heel prop trial start of treatment.   TODAY'S  TREATMENT                                                                          DATE: 02/28/2023 Therapeutic Exercise: SciFit bike LEs only seat 8 level 3 for 8 min Gastro stretch on slant board x 3 holding 30 sec Step ups 4 inch step x 15 leading c Left LE Step ups on 6 inch step x 10 leading with Left LE Seated Lt quad set c SLR 3 x 5  Leg press double leg 75 lbs 2 x15, Lt leg only 37 lbs 2 x 10 Seated LAQ: 2 x 10 2 #   Vasopneumatic   Left knee medium compression 34*  x 15 minutes with elevation.  Heel prop trial start of treatment.     TODAY'S  TREATMENT  DATE:  02/23/2023 Therapeutic Exercise: SciFit bike LEs only seat 11 level 3 for 8 min Seated Lt quad set 5 sec hold x 5 Seated Lt quad set c SLR 3 x 5  Leg press double leg 75 lbs x15, Lt leg only 25 lbs x 15 Incline gastroc stretch 30 sec x 3 bilateral Supine heel slide/quad set combo Lt leg 5 sec hold each x 10 Supine LAQ in 90 deg hip flexion Lt leg x 15  Vasopneumatic   Left knee medium compression 34* 10 minutes with elevation.  Heel prop trial start of treatment.   TODAY'S  TREATMENT                                                                          DATE: 02/15/2023 Therapeutic Exercise: SciFit bike LEs only seat 13 level 1 for 8 min Incline gastroc stretch 30 sec x 3 bilateral 6 inch hurdle step over and back with hand on bar x 15 bilateral (for flexion gains, WB acceptance on leg) Seated LAQ Lt leg c pause in end range 2 sec each way 2 x 15 Lt leg supine quad set c heel slide combo 5 sec hold x 10  Supine heel prop to tolerance with start of vaso:  5 mins   Neuro Re-ed [place] pew anterior/posterior weight shift 2 mins, time for cues for techniques  Vasopneumatic   Left knee medium compression 34* 10 minutes with elevation.  Heel prop trial start of treatment.      PATIENT EDUCATION:  02/09/2023 Education details: HEP, POC Person educated: Patient Education method: Programmer, multimedia, Demonstration, Verbal cues, and Handouts Education comprehension: verbalized understanding, returned demonstration, and verbal cues required  HOME EXERCISE PROGRAM: Access Code: [email or web address] URL: [email or web address] Date: 02/13/2023 Prepared by: Vladimir Faster  Exercises - Supine Heel Slide (Mirrored)  - 3-5 x daily - 7 x weekly - 2-3 sets - 10 reps - 5 hold - Supine Knee Extension Mobilization with Weight (Mirrored)  - 4-5 x daily - 7 x weekly - 2-3 sets - 1 reps - to tolerance up to 15 mins hold - Supine Quadricep Sets  - 3-5 x daily - 7 x weekly - 2-3 sets - 10 reps - 5 hold -  Seated Quad Set (Mirrored)  - 3-5 x daily - 7 x weekly - 2-3 sets - 10 reps - 5 hold - Seated Knee Flexion Extension AROM   - 3-5 x daily - 7 x weekly - 2-3 sets - 10 reps - 5 seconds hold - Seated Long Arc Quad (Mirrored)  - 3-5 x daily - 7 x weekly - 2-3 sets - 5-10 reps - 2 hold - Seated Passive Knee Extension  - 3-5 x daily - 7 x weekly - 2-3 sets - 1 reps - 1-2 minutes hold  ASSESSMENT:  CLINICAL IMPRESSION: Pt continues to make progress with overall strength and ROM. Pt's left knee arc of motion is 5-115 degrees. Recommend continue skilled PT services.     OBJECTIVE IMPAIRMENTS: Abnormal gait, decreased balance, decreased coordination, decreased endurance, decreased mobility, difficulty walking, decreased ROM, decreased strength, hypomobility, increased edema, increased fascial restrictions, impaired perceived functional ability, impaired flexibility, improper body mechanics, and pain.   ACTIVITY LIMITATIONS: carrying,  lifting, bending, sitting, standing, squatting, sleeping, stairs, transfers, bed mobility, dressing, and locomotion level  PARTICIPATION LIMITATIONS: cleaning, laundry, interpersonal relationship, driving, shopping, and community activity  PERSONAL FACTORS:  PMH includes diverticulosis, ED, herpes simplex, HLD, HTN.  are also affecting patient's functional outcome.   REHAB POTENTIAL: Good  CLINICAL DECISION MAKING: Stable/uncomplicated  EVALUATION COMPLEXITY: Low   GOALS: Goals reviewed with patient? Yes  SHORT TERM GOALS: (target date for Short term goals are 3 weeks 03/02/2023)   1.  Patient will demonstrate independent use of home exercise program to maintain progress from in clinic treatments.  Goal status: MET 02/28/23  LONG TERM GOALS: (target dates for all long term goals are 10 weeks  04/20/2023 )   1. Patient will demonstrate/report pain at worst less than or equal to 2/10 to facilitate minimal limitation in daily activity secondary to pain  symptoms.  Goal status: on going 03/02/2023   2. Patient will demonstrate independent use of home exercise program to facilitate ability to maintain/progress functional gains from skilled physical therapy services.  Goal status: on going 03/02/2023   3. Patient will demonstrate FOTO outcome > or = 59 % to indicate reduced disability due to condition.  Goal status: on going 03/02/2023   4.  Patient will demonstrate Lt LE MMT 5/5 throughout to faciltiate usual transfers, stairs, squatting at [facility] for daily life.   Goal status: Met 03/02/2023 by MMT.  Dynamometry not equal for extension knee.    5.  Patient will demonstrate independent ambulation community distances > 300 ft for usual mobility.  Goal status: on going 03/02/2023   6.  Patient will demonstrate ascending/descending stairs reciprocally s UE assist for community integration.   Goal status: on going 03/02/2023   7.  Patient will demonstrate Lt knee AROM 0-110 deg to facilitate transfers, ambulation and other daily activity at P[facility].  Goal Status: on going 03/02/2023   PLAN:  PT FREQUENCY: 1-3x/week  PT DURATION: 10 weeks  PLANNED INTERVENTIONS: Therapeutic exercises, Therapeutic activity, Neuro Muscular re-education, Balance training, Gait training, Patient/Family education, Joint mobilization, Stair training, DME instructions, Dry Needling, Electrical stimulation, Traction, Cryotherapy, vasopneumatic deviceMoist heat, Taping, Ultrasound, Ionotophoresis 4mg /ml Dexamethasone, and aquatic therapy, Manual therapy.  All included unless contraindicated  PLAN FOR NEXT SESSION:   Extension mobility gains, WB strengthening as able.      Narda Amber, PT, MPT 03/07/23 10:26 AM   03/07/23  10:26 AM

## 2023-03-07 NOTE — Progress Notes (Signed)
Post-Op Visit Note   Patient: James Bird           Date of Birth: 07-May-1961           MRN: [id number] Visit Date: 03/07/2023 PCP: Ronnald Nian, MD   Assessment & Plan:  Chief Complaint:  Chief Complaint  Patient presents with   Left Knee - Follow-up    Left total knee arthroplasty 01/23/2023   Visit Diagnoses:  1. Status post total left knee replacement     Plan: Patient is a pleasant 62 year old gentleman who comes in today 6 weeks status post left total knee replacement 01/23/2023.  He has been doing well.  He is ambulating with a single-point cane.  He is still in physical therapy making good progress.  We backed down from 2-1 baby aspirin during the initial postop period due to bleeding.  He has since finished this.  He is taking primarily NSAIDs with an occasional oxycodone for pain.  Overall, doing well making progress.  Examination of his left knee reveals a fully healed surgical scar without complication.  Range of motion 5 to 125 degrees.  He is stable to valgus varus stress.  He is neurovascular intact distally.  At this point, he will continue with physical therapy.  He will follow-up with [place] in 6 weeks for repeat evaluation and possible repeat x-rays of the left knee to further assess the tibial tray lucency.  Call with concerns or questions in the meantime.  Follow-Up Instructions: Return in about 6 weeks (around 04/18/2023).   Orders:  Orders Placed This Encounter  Procedures   XR Knee 1-2 Views Left   No orders of the defined types were placed in this encounter.   Imaging: XR Knee 1-2 Views Left  Result Date: 03/07/2023 X-rays demonstrate questionable lucency to the medial aspect of the tibial tray.  Otherwise, stable implant.   PMFS History: Patient Active Problem List   Diagnosis Date Noted   Status post total left knee replacement 01/23/2023   History of smoking 10-25 pack years 08/24/2021   History of colonic polyps 08/24/2021   H/O cervical  discectomy 08/24/2021   Constipation 04/16/2020   Family history of heart disease 02/21/2019   Paresthesia 05/24/2018   Elevated PSA 05/24/2018   Nail deformity 05/24/2018   Onychomycosis 05/24/2018   Tinea pedis 05/24/2018   Lumbar radiculopathy 04/25/2018   Primary osteoarthritis of left knee 03/29/2017   Clonus 03/29/2017   Right foot drop 03/29/2017   Routine general medical examination at a health care facility 12/14/2015   Obesity 12/11/2014   Hyperlipidemia 11/28/2012   Erectile dysfunction 11/28/2012   Essential hypertension, benign 11/02/2011   Impaired fasting blood sugar 11/02/2011   Past Medical History:  Diagnosis Date   Allergy    RHINITS   Chronic shoulder pain    left   Constipation 2019   Diverticulosis    ED (erectile dysfunction)    Former smoker    quit late 2018   H/O echocardiogram 03/20/08   mild - moderate LVH, EF 50-55%, mild RV enlargement, mild mitral regurgitation, Dr. 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 Reyes Ivan   History of neck surgery    Hyperlipidemia    Hypertension    Impaired fasting glucose     Family History  Problem Relation Age of Onset   Cancer Mother  LUNG   Heart disease Father 79       MI   Cancer Paternal Aunt        type unknown   Cancer Paternal Uncle        type unknown   Hypertension Brother    Diabetes Neg Hx    Stroke Neg Hx    Colon cancer Neg Hx    Esophageal cancer Neg Hx    Pancreatic cancer Neg Hx    Stomach cancer Neg Hx    Rectal cancer Neg Hx     Past Surgical History:  Procedure Laterality Date   COLONOSCOPY  10/2010   diverticulosis, othewrise normal, repeat 10 years, Dr. Marina Goodell   SPINE SURGERY  2011(JENKINS,MD)   CERVICAL DISC AND FUSION   TOTAL KNEE ARTHROPLASTY Left 01/23/2023   Procedure: LEFT TOTAL KNEE ARTHROPLASTY;  Surgeon: Tarry Kos, MD;  Location: [facility] OR;  Service:  Orthopedics;  Laterality: Left;   Social History   Occupational History   Occupation: Therapist, nutritional: [employer]  Tobacco Use   Smoking status: Some Days    Current packs/day: 0.00    Average packs/day: 0.6 packs/day for 41.0 years (25.8 ttl pk-yrs)    Types: Cigarettes    Start date: 05/15/1976    Last attempt to quit: 05/15/2017    Years since quitting: 5.8   Smokeless tobacco: Never   Tobacco comments:    Some day smoker , smokes about 2 cigarettes a month socially  Vaping Use   Vaping status: Never Used  Substance and Sexual Activity   Alcohol use: Yes    Alcohol/week: 10.0 standard drinks of alcohol    Types: 10 Cans of beer per week    Comment: 10 drinks per week, sometimes more   Drug use: Not Currently    Comment: 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

## 2023-03-09 ENCOUNTER — Ambulatory Visit: Payer: BC Managed Care – PPO | Admitting: Rehabilitative and Restorative Service Providers"

## 2023-03-09 ENCOUNTER — Encounter: Payer: Self-pay | Admitting: Rehabilitative and Restorative Service Providers"

## 2023-03-09 DIAGNOSIS — G8929 Other chronic pain: Secondary | ICD-10-CM

## 2023-03-09 DIAGNOSIS — R262 Difficulty in walking, not elsewhere classified: Secondary | ICD-10-CM

## 2023-03-09 DIAGNOSIS — M25662 Stiffness of left knee, not elsewhere classified: Secondary | ICD-10-CM | POA: Diagnosis not present

## 2023-03-09 DIAGNOSIS — R600 Localized edema: Secondary | ICD-10-CM

## 2023-03-09 DIAGNOSIS — M25562 Pain in left knee: Secondary | ICD-10-CM | POA: Diagnosis not present

## 2023-03-09 DIAGNOSIS — M6281 Muscle weakness (generalized): Secondary | ICD-10-CM | POA: Diagnosis not present

## 2023-03-09 NOTE — Therapy (Signed)
OUTPATIENT PHYSICAL THERAPY TREATMENT   Patient Name: James Bird MRN: [id number] DOB:03-10-1961, 62 y.o., male Today's Date: 03/09/2023  END OF SESSION:  PT End of Session - 03/09/23 1148     Visit Number 8    Number of Visits 20    Date for PT Re-Evaluation 04/20/23    Authorization Type [employer] state $52 copay    PT Start Time 1145    PT Stop Time 1230    PT Time Calculation (min) 45 min    Activity Tolerance Patient tolerated treatment well    Behavior During Therapy [facility] for tasks assessed/performed                   Past Medical History:  Diagnosis Date   Allergy    RHINITS   Chronic shoulder pain    left   Constipation 2019   Diverticulosis    ED (erectile dysfunction)    Former smoker    quit late 2018   H/O echocardiogram 03/20/08   mild - moderate LVH, EF 50-55%, mild RV enlargement, mild mitral regurgitation, Dr. 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 Reyes Ivan   History of neck surgery    Hyperlipidemia    Hypertension    Impaired fasting glucose    Past Surgical History:  Procedure Laterality Date   COLONOSCOPY  10/2010   diverticulosis, othewrise normal, repeat 10 years, Dr. Marina Goodell   SPINE SURGERY  2011(JENKINS,MD)   CERVICAL DISC AND FUSION   TOTAL KNEE ARTHROPLASTY Left 01/23/2023   Procedure: LEFT TOTAL KNEE ARTHROPLASTY;  Surgeon: Tarry Kos, MD;  Location: [facility] OR;  Service: Orthopedics;  Laterality: Left;   Patient Active Problem List   Diagnosis Date Noted   Status post total left knee replacement 01/23/2023   History of smoking 10-25 pack years 08/24/2021   History of colonic polyps 08/24/2021   H/O cervical discectomy 08/24/2021   Constipation 04/16/2020   Family history of heart disease 02/21/2019   Paresthesia 05/24/2018   Elevated PSA 05/24/2018   Nail deformity 05/24/2018   Onychomycosis 05/24/2018   Tinea pedis  05/24/2018   Lumbar radiculopathy 04/25/2018   Primary osteoarthritis of left knee 03/29/2017   Clonus 03/29/2017   Right foot drop 03/29/2017   Routine general medical examination at a health care facility 12/14/2015   Obesity 12/11/2014   Hyperlipidemia 11/28/2012   Erectile dysfunction 11/28/2012   Essential hypertension, benign 11/02/2011   Impaired fasting blood sugar 11/02/2011    PCP: Ronnald Nian MD  REFERRING PROVIDER: Cristie Hem, PA-C  REFERRING DIAG: [phone] (ICD-10-CM) - Status post total left knee replacement  THERAPY DIAG:  Chronic pain of left knee  Muscle weakness (generalized)  Stiffness of left knee, not elsewhere classified  Difficulty in walking, not elsewhere classified  Localized edema  Rationale for Evaluation and Treatment: Rehabilitation  ONSET DATE: Surgery 01/23/2023  SUBJECTIVE:   SUBJECTIVE STATEMENT: Pt indicated he felt like he worked out well last time.  Reported symptoms at 3/10 upon arrival.   PERTINENT HISTORY: PMH includes diverticulosis, ED, herpes simplex, HLD, HTN.  PAIN:  NPRS scale: 3/10 Pain location: Lt knee  Pain description: stiffness, achy Aggravating factors: static positioning, WB activity, end ranges Relieving factors: moving around, ice, stretching, medicine  PRECAUTIONS: None  WEIGHT BEARING RESTRICTIONS: No  FALLS:  Has patient fallen in last 6  months? No  LIVING ENVIRONMENT: Lives with: lives alone Lives in: House/apartment Stairs: has stairs for laundry.  3 stairs with rails Has following equipment at home: FWW,   OCCUPATION: Database admin.  Computer work.    PLOF: Independent, racquet ball  PATIENT GOALS: Reduce pain  OBJECTIVE:   PATIENT SURVEYS:  03/09/2023:  FOTO update:  48  02/09/2023 FOTO intake:  36  predicted:  59  COGNITION: 02/09/2023 Overall cognitive status: WFL    SENSATION: 02/09/2023 No specific testing today  EDEMA:  02/09/2023 Localized edema noted Lt  knee/Lt lower leg  MUSCLE LENGTH: 02/09/2023 No specific testing today  POSTURE:  02/09/2023 Difficulty in sitting, lying down tolerance to static postures.    PALPATION: 02/09/2023 Incision  LOWER EXTREMITY ROM:   ROM Left 02/09/2023 Left 02/15/2023 Left 02/23/2023 Left 02/28/23 Left 03/02/2023 Left 03/07/23  Hip flexion        Hip extension        Hip abduction        Hip adduction        Hip internal rotation        Hip external rotation        Knee flexion 86 AROM in supine heel slide 106 AROM in supine heel slide  A: 110 supine AROM heel slide supine  120 A: 115  Knee extension -28 AROM in seated LAQ -7 In supine heel prop -11 AROM in seated LAQ Extension lag in SLR seated -12 A: -10 P:  Seated AROM LAQ -7 A: -5 P: 0  Ankle dorsiflexion        Ankle plantarflexion        Ankle inversion        Ankle eversion         (Blank rows = not tested)  LOWER EXTREMITY MMT:  MMT Right 02/09/23 Left 02/09/23 Right 03/02/2023 Left 03/02/2023  Hip flexion 5/5 4/5  5/5  Hip extension      Hip abduction      Hip adduction      Hip internal rotation      Hip external rotation      Knee flexion 5/5 4/5  5/5  Knee extension 5/5 2+/5 5/5 66.9, 66.9 lbs 5/5 42, 47 lbs   Ankle dorsiflexion 5/5 5/5    Ankle plantarflexion      Ankle inversion      Ankle eversion       (Blank rows = not tested)  LOWER EXTREMITY SPECIAL TESTS:  02/09/2023 No specific testing   FUNCTIONAL TESTS:  02/09/2023 18 inch chair transfer: unable s UE assist  GAIT: 02/23/2023:  SPC use in clinic c maintained knee flexion in stance, reduced stance.   02/09/2023 Modified independent ambulation c FWW with reduced stance on Lt leg, reduce toe off progression, maintained knee flexion in stance, lacking full TKE  TODAY'S  TREATMENT                                                                           DATE: 03/09/2023 Therapeutic Exercise: Sci[employer] bike LEs only seat 11 lvl 3.0 8 mins Incilne gastroc stretch 1 min x 3 bilaterally  Leg Press: bil 125# x 20, Left LE only: 75 lbs 2 x 15  Lateral step down 4 inch step WB on Lt x 10 with hand assist on rail  Supine Lt leg AROM heel slide 5 sec hold combo with SLR x 10  TKE stretch to tolerance at start of vaso : 8 mins   Neuro Re-ed SLS c contralateral leg reaching light touch forward, side and back x 8 bilateral with occasional to moderate HHA on bar when Lt SLS  Vasopneumatic   Left knee medium compression 34* 10 minutes with elevation.  Heel prop trial start of treatment.   TODAY'S  TREATMENT                                                                          DATE: 03/07/2023 Therapeutic Exercise: Sci[employer] bike LEs only seat 11 lvl 3.0 6 mins Leg Press: bil 125# x 20, Left LE only: 75# x 30 Seated Lt leg LAQ x 15 c 5# Sit to stand: x 10 from low mat table, x 10 with Rt foot in in more forward postion.  Neuro Re-ed Vectors: sliding disc toward 2 cones placed ant/lat, post/lat x 15 c single UE support Vasopneumatic   Left knee medium compression 34* 10 minutes with elevation.  Heel prop trial start of treatment.    TODAY'S  TREATMENT                                                                          DATE: 03/02/2023 Therapeutic Exercise: Sci[employer] bike LEs only seat 12 lvl 3.0 8 mins Incline gastroc stretch in runner stretch Lt leg posterior c quad set hold 5 x 30 secs  Step up forward on Lt leg with single on hand assist 6 inch step 2 x 10  Seated Lt leg LAQ x 15 with pause in end ranges  Manual Therapy Prone TKE stretch c percussive device to Lt hamstring for extension mobility gains.    Neuro Re-ed SLS with contralateral leg clearing over and back 9 inch hurdle x 15 bilateral c occasional HHA  Vasopneumatic   Left knee medium compression 34* 10 minutes with  elevation.  Heel prop trial start of treatment.   TODAY'S  TREATMENT  DATE: 02/28/2023 Therapeutic Exercise: Sci[employer] bike LEs only seat 8 level 3 for 8 min Gastro stretch on slant board x 3 holding 30 sec Step ups 4 inch step x 15 leading c Left LE Step ups on 6 inch step x 10 leading with Left LE Seated Lt quad set c SLR 3 x 5  Leg press double leg 75 lbs 2 x15, Lt leg only 37 lbs 2 x 10 Seated LAQ: 2 x 10 2 #   Vasopneumatic   Left knee medium compression 34*  x 15 minutes with elevation.  Heel prop trial start of treatment.     TODAY'S  TREATMENT                                                                          DATE: 02/23/2023 Therapeutic Exercise: SciFit bike LEs only seat 11 level 3 for 8 min Seated Lt quad set 5 sec hold x 5 Seated Lt quad set c SLR 3 x 5  Leg press double leg 75 lbs x15, Lt leg only 25 lbs x 15 Incline gastroc stretch 30 sec x 3 bilateral Supine heel slide/quad set combo Lt leg 5 sec hold each x 10 Supine LAQ in 90 deg hip flexion Lt leg x 15  Vasopneumatic   Left knee medium compression 34* 10 minutes with elevation.  Heel prop trial start of treatment.     PATIENT EDUCATION:  02/09/2023 Education details: HEP, POC Person educated: Patient Education method: Programmer, multimedia, Demonstration, Verbal cues, and Handouts Education comprehension: verbalized understanding, returned demonstration, and verbal cues required  HOME EXERCISE PROGRAM: Access Code: [email or web address] URL: [email or web address] Date: 02/13/2023 Prepared by: Vladimir Faster  Exercises - Supine Heel Slide (Mirrored)  - 3-5 x daily - 7 x weekly - 2-3 sets - 10 reps - 5 hold - Supine Knee Extension Mobilization with Weight (Mirrored)  - 4-5 x daily - 7 x weekly - 2-3 sets - 1 reps - to tolerance up to 15 mins hold - Supine Quadricep Sets  - 3-5 x daily - 7 x weekly - 2-3 sets - 10 reps - 5 hold - Seated Quad  Set (Mirrored)  - 3-5 x daily - 7 x weekly - 2-3 sets - 10 reps - 5 hold - Seated Knee Flexion Extension AROM   - 3-5 x daily - 7 x weekly - 2-3 sets - 10 reps - 5 seconds hold - Seated Long Arc Quad (Mirrored)  - 3-5 x daily - 7 x weekly - 2-3 sets - 5-10 reps - 2 hold - Seated Passive Knee Extension  - 3-5 x daily - 7 x weekly - 2-3 sets - 1 reps - 1-2 minutes hold  ASSESSMENT:  CLINICAL IMPRESSION: Difficulty with WB activity and control was noted with Lt leg.  Continued strengthening indicated as well as focus on gaining end range movement.   OBJECTIVE IMPAIRMENTS: Abnormal gait, decreased balance, decreased coordination, decreased endurance, decreased mobility, difficulty walking, decreased ROM, decreased strength, hypomobility, increased edema, increased fascial restrictions, impaired perceived functional ability, impaired flexibility, improper body mechanics, and pain.   ACTIVITY LIMITATIONS: carrying, lifting, bending, sitting, standing, squatting, sleeping, stairs, transfers, bed mobility, dressing, and locomotion level  PARTICIPATION LIMITATIONS: cleaning, laundry, interpersonal relationship,  driving, shopping, and community activity  PERSONAL FACTORS:  PMH includes diverticulosis, ED, herpes simplex, HLD, HTN.  are also affecting patient's functional outcome.   REHAB POTENTIAL: Good  CLINICAL DECISION MAKING: Stable/uncomplicated  EVALUATION COMPLEXITY: Low   GOALS: Goals reviewed with patient? Yes  SHORT TERM GOALS: (target date for Short term goals are 3 weeks 03/02/2023)   1.  Patient will demonstrate independent use of home exercise program to maintain progress from in clinic treatments.  Goal status: MET 02/28/23  LONG TERM GOALS: (target dates for all long term goals are 10 weeks  04/20/2023 )   1. Patient will demonstrate/report pain at worst less than or equal to 2/10 to facilitate minimal limitation in daily activity secondary to pain symptoms.  Goal status: on  going 03/02/2023   2. Patient will demonstrate independent use of home exercise program to facilitate ability to maintain/progress functional gains from skilled physical therapy services.  Goal status: on going 03/02/2023   3. Patient will demonstrate FOTO outcome > or = 59 % to indicate reduced disability due to condition.  Goal status: on going 03/02/2023   4.  Patient will demonstrate Lt LE MMT 5/5 throughout to faciltiate usual transfers, stairs, squatting at [facility] for daily life.   Goal status: Met 03/02/2023 by MMT.  Dynamometry not equal for extension knee.    5.  Patient will demonstrate independent ambulation community distances > 300 ft for usual mobility.  Goal status: on going 03/02/2023   6.  Patient will demonstrate ascending/descending stairs reciprocally s UE assist for community integration.   Goal status: on going 03/02/2023   7.  Patient will demonstrate Lt knee AROM 0-110 deg to facilitate transfers, ambulation and other daily activity at [facility].  Goal Status: on going 03/02/2023   PLAN:  PT FREQUENCY: 1-3x/week  PT DURATION: 10 weeks  PLANNED INTERVENTIONS: Therapeutic exercises, Therapeutic activity, Neuro Muscular re-education, Balance training, Gait training, Patient/Family education, Joint mobilization, Stair training, DME instructions, Dry Needling, Electrical stimulation, Traction, Cryotherapy, vasopneumatic deviceMoist heat, Taping, Ultrasound, Ionotophoresis 4mg /ml Dexamethasone, and aquatic therapy, Manual therapy.  All included unless contraindicated  PLAN FOR NEXT SESSION:   Extension mobility gains, WB strengthening/balance improvements.    Chyrel Masson, PT, DPT, OCS, ATC 03/09/23  12:32 PM

## 2023-03-10 NOTE — Telephone Encounter (Signed)
Results were sent to patient's MyChart as there were no concerning nodules or other new findings. Called patient to inform him of the results. Patient states he did see the results on MyChart but wanted to speak with someone to be sure the results were normal. Advised patient of the small stable nodule that was noted and his follow up would be due in 12 months. Patient's results were sent to patient's PCP as per protocol to review results and advised patient as needed based off other findings. Patient verbalized understanding and denied any further questions or concerns at this time.

## 2023-03-12 ENCOUNTER — Encounter: Payer: Self-pay | Admitting: Orthopaedic Surgery

## 2023-03-14 ENCOUNTER — Ambulatory Visit: Payer: BC Managed Care – PPO | Admitting: Physical Therapy

## 2023-03-14 ENCOUNTER — Encounter: Payer: Self-pay | Admitting: Physical Therapy

## 2023-03-14 DIAGNOSIS — M25662 Stiffness of left knee, not elsewhere classified: Secondary | ICD-10-CM | POA: Diagnosis not present

## 2023-03-14 DIAGNOSIS — R262 Difficulty in walking, not elsewhere classified: Secondary | ICD-10-CM | POA: Diagnosis not present

## 2023-03-14 DIAGNOSIS — M6281 Muscle weakness (generalized): Secondary | ICD-10-CM

## 2023-03-14 DIAGNOSIS — M25562 Pain in left knee: Secondary | ICD-10-CM

## 2023-03-14 DIAGNOSIS — G8929 Other chronic pain: Secondary | ICD-10-CM

## 2023-03-14 DIAGNOSIS — R600 Localized edema: Secondary | ICD-10-CM

## 2023-03-14 NOTE — Therapy (Signed)
OUTPATIENT PHYSICAL THERAPY TREATMENT   Patient Name: James Bird MRN: [id number] DOB:1960-11-26, 62 y.o., male Today's Date: 03/14/2023  END OF SESSION:  PT End of Session - 03/14/23 1149     Visit Number 9    Number of Visits 20    Date for PT Re-Evaluation 04/20/23    Authorization Type [employer] state $52 copay    Progress Note Due on Visit 10    PT Start Time 1147    PT Stop Time 1230    PT Time Calculation (min) 43 min    Activity Tolerance Patient tolerated treatment well    Behavior During Therapy [facility] for tasks assessed/performed                   Past Medical History:  Diagnosis Date   Allergy    RHINITS   Chronic shoulder pain    left   Constipation 2019   Diverticulosis    ED (erectile dysfunction)    Former smoker    quit late 2018   H/O echocardiogram 03/20/08   mild - moderate LVH, EF 50-55%, mild RV enlargement, mild mitral regurgitation, Dr. 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 Reyes Ivan   History of neck surgery    Hyperlipidemia    Hypertension    Impaired fasting glucose    Past Surgical History:  Procedure Laterality Date   COLONOSCOPY  10/2010   diverticulosis, othewrise normal, repeat 10 years, Dr. Marina Goodell   SPINE SURGERY  2011(JENKINS,MD)   CERVICAL DISC AND FUSION   TOTAL KNEE ARTHROPLASTY Left 01/23/2023   Procedure: LEFT TOTAL KNEE ARTHROPLASTY;  Surgeon: Tarry Kos, MD;  Location: [facility] OR;  Service: Orthopedics;  Laterality: Left;   Patient Active Problem List   Diagnosis Date Noted   Status post total left knee replacement 01/23/2023   History of smoking 10-25 pack years 08/24/2021   History of colonic polyps 08/24/2021   H/O cervical discectomy 08/24/2021   Constipation 04/16/2020   Family history of heart disease 02/21/2019   Paresthesia 05/24/2018   Elevated PSA 05/24/2018   Nail deformity 05/24/2018    Onychomycosis 05/24/2018   Tinea pedis 05/24/2018   Lumbar radiculopathy 04/25/2018   Primary osteoarthritis of left knee 03/29/2017   Clonus 03/29/2017   Right foot drop 03/29/2017   Routine general medical examination at a health care facility 12/14/2015   Obesity 12/11/2014   Hyperlipidemia 11/28/2012   Erectile dysfunction 11/28/2012   Essential hypertension, benign 11/02/2011   Impaired fasting blood sugar 11/02/2011    PCP: Ronnald Nian MD  REFERRING PROVIDER: Cristie Hem, PA-C  REFERRING DIAG: [phone] (ICD-10-CM) - Status post total left knee replacement  THERAPY DIAG:  Chronic pain of left knee  Muscle weakness (generalized)  Stiffness of left knee, not elsewhere classified  Difficulty in walking, not elsewhere classified  Localized edema  Rationale for Evaluation and Treatment: Rehabilitation  ONSET DATE: Surgery 01/23/2023  SUBJECTIVE:   SUBJECTIVE STATEMENT: Pt reporting 3/10 pain upon arrival. Pt concerned with "clicking" in his left knee.   PERTINENT HISTORY: PMH includes diverticulosis, ED, herpes simplex, HLD, HTN.  PAIN:  NPRS scale: 3/10 Pain location: Lt knee  Pain description: stiffness, achy Aggravating factors: static positioning, WB activity, end ranges Relieving factors: moving around, ice, stretching, medicine  PRECAUTIONS: None  WEIGHT BEARING RESTRICTIONS: No  FALLS:  Has  patient fallen in last 6 months? No  LIVING ENVIRONMENT: Lives with: lives alone Lives in: House/apartment Stairs: has stairs for laundry.  3 stairs with rails Has following equipment at home: FWW,   OCCUPATION: Database admin.  Computer work.    PLOF: Independent, racquet ball  PATIENT GOALS: Reduce pain  OBJECTIVE:   PATIENT SURVEYS:  03/09/2023:  FOTO update:  48  02/09/2023 FOTO intake:  36  predicted:  59  COGNITION: 02/09/2023 Overall cognitive status: WFL    SENSATION: 02/09/2023 No specific testing today  EDEMA:   02/09/2023 Localized edema noted Lt knee/Lt lower leg  MUSCLE LENGTH: 02/09/2023 No specific testing today  POSTURE:  02/09/2023 Difficulty in sitting, lying down tolerance to static postures.    PALPATION: 02/09/2023 Incision  LOWER EXTREMITY ROM:   ROM Left 02/09/2023 Left 02/15/2023 Left 02/23/2023 Left 02/28/23 Left 03/02/2023 Left 03/07/23  Hip flexion        Hip extension        Hip abduction        Hip adduction        Hip internal rotation        Hip external rotation        Knee flexion 86 AROM in supine heel slide 106 AROM in supine heel slide  A: 110 supine AROM heel slide supine  120 A: 115  Knee extension -28 AROM in seated LAQ -7 In supine heel prop -11 AROM in seated LAQ Extension lag in SLR seated -12 A: -10 P:  Seated AROM LAQ -7 A: -5 P: 0  Ankle dorsiflexion        Ankle plantarflexion        Ankle inversion        Ankle eversion         (Blank rows = not tested)  LOWER EXTREMITY MMT:  MMT Right 02/09/23 Left 02/09/23 Right 03/02/2023 Left 03/02/2023  Hip flexion 5/5 4/5  5/5  Hip extension      Hip abduction      Hip adduction      Hip internal rotation      Hip external rotation      Knee flexion 5/5 4/5  5/5  Knee extension 5/5 2+/5 5/5 66.9, 66.9 lbs 5/5 42, 47 lbs   Ankle dorsiflexion 5/5 5/5    Ankle plantarflexion      Ankle inversion      Ankle eversion       (Blank rows = not tested)  LOWER EXTREMITY SPECIAL TESTS:  02/09/2023 No specific testing   FUNCTIONAL TESTS:  02/09/2023 18 inch chair transfer: unable s UE assist  GAIT: 02/23/2023:  SPC use in clinic c maintained knee flexion in stance, reduced stance.   02/09/2023 Modified independent ambulation c FWW with reduced stance on Lt leg, reduce toe off progression, maintained knee flexion in stance, lacking full TKE  TODAY'S  TREATMENT                                                                          DATE: 03/14/2023 Therapeutic Exercise: [employer] bike LEs only seat 10 lvl 3.0  x 8 mins Incilne gastroc stretch 1 min x 3 bilaterally  Leg Press: bil 125# x 20, Left LE only: 75 lbs 2 x 15  Step up on 6 inch step x 10 leading c left LE Step up on 8 inch step x 15 leading c left LE Lateral step up on 8 inch step x 15   Functional Activities:  Up and down 1 flight stairs with single hand rail with reciprocal gait  Vasopneumatic   Left knee medium compression 34* 10 minutes with elevation.  Heel prop trial start of treatment.     TODAY'S  TREATMENT                                                                          DATE: 03/09/2023 Therapeutic Exercise: [employer] bike LEs only seat 11 lvl 3.0 8 mins Incilne gastroc stretch 1 min x 3 bilaterally  Leg Press: bil 125# x 20, Left LE only: 75 lbs 2 x 15  Lateral step down 4 inch step WB on Lt x 10 with hand assist on rail  Supine Lt leg AROM heel slide 5 sec hold combo with SLR x 10  TKE stretch to tolerance at start of vaso : 8 mins   Neuro Re-ed SLS c contralateral leg reaching light touch forward, side and back x 8 bilateral with occasional to moderate HHA on bar when Lt SLS  Vasopneumatic   Left knee medium compression 34* 10 minutes with elevation.  Heel prop trial start of treatment.   TODAY'S  TREATMENT                                                                          DATE: 03/07/2023 Therapeutic Exercise: Sci[employer] bike LEs only seat 11 lvl 3.0 6 mins Leg Press: bil 125# x 20, Left LE only: 75# x 30 Seated Lt leg LAQ x 15 c 5# Sit to stand: x 10 from low mat table, x 10 with Rt foot in in more forward postion.  Neuro Re-ed Vectors: sliding disc toward 2 cones placed ant/lat, post/lat x 15 c single UE support Vasopneumatic   Left knee medium compression 34* 10 minutes with elevation.  Heel prop trial start of treatment.     TODAY'S  TREATMENT  DATE: 03/02/2023 Therapeutic Exercise: Sci[employer] bike LEs only seat 12 lvl 3.0 8 mins Incline gastroc stretch in runner stretch Lt leg posterior c quad set hold 5 x 30 secs  Step up forward on Lt leg with single on hand assist 6 inch step 2 x 10  Seated Lt leg LAQ x 15 with pause in end ranges  Manual Therapy Prone TKE stretch c percussive device to Lt hamstring for extension mobility gains.    Neuro Re-ed SLS with contralateral leg clearing over and back 9 inch hurdle x 15 bilateral c occasional HHA  Vasopneumatic   Left knee medium compression 34* 10 minutes with elevation.  Heel prop trial start of treatment.        PATIENT EDUCATION:  02/09/2023 Education details: HEP, POC Person educated: Patient Education method: Programmer, multimedia, Demonstration, Verbal cues, and Handouts Education comprehension: verbalized understanding, returned demonstration, and verbal cues required  HOME EXERCISE PROGRAM: Access Code: [email or web address] URL: [email or web address] Date: 02/13/2023 Prepared by: Vladimir Faster  Exercises - Supine Heel Slide (Mirrored)  - 3-5 x daily - 7 x weekly - 2-3 sets - 10 reps - 5 hold - Supine Knee Extension Mobilization with Weight (Mirrored)  - 4-5 x daily - 7 x weekly - 2-3 sets - 1 reps - to tolerance up to 15 mins hold - Supine Quadricep Sets  - 3-5 x daily - 7 x weekly - 2-3 sets - 10 reps - 5 hold - Seated Quad Set (Mirrored)  - 3-5 x daily - 7 x weekly - 2-3 sets - 10 reps - 5 hold - Seated Knee Flexion Extension AROM   - 3-5 x daily - 7 x weekly - 2-3 sets - 10 reps - 5 seconds hold - Seated Long Arc Quad (Mirrored)  - 3-5 x daily - 7 x weekly - 2-3 sets - 5-10 reps - 2 hold - Seated Passive Knee Extension  - 3-5 x daily - 7 x weekly - 2-3 sets - 1 reps - 1-2 minutes hold  ASSESSMENT:  CLINICAL IMPRESSION: Treatment today focusing on left LE strengthening in  more weight bearing activities to progress functional mobility in community setting. Pt able to navigate 1 flight of stairs with single hand rail support with reciprocal gait pattern. Continue skilled PT interventions.   OBJECTIVE IMPAIRMENTS: Abnormal gait, decreased balance, decreased coordination, decreased endurance, decreased mobility, difficulty walking, decreased ROM, decreased strength, hypomobility, increased edema, increased fascial restrictions, impaired perceived functional ability, impaired flexibility, improper body mechanics, and pain.   ACTIVITY LIMITATIONS: carrying, lifting, bending, sitting, standing, squatting, sleeping, stairs, transfers, bed mobility, dressing, and locomotion level  PARTICIPATION LIMITATIONS: cleaning, laundry, interpersonal relationship, driving, shopping, and community activity  PERSONAL FACTORS:  PMH includes diverticulosis, ED, herpes simplex, HLD, HTN.  are also affecting patient's functional outcome.   REHAB POTENTIAL: Good  CLINICAL DECISION MAKING: Stable/uncomplicated  EVALUATION COMPLEXITY: Low   GOALS: Goals reviewed with patient? Yes  SHORT TERM GOALS: (target date for Short term goals are 3 weeks 03/02/2023)   1.  Patient will demonstrate independent use of home exercise program to maintain progress from in clinic treatments.  Goal status: MET 02/28/23  LONG TERM GOALS: (target dates for all long term goals are 10 weeks  04/20/2023 )   1. Patient will demonstrate/report pain at worst less than or equal to 2/10 to facilitate minimal limitation in daily activity secondary to pain symptoms.  Goal status: on going 03/02/2023   2. Patient will demonstrate independent use of home exercise program  to facilitate ability to maintain/progress functional gains from skilled physical therapy services.  Goal status: on going 03/02/2023   3. Patient will demonstrate FOTO outcome > or = 59 % to indicate reduced disability due to condition.  Goal  status: on going 03/02/2023   4.  Patient will demonstrate Lt LE MMT 5/5 throughout to faciltiate usual transfers, stairs, squatting at [facility] for daily life.   Goal status: Met 03/02/2023 by MMT.  Dynamometry not equal for extension knee.    5.  Patient will demonstrate independent ambulation community distances > 300 ft for usual mobility.  Goal status: on going 03/02/2023   6.  Patient will demonstrate ascending/descending stairs reciprocally s UE assist for community integration.   Goal status: on going 03/02/2023   7.  Patient will demonstrate Lt knee AROM 0-110 deg to facilitate transfers, ambulation and other daily activity at [facility].  Goal Status: on going 03/02/2023   PLAN:  PT FREQUENCY: 1-3x/week  PT DURATION: 10 weeks  PLANNED INTERVENTIONS: Therapeutic exercises, Therapeutic activity, Neuro Muscular re-education, Balance training, Gait training, Patient/Family education, Joint mobilization, Stair training, DME instructions, Dry Needling, Electrical stimulation, Traction, Cryotherapy, vasopneumatic deviceMoist heat, Taping, Ultrasound, Ionotophoresis 4mg /ml Dexamethasone, and aquatic therapy, Manual therapy.  All included unless contraindicated  PLAN FOR NEXT SESSION:   Extension mobility gains, WB strengthening/balance improvements.    Narda Amber, PT, MPT 03/14/23 1:06 PM   03/14/23  1:06 PM

## 2023-03-16 ENCOUNTER — Ambulatory Visit: Payer: BC Managed Care – PPO | Admitting: Rehabilitative and Restorative Service Providers"

## 2023-03-16 ENCOUNTER — Encounter: Payer: Self-pay | Admitting: Rehabilitative and Restorative Service Providers"

## 2023-03-16 DIAGNOSIS — M25662 Stiffness of left knee, not elsewhere classified: Secondary | ICD-10-CM

## 2023-03-16 DIAGNOSIS — R262 Difficulty in walking, not elsewhere classified: Secondary | ICD-10-CM

## 2023-03-16 DIAGNOSIS — M6281 Muscle weakness (generalized): Secondary | ICD-10-CM

## 2023-03-16 DIAGNOSIS — G8929 Other chronic pain: Secondary | ICD-10-CM

## 2023-03-16 DIAGNOSIS — M25562 Pain in left knee: Secondary | ICD-10-CM | POA: Diagnosis not present

## 2023-03-16 DIAGNOSIS — R600 Localized edema: Secondary | ICD-10-CM

## 2023-03-16 NOTE — Therapy (Addendum)
OUTPATIENT PHYSICAL THERAPY TREATMENT /PROGRESS NOTE  / DISCHARGE   Patient Name: James Bird MRN: [id number] DOB:[birth year], 62 y.o., male Today's Date: 03/16/2023   Progress Note Reporting Period 02/09/2023 to 03/16/2023  See note below for Objective Data and Assessment of Progress/Goals.      END OF SESSION:  PT End of Session - 03/16/23 1149     Visit Number 10    Number of Visits 20    Date for PT Re-Evaluation 04/20/23    Authorization Type [employer] state $52 copay    Progress Note Due on Visit 20    PT Start Time 1144    PT Stop Time 1229    PT Time Calculation (min) 45 min    Activity Tolerance Patient tolerated treatment well    Behavior During Therapy [facility] for tasks assessed/performed                    Past Medical History:  Diagnosis Date   Allergy    RHINITS   Chronic shoulder pain    left   Constipation 2019   Diverticulosis    ED (erectile dysfunction)    Former smoker    quit late 2018   H/O echocardiogram 03/20/08   mild - moderate LVH, EF 50-55%, mild RV enlargement, mild mitral regurgitation, Dr. 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 Reyes Ivan   History of neck surgery    Hyperlipidemia    Hypertension    Impaired fasting glucose    Past Surgical History:  Procedure Laterality Date   COLONOSCOPY  10/2010   diverticulosis, othewrise normal, repeat 10 years, Dr. Marina Goodell   SPINE SURGERY  2011(JENKINS,MD)   CERVICAL DISC AND FUSION   TOTAL KNEE ARTHROPLASTY Left 01/23/2023   Procedure: LEFT TOTAL KNEE ARTHROPLASTY;  Surgeon: Tarry Kos, MD;  Location: [facility] OR;  Service: Orthopedics;  Laterality: Left;   Patient Active Problem List   Diagnosis Date Noted   Status post total left knee replacement 01/23/2023   History of smoking 10-25 pack years 08/24/2021   History of colonic polyps 08/24/2021   H/O cervical discectomy 08/24/2021    Constipation 04/16/2020   Family history of heart disease 02/21/2019   Paresthesia 05/24/2018   Elevated PSA 05/24/2018   Nail deformity 05/24/2018   Onychomycosis 05/24/2018   Tinea pedis 05/24/2018   Lumbar radiculopathy 04/25/2018   Primary osteoarthritis of left knee 03/29/2017   Clonus 03/29/2017   Right foot drop 03/29/2017   Routine general medical examination at a health care facility 12/14/2015   Obesity 12/11/2014   Hyperlipidemia 11/28/2012   Erectile dysfunction 11/28/2012   Essential hypertension, benign 11/02/2011   Impaired fasting blood sugar 11/02/2011    PCP: Ronnald Nian MD  REFERRING PROVIDER: Cristie Hem, PA-C  REFERRING DIAG: [phone] (ICD-10-CM) - Status post total left knee replacement  THERAPY DIAG:  Chronic pain of left knee  Muscle weakness (generalized)  Stiffness of left knee, not elsewhere classified  Difficulty in walking, not elsewhere classified  Localized edema  Rationale for Evaluation and Treatment: Rehabilitation  ONSET DATE: Surgery 01/23/2023  SUBJECTIVE:   SUBJECTIVE STATEMENT: Pt indicated consistent 2-3/10.  Occasional sharp pain at times and reported the general tightness.  Can feel more in evening after more exercise.   Pt indicated overall improvement to normal at around 35-40 %.  Global  rating of change:  +4 moderately better.    PERTINENT HISTORY: PMH includes diverticulosis, ED, herpes simplex, HLD, HTN.  PAIN:  NPRS scale: 2-3/10 Pain location: Lt knee  Pain description: stiffness, achy Aggravating factors: static positioning, WB activity, end ranges Relieving factors: moving around, ice, stretching, medicine  PRECAUTIONS: None  WEIGHT BEARING RESTRICTIONS: No  FALLS:  Has patient fallen in last 6 months? No  LIVING ENVIRONMENT: Lives with: lives alone Lives in: House/apartment Stairs: has stairs for laundry.  3 stairs with rails Has following equipment at home: FWW,   OCCUPATION: Database admin.   Computer work.    PLOF: Independent, racquet ball  PATIENT GOALS: Reduce pain  OBJECTIVE:   PATIENT SURVEYS:  03/09/2023:  FOTO update:  48  02/09/2023 FOTO intake:  36  predicted:  59  COGNITION: 02/09/2023 Overall cognitive status: WFL    SENSATION: 02/09/2023 No specific testing today  EDEMA:  02/09/2023 Localized edema noted Lt knee/Lt lower leg  MUSCLE LENGTH: 02/09/2023 No specific testing today  POSTURE:  02/09/2023 Difficulty in sitting, lying down tolerance to static postures.    PALPATION: 02/09/2023 Incision  LOWER EXTREMITY ROM:   ROM Left 02/09/2023 Left 02/15/2023 Left 02/23/2023 Left 02/28/23 Left 03/02/2023 Left 03/07/23 Left 03/16/2023  Hip flexion         Hip extension         Hip abduction         Hip adduction         Hip internal rotation         Hip external rotation         Knee flexion 86 AROM in supine heel slide 106 AROM in supine heel slide  A: 110 supine AROM heel slide supine  120 A: 115   Knee extension -28 AROM in seated LAQ -7 In supine heel prop -11 AROM in seated LAQ Extension lag in SLR seated -12 A: -10 P:  Seated AROM LAQ -7 A: -5 P: 0 Seated AROM LAQ -6  Ankle dorsiflexion         Ankle plantarflexion         Ankle inversion         Ankle eversion          (Blank rows = not tested)  LOWER EXTREMITY MMT:  MMT Right 02/09/23 Left 02/09/23 Right 03/02/2023 Left 03/02/2023 Left 03/16/2023  Hip flexion 5/5 4/5  5/5   Hip extension       Hip abduction       Hip adduction       Hip internal rotation       Hip external rotation       Knee flexion 5/5 4/5  5/5   Knee extension 5/5 2+/5 5/5 66.9, 66.9 lbs 5/5 42, 47 lbs  5/5 68, 73 lbs  Ankle dorsiflexion 5/5 5/5     Ankle plantarflexion       Ankle inversion       Ankle eversion        (Blank rows = not tested)  LOWER EXTREMITY SPECIAL TESTS:  02/09/2023 No specific testing   FUNCTIONAL TESTS:  02/09/2023 18 inch chair transfer: unable s UE  assist  GAIT: 03/16/2023:  Spc into clinic, able to do household distances in clinic independent.  Deviation in Lt stance, knee flexion in stance.   02/23/2023:  SPC use in clinic c maintained knee flexion in stance, reduced stance.   02/09/2023 Modified independent ambulation c FWW with reduced stance on Lt  leg, reduce toe off progression, maintained knee flexion in stance, lacking full TKE                                                                                                                                                                         TODAY'S  TREATMENT                                                                          DATE: 03/16/2023 Therapeutic Exercise: SciFit bike LEs only seat 11 lvl 3.0  x 8 mins Incilne gastroc stretch 30 seconds x 3 bilaterally  Lateral 6 inch step down WB on Lt leg no UE assist 2 x 15 Heel prop 5 mins at start of vaso  Neuro Re-ed Tandem stance with reactive light touching blazepods on wall 4 lights 30 sec x 2 bilateral SLS with contralateral leg cone touching 3 cones anterior, anterior/medial, anterior/lateral x 8 each, performed bilateral with occasional HHA Lateral stepping 3 cones x 8 bilateral    Vasopneumatic   Left knee medium compression 34* 10 minutes with elevation.  Heel prop 5 mins to start  TODAY'S  TREATMENT                                                                          DATE: 03/14/2023 Therapeutic Exercise: Sci[employer] bike LEs only seat 10 lvl 3.0  x 8 mins Incilne gastroc stretch 1 min x 3 bilaterally  Leg Press: bil 125# x 20, Left LE only: 75 lbs 2 x 15  Step up on 6 inch step x 10 leading c left LE Step up on 8 inch step x 15 leading c left LE Lateral step up on 8 inch step x 15   Functional Activities:  Up and down 1 flight stairs with single hand rail with reciprocal gait  Vasopneumatic   Left knee medium compression 34* 10 minutes with elevation.  Heel prop trial start of treatment.     TODAY'S   TREATMENT  DATE: 03/09/2023 Therapeutic Exercise: SciFit bike LEs only seat 11 lvl 3.0 8 mins Incilne gastroc stretch 1 min x 3 bilaterally  Leg Press: bil 125# x 20, Left LE only: 75 lbs 2 x 15  Lateral step down 4 inch step WB on Lt x 10 with hand assist on rail  Supine Lt leg AROM heel slide 5 sec hold combo with SLR x 10  TKE stretch to tolerance at start of vaso : 8 mins   Neuro Re-ed SLS c contralateral leg reaching light touch forward, side and back x 8 bilateral with occasional to moderate HHA on bar when Lt SLS  Vasopneumatic   Left knee medium compression 34* 10 minutes with elevation.  Heel prop trial start of treatment.   TODAY'S  TREATMENT                                                                          DATE: 03/07/2023 Therapeutic Exercise: SciFit bike LEs only seat 11 lvl 3.0 6 mins Leg Press: bil 125# x 20, Left LE only: 75# x 30 Seated Lt leg LAQ x 15 c 5# Sit to stand: x 10 from low mat table, x 10 with Rt foot in in more forward postion.  Neuro Re-ed Vectors: sliding disc toward 2 cones placed ant/lat, post/lat x 15 c single UE support Vasopneumatic   Left knee medium compression 34* 10 minutes with elevation.  Heel prop trial start of treatment.    PATIENT EDUCATION:  02/09/2023 Education details: HEP, POC Person educated: Patient Education method: Programmer, multimedia, Demonstration, Verbal cues, and Handouts Education comprehension: verbalized understanding, returned demonstration, and verbal cues required  HOME EXERCISE PROGRAM: Access Code: [email or web address] URL: [email or web address] Date: 02/13/2023 Prepared by: Vladimir Faster  Exercises - Supine Heel Slide (Mirrored)  - 3-5 x daily - 7 x weekly - 2-3 sets - 10 reps - 5 hold - Supine Knee Extension Mobilization with Weight (Mirrored)  - 4-5 x daily - 7 x weekly - 2-3 sets - 1 reps - to tolerance up to 15 mins hold - Supine  Quadricep Sets  - 3-5 x daily - 7 x weekly - 2-3 sets - 10 reps - 5 hold - Seated Quad Set (Mirrored)  - 3-5 x daily - 7 x weekly - 2-3 sets - 10 reps - 5 hold - Seated Knee Flexion Extension AROM   - 3-5 x daily - 7 x weekly - 2-3 sets - 10 reps - 5 seconds hold - Seated Long Arc Quad (Mirrored)  - 3-5 x daily - 7 x weekly - 2-3 sets - 5-10 reps - 2 hold - Seated Passive Knee Extension  - 3-5 x daily - 7 x weekly - 2-3 sets - 1 reps - 1-2 minutes hold  ASSESSMENT:  CLINICAL IMPRESSION: The patient has attended 10 visits over the course of treatment cycle.  Patient has reported overall improvement at 35-40 % with global rating of change at +4 moderately better.  See objective data above for updated information regarding current presentation.   Pt has continued to show steady improvement in objective data measurements.  Despite improvements noted, Pt has continued deviation in gait with maintained knee flexion in stance and reduced stance phase on  Lt LE.  Primarily using SPC in ambulation at this time.  Pt may continue to benefit from skilled PT services in combination of strengthening and movement coordination in efforts to progress independence with gait and functional activity improvements.  Due to cost of visits, Pt was interested in reduced frequency of visits. Clinician suggested 1x/week plan and Pt was in agreement.    OBJECTIVE IMPAIRMENTS: Abnormal gait, decreased balance, decreased coordination, decreased endurance, decreased mobility, difficulty walking, decreased ROM, decreased strength, hypomobility, increased edema, increased fascial restrictions, impaired perceived functional ability, impaired flexibility, improper body mechanics, and pain.   ACTIVITY LIMITATIONS: carrying, lifting, bending, sitting, standing, squatting, sleeping, stairs, transfers, bed mobility, dressing, and locomotion level  PARTICIPATION LIMITATIONS: cleaning, laundry, interpersonal relationship, driving, shopping,  and community activity  PERSONAL FACTORS:  PMH includes diverticulosis, ED, herpes simplex, HLD, HTN.  are also affecting patient's functional outcome.   REHAB POTENTIAL: Good  CLINICAL DECISION MAKING: Stable/uncomplicated  EVALUATION COMPLEXITY: Low   GOALS: Goals reviewed with patient? Yes  SHORT TERM GOALS: (target date for Short term goals are 3 weeks 03/02/2023)   1.  Patient will demonstrate independent use of home exercise program to maintain progress from in clinic treatments.  Goal status: MET 02/28/23  LONG TERM GOALS: (target dates for all long term goals are 10 weeks  04/20/2023 )   1. Patient will demonstrate/report pain at worst less than or equal to 2/10 to facilitate minimal limitation in daily activity secondary to pain symptoms.  Goal status: on going 03/16/2023   2. Patient will demonstrate independent use of home exercise program to facilitate ability to maintain/progress functional gains from skilled physical therapy services.  Goal status: on going 03/16/2023   3. Patient will demonstrate FOTO outcome > or = 59 % to indicate reduced disability due to condition.  Goal status: on going 03/16/2023   4.  Patient will demonstrate Lt LE MMT 5/5 throughout to faciltiate usual transfers, stairs, squatting at [facility] for daily life.   Goal status: Met 03/02/2023 by MMT.     5.  Patient will demonstrate independent ambulation community distances > 300 ft for usual mobility.  Goal status: on going 03/16/2023   6.  Patient will demonstrate ascending/descending stairs reciprocally s UE assist for community integration.   Goal status: on going 03/16/2023   7.  Patient will demonstrate Lt knee AROM 0-110 deg to facilitate transfers, ambulation and other daily activity at PLOF.  Goal Status: on going 03/16/2023   PLAN:  PT FREQUENCY: 1-3x/week  PT DURATION: 10 weeks  PLANNED INTERVENTIONS: Therapeutic exercises, Therapeutic activity, Neuro Muscular re-education, Balance  training, Gait training, Patient/Family education, Joint mobilization, Stair training, DME instructions, Dry Needling, Electrical stimulation, Traction, Cryotherapy, vasopneumatic deviceMoist heat, Taping, Ultrasound, Ionotophoresis 4mg /ml Dexamethasone, and aquatic therapy, Manual therapy.  All included unless contraindicated  PLAN FOR NEXT SESSION:   1x/week.  Dynamic balance, compliant surface balance.    Chyrel Masson, PT, DPT, OCS, ATC 03/16/23  12:19 PM   PHYSICAL THERAPY DISCHARGE SUMMARY  Visits from Start of Care: 10  Current functional level related to goals / functional outcomes: See note   Remaining deficits: See note   Education / Equipment: HEP  Patient goals were partially met. Patient is being discharged due to not returning since the last visit.  Chyrel Masson, PT, DPT, OCS, ATC 04/26/23  3:26 PM

## 2023-03-27 ENCOUNTER — Encounter: Payer: Self-pay | Admitting: Orthopaedic Surgery

## 2023-03-27 NOTE — Telephone Encounter (Signed)
Spoke with patient. He was made aware that he can do dental procedures, but will need to take antibiotics for 2years post surgery. He will reach out to his dentist and see if they need anything else. I provided our fax number as well.

## 2023-03-29 ENCOUNTER — Ambulatory Visit (INDEPENDENT_AMBULATORY_CARE_PROVIDER_SITE_OTHER): Payer: BC Managed Care – PPO | Admitting: Physician Assistant

## 2023-03-29 ENCOUNTER — Encounter: Payer: Self-pay | Admitting: Physician Assistant

## 2023-03-29 DIAGNOSIS — Z96652 Presence of left artificial knee joint: Secondary | ICD-10-CM

## 2023-03-29 NOTE — Progress Notes (Signed)
Post-Op Visit Note   Patient: James Bird           Date of Birth: 04/24/1961           MRN: [id number] Visit Date: 03/29/2023 PCP: Ronnald Nian, MD   Assessment & Plan:  Chief Complaint:  Chief Complaint  Patient presents with   Left Knee - Follow-up    Left total knee arthroplasty 01/23/2023   Visit Diagnoses:  1. Status post total left knee replacement     Plan: Patient is a pleasant 62 year old gentleman who comes in today approximately 9 weeks status post left total knee replacement 01/23/2023.  He has been doing well in regards to pain and progression with physical therapy but is very concerned with a grinding feeling he is getting in the left knee.  This often occurs with ambulation.  The "grinding "does not cause pain.  There has been no new injury.  [facility] is working on quad and hamstring strengthening exercises.  Examination of left knee reveals a small effusion.  Range of motion from 0 to 120 degrees.  There is no joint line tenderness or tenderness to the proximal tibia.  He is stable to valgus and varus stress.  He is neurovascularly intact distally.  At this point, I am not concerned about his implant.  I have discussed following up with [place] in about 2 weeks for repeat evaluation and x-rays where we will continue to monitor the possible lucency to the tibial component.  He will call us with concerns or questions in the meantime.  Follow-Up Instructions: Return in about 2 weeks (around 04/12/2023).   Orders:  No orders of the defined types were placed in this encounter.  No orders of the defined types were placed in this encounter.   Imaging: No results found.  PMFS History: Patient Active Problem List   Diagnosis Date Noted   Status post total left knee replacement 01/23/2023   History of smoking 10-25 pack years 08/24/2021   History of colonic polyps 08/24/2021   H/O cervical discectomy 08/24/2021   Constipation  04/16/2020   Family history of heart disease 02/21/2019   Paresthesia 05/24/2018   Elevated PSA 05/24/2018   Nail deformity 05/24/2018   Onychomycosis 05/24/2018   Tinea pedis 05/24/2018   Lumbar radiculopathy 04/25/2018   Primary osteoarthritis of left knee 03/29/2017   Clonus 03/29/2017   Right foot drop 03/29/2017   Routine general medical examination at a health care facility 12/14/2015   Obesity 12/11/2014   Hyperlipidemia 11/28/2012   Erectile dysfunction 11/28/2012   Essential hypertension, benign 11/02/2011   Impaired fasting blood sugar 11/02/2011   Past Medical History:  Diagnosis Date   Allergy    RHINITS   Chronic shoulder pain    left   Constipation 2019   Diverticulosis    ED (erectile dysfunction)    Former smoker    quit late 2018   H/O echocardiogram 03/20/08   mild - moderate LVH, EF 50-55%, mild RV enlargement, mild mitral regurgitation, Dr. 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 Reyes Ivan   History of neck surgery    Hyperlipidemia    Hypertension    Impaired fasting glucose     Family History  Problem Relation Age of Onset   Cancer Mother  LUNG   Heart disease Father 73       MI   Cancer Paternal Aunt        type unknown   Cancer Paternal Uncle        type unknown   Hypertension Brother    Diabetes Neg Hx    Stroke Neg Hx    Colon cancer Neg Hx    Esophageal cancer Neg Hx    Pancreatic cancer Neg Hx    Stomach cancer Neg Hx    Rectal cancer Neg Hx     Past Surgical History:  Procedure Laterality Date   COLONOSCOPY  10/2010   diverticulosis, othewrise normal, repeat 10 years, Dr. Marina Goodell   SPINE SURGERY  2011(JENKINS,MD)   CERVICAL DISC AND FUSION   TOTAL KNEE ARTHROPLASTY Left 01/23/2023   Procedure: LEFT TOTAL KNEE ARTHROPLASTY;  Surgeon: Tarry Kos, MD;  Location: [facility] OR;  Service: Orthopedics;  Laterality: Left;   Social  History   Occupational History   Occupation: Therapist, nutritional: [employer]  Tobacco Use   Smoking status: Some Days    Current packs/day: 0.00    Average packs/day: 0.6 packs/day for 41.0 years (25.8 ttl pk-yrs)    Types: Cigarettes    Start date: 05/15/1976    Last attempt to quit: 05/15/2017    Years since quitting: 5.8   Smokeless tobacco: Never   Tobacco comments:    Some day smoker , smokes about 2 cigarettes a month socially  Vaping Use   Vaping status: Never Used  Substance and Sexual Activity   Alcohol use: Yes    Alcohol/week: 10.0 standard drinks of alcohol    Types: 10 Cans of beer per week    Comment: 10 drinks per week, sometimes more   Drug use: Not Currently    Comment: 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

## 2023-04-11 ENCOUNTER — Other Ambulatory Visit (INDEPENDENT_AMBULATORY_CARE_PROVIDER_SITE_OTHER): Payer: BC Managed Care – PPO

## 2023-04-11 ENCOUNTER — Encounter: Payer: Self-pay | Admitting: Orthopaedic Surgery

## 2023-04-11 ENCOUNTER — Ambulatory Visit (INDEPENDENT_AMBULATORY_CARE_PROVIDER_SITE_OTHER): Payer: BC Managed Care – PPO | Admitting: Orthopaedic Surgery

## 2023-04-11 DIAGNOSIS — Z96652 Presence of left artificial knee joint: Secondary | ICD-10-CM

## 2023-04-11 NOTE — Progress Notes (Signed)
Post-Op Visit Note   Patient: James Bird           Date of Birth: 1961-06-03           MRN: [id number] Visit Date: 04/11/2023 PCP: Ronnald Nian, MD   Assessment & Plan:  Chief Complaint:  Chief Complaint  Patient presents with   Left Knee - Follow-up    Left total knee arthroplasty 01/23/2023   Visit Diagnoses:  1. Status post total left knee replacement     Plan: Mr. Gaddis is 3 months status post left total knee replacement.  He is doing well overall.  He is getting used to the mechanical feeling of the knee replacement.  Examination left knee shows fully healed surgical scars.  Collaterals are stable.  Range of motion has progressed nicely.  Dental prophylaxis reinforced.  Increase activity as tolerated.  Recheck in 3 months with repeat x-rays.  Follow-Up Instructions: No follow-ups on file.   Orders:  Orders Placed This Encounter  Procedures   XR Knee 1-2 Views Left   No orders of the defined types were placed in this encounter.   Imaging: No results found.  PMFS History: Patient Active Problem List   Diagnosis Date Noted   Status post total left knee replacement 01/23/2023   History of smoking 10-25 pack years 08/24/2021   History of colonic polyps 08/24/2021   H/O cervical discectomy 08/24/2021   Constipation 04/16/2020   Family history of heart disease 02/21/2019   Paresthesia 05/24/2018   Elevated PSA 05/24/2018   Nail deformity 05/24/2018   Onychomycosis 05/24/2018   Tinea pedis 05/24/2018   Lumbar radiculopathy 04/25/2018   Primary osteoarthritis of left knee 03/29/2017   Clonus 03/29/2017   Right foot drop 03/29/2017   Routine general medical examination at a health care facility 12/14/2015   Obesity 12/11/2014   Hyperlipidemia 11/28/2012   Erectile dysfunction 11/28/2012   Essential hypertension, benign 11/02/2011   Impaired fasting blood sugar 11/02/2011   Past Medical History:  Diagnosis Date   Allergy    RHINITS   Chronic  shoulder pain    left   Constipation 2019   Diverticulosis    ED (erectile dysfunction)    Former smoker    quit late 2018   H/O echocardiogram 03/20/08   mild - moderate LVH, EF 50-55%, mild RV enlargement, mild mitral regurgitation, Dr. 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 Reyes Ivan   History of neck surgery    Hyperlipidemia    Hypertension    Impaired fasting glucose     Family History  Problem Relation Age of Onset   Cancer Mother        LUNG   Heart disease Father 4       MI   Cancer Paternal Aunt        type unknown   Cancer Paternal Uncle        type unknown   Hypertension Brother    Diabetes Neg Hx    Stroke Neg Hx    Colon cancer Neg Hx    Esophageal cancer Neg Hx    Pancreatic cancer Neg Hx    Stomach cancer Neg Hx    Rectal cancer Neg Hx     Past Surgical History:  Procedure Laterality Date   COLONOSCOPY  10/2010   diverticulosis, othewrise normal, repeat 10 years,  Dr. Marina Goodell   SPINE SURGERY  2011(JENKINS,MD)   CERVICAL DISC AND FUSION   TOTAL KNEE ARTHROPLASTY Left 01/23/2023   Procedure: LEFT TOTAL KNEE ARTHROPLASTY;  Surgeon: Tarry Kos, MD;  Location: [facility] OR;  Service: Orthopedics;  Laterality: Left;   Social History   Occupational History   Occupation: Therapist, nutritional: [employer]  Tobacco Use   Smoking status: Some Days    Current packs/day: 0.00    Average packs/day: 0.6 packs/day for 41.0 years (25.8 ttl pk-yrs)    Types: Cigarettes    Start date: 05/15/1976    Last attempt to quit: 05/15/2017    Years since quitting: 5.9   Smokeless tobacco: Never   Tobacco comments:    Some day smoker , smokes about 2 cigarettes a month socially  Vaping Use   Vaping status: Never Used  Substance and Sexual Activity   Alcohol use: Yes    Alcohol/week: 10.0 standard drinks of alcohol    Types: 10 Cans of beer per week     Comment: 10 drinks per week, sometimes more   Drug use: Not Currently    Comment: 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

## 2023-04-25 ENCOUNTER — Ambulatory Visit: Payer: BC Managed Care – PPO | Admitting: Orthopaedic Surgery

## 2023-04-25 ENCOUNTER — Other Ambulatory Visit: Payer: BC Managed Care – PPO

## 2023-05-01 ENCOUNTER — Other Ambulatory Visit (INDEPENDENT_AMBULATORY_CARE_PROVIDER_SITE_OTHER): Payer: BC Managed Care – PPO

## 2023-05-01 DIAGNOSIS — Z23 Encounter for immunization: Secondary | ICD-10-CM

## 2023-05-01 DIAGNOSIS — E559 Vitamin D deficiency, unspecified: Secondary | ICD-10-CM

## 2023-05-02 LAB — VITAMIN D 25 HYDROXY (VIT D DEFICIENCY, FRACTURES): Vit D, 25-Hydroxy: 41.3 ng/mL (ref 30.0–100.0)

## 2023-05-03 ENCOUNTER — Encounter: Payer: Self-pay | Admitting: Family Medicine

## 2023-05-03 ENCOUNTER — Ambulatory Visit: Payer: BC Managed Care – PPO | Admitting: Family Medicine

## 2023-05-03 VITALS — BP 132/72 | HR 71 | Wt 232.2 lb

## 2023-05-03 DIAGNOSIS — Z96652 Presence of left artificial knee joint: Secondary | ICD-10-CM

## 2023-05-03 NOTE — Progress Notes (Signed)
Subjective:    Patient ID: Ivin Booty, male    DOB: 07-11-1961, 62 y.o.   MRN: [id number]  HPI He is here to discuss the possibility of handicap placard.  He did have a left TKR done and is down the road about 3 months.  The last note from orthopedics indicates he is doing quite nicely however he still does feel slightly unstable and is using a cane more for psychological reasons and actually needing it.  He continues to work with physical therapy.  He does state that he does have trouble walking much more than several 100 feet.   Review of Systems     Objective:    Physical Exam Alert and in no distress.  He lacks full extension of his knee and is around 115 degrees of flexion.       Assessment & Plan:  Status post total left knee replacement I discussed continuing with physical therapy to work on getting better extension and continue to work on flexion.  Discussed the importance of quad exercises.  I will give him a note for another 3 months to help build up his strength.  Explained that if he needs it past that point then further evaluation from Dr. Roda Shutters would be appropriate.  He was appreciative of that. Also recommend he come back in the future for Tdap as he is over 10 years.

## 2023-05-09 ENCOUNTER — Other Ambulatory Visit: Payer: Self-pay | Admitting: Nurse Practitioner

## 2023-05-09 DIAGNOSIS — E559 Vitamin D deficiency, unspecified: Secondary | ICD-10-CM

## 2023-05-09 MED ORDER — VITAMIN D3 1.25 MG (50000 UT) PO TABS
1.0000 | ORAL_TABLET | ORAL | 3 refills | Status: DC
Start: 2023-05-09 — End: 2023-09-08

## 2023-05-21 ENCOUNTER — Other Ambulatory Visit: Payer: Self-pay | Admitting: Family Medicine

## 2023-05-21 DIAGNOSIS — E782 Mixed hyperlipidemia: Secondary | ICD-10-CM

## 2023-05-22 ENCOUNTER — Other Ambulatory Visit: Payer: Self-pay | Admitting: Family Medicine

## 2023-05-22 DIAGNOSIS — N528 Other male erectile dysfunction: Secondary | ICD-10-CM

## 2023-05-23 ENCOUNTER — Telehealth: Payer: Self-pay

## 2023-05-23 DIAGNOSIS — N528 Other male erectile dysfunction: Secondary | ICD-10-CM

## 2023-05-23 MED ORDER — TADALAFIL 20 MG PO TABS
20.0000 mg | ORAL_TABLET | Freq: Every day | ORAL | 1 refills | Status: DC | PRN
Start: 2023-05-23 — End: 2023-09-08

## 2023-05-23 NOTE — Telephone Encounter (Signed)
Faxed request for a drug change. Tadalafil was not covered. Preferred drugs are sildenafil and vardenafil.   Spoke with pharmacy and called in vardenafil 20 mg #20 with no refills.   Just sending this as an FYI and just in case another request comes in.

## 2023-05-23 NOTE — Telephone Encounter (Signed)
Faxed request for a drug change. Sildenafil is not covered. Preferred drugs are: tadalafil tab or vardenafil tab.   Last appt. 10/20/22. No future appts.   Send to [facility] on [address]

## 2023-06-13 ENCOUNTER — Encounter: Payer: Self-pay | Admitting: Physical Therapy

## 2023-06-13 ENCOUNTER — Other Ambulatory Visit: Payer: Self-pay | Admitting: Physician Assistant

## 2023-06-13 DIAGNOSIS — Z96642 Presence of left artificial hip joint: Secondary | ICD-10-CM

## 2023-06-13 NOTE — Telephone Encounter (Signed)
Order placed

## 2023-06-19 ENCOUNTER — Encounter: Payer: Self-pay | Admitting: Physical Therapy

## 2023-06-19 ENCOUNTER — Ambulatory Visit (INDEPENDENT_AMBULATORY_CARE_PROVIDER_SITE_OTHER): Payer: BC Managed Care – PPO | Admitting: Physical Therapy

## 2023-06-19 ENCOUNTER — Other Ambulatory Visit: Payer: Self-pay

## 2023-06-19 DIAGNOSIS — M6281 Muscle weakness (generalized): Secondary | ICD-10-CM

## 2023-06-19 DIAGNOSIS — R262 Difficulty in walking, not elsewhere classified: Secondary | ICD-10-CM | POA: Diagnosis not present

## 2023-06-19 DIAGNOSIS — G8929 Other chronic pain: Secondary | ICD-10-CM

## 2023-06-19 DIAGNOSIS — M25662 Stiffness of left knee, not elsewhere classified: Secondary | ICD-10-CM

## 2023-06-19 DIAGNOSIS — R600 Localized edema: Secondary | ICD-10-CM

## 2023-06-19 DIAGNOSIS — M25562 Pain in left knee: Secondary | ICD-10-CM

## 2023-06-19 NOTE — Therapy (Signed)
OUTPATIENT PHYSICAL THERAPY EVALUATION   Patient Name: James Bird MRN: [id number] DOB:1961-01-15, 61 y.o., male Today's Date: 06/19/2023  END OF SESSION:  PT End of Session - 06/19/23 1423     Visit Number 1    Date for PT Re-Evaluation 07/17/23    Authorization Type [employer] State $52 copay    PT Start Time 1425    PT Stop Time 1458    PT Time Calculation (min) 33 min    Activity Tolerance Patient tolerated treatment well    Behavior During Therapy [facility] for tasks assessed/performed              Past Medical History:  Diagnosis Date   Allergy    RHINITS   Chronic shoulder pain    left   Constipation 2019   Diverticulosis    ED (erectile dysfunction)    Former smoker    quit late 2018   H/O echocardiogram 03/20/08   mild - moderate LVH, EF 50-55%, mild RV enlargement, mild mitral regurgitation, Dr. 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 Reyes Ivan   History of neck surgery    Hyperlipidemia    Hypertension    Impaired fasting glucose    Past Surgical History:  Procedure Laterality Date   COLONOSCOPY  10/2010   diverticulosis, othewrise normal, repeat 10 years, Dr. Marina Goodell   SPINE SURGERY  2011(JENKINS,MD)   CERVICAL DISC AND FUSION   TOTAL KNEE ARTHROPLASTY Left 01/23/2023   Procedure: LEFT TOTAL KNEE ARTHROPLASTY;  Surgeon: Tarry Kos, MD;  Location: [facility] OR;  Service: Orthopedics;  Laterality: Left;   Patient Active Problem List   Diagnosis Date Noted   Status post total left knee replacement 01/23/2023   History of smoking 10-25 pack years 08/24/2021   History of colonic polyps 08/24/2021   H/O cervical discectomy 08/24/2021   Constipation 04/16/2020   Family history of heart disease 02/21/2019   Paresthesia 05/24/2018   Elevated PSA 05/24/2018   Nail deformity 05/24/2018   Onychomycosis 05/24/2018   Tinea pedis 05/24/2018   Lumbar radiculopathy  04/25/2018   Primary osteoarthritis of left knee 03/29/2017   Clonus 03/29/2017   Right foot drop 03/29/2017   Routine general medical examination at a health care facility 12/14/2015   Obesity 12/11/2014   Hyperlipidemia 11/28/2012   Erectile dysfunction 11/28/2012   Essential hypertension, benign 11/02/2011   Impaired fasting blood sugar 11/02/2011    PCP: Ronnald Nian MD  REFERRING PROVIDER: Cristie Hem, PA-C  REFERRING DIAG: [phone] (ICD-10-CM) - Total knee replacement status   THERAPY DIAG:  Chronic pain of left knee - Plan: PT plan of care cert/re-cert  Muscle weakness (generalized) - Plan: PT plan of care cert/re-cert  Stiffness of left knee, not elsewhere classified - Plan: PT plan of care cert/re-cert  Difficulty in walking, not elsewhere classified - Plan: PT plan of care cert/re-cert  Localized edema - Plan: PT plan of care cert/re-cert  Rationale for Evaluation and Treatment: Rehabilitation  ONSET DATE: Surgery 01/23/2023  SUBJECTIVE:   SUBJECTIVE STATEMENT: Pt returns to OPPT following Lt TKA on 01/23/23.  He completed his OPPT here in Aug 2024, and feels at this time his strength is still limited.  He goes to the gym about 3-5x/wk which consists of bike, walking the track (1/2 mile-1 mile), then doing the knee extension machine, hamstring curls.  He isn't doing the leg press as much.  PERTINENT HISTORY: PMH includes diverticulosis, ED, herpes simplex, HLD, HTN.  PAIN:  NPRS scale: at worst 4/10, at current 1/10, at best 0/10 Pain location: Lt knee  Pain description: stiffness, achy Aggravating factors: static positioning, after exercise at the gym Relieving factors: moving around, ice, stretching  PRECAUTIONS: None  WEIGHT BEARING RESTRICTIONS: No  FALLS:  Has patient fallen in last 6 months? No  LIVING ENVIRONMENT: Lives with: lives alone Lives in: House/apartment Stairs: has stairs for laundry.  3 stairs with rails Has following  equipment at home: FWW  OCCUPATION: Database admin.  Computer work.    PLOF: Independent, racquet ball  PATIENT GOALS: Improve strength; make sure he's doing the right exercises  OBJECTIVE:   PATIENT SURVEYS:  06/19/2023 FOTO intake:  66  predicted:  73  COGNITION: 06/19/23 Overall cognitive status: WFL   POSTURE:  06/19/23 Difficulty in sitting, lying down tolerance to static postures.    LOWER EXTREMITY ROM:   ROM Left 06/19/2023  Knee flexion 125 AROM in supine heel slide  Knee extension  0 AROM in seated LAQ   (Blank rows = not tested)  LOWER EXTREMITY MMT:  MMT Right 06/19/23 Left 06/19/23  Knee flexion 5/5 5/5  Knee extension 104.05#  104.7#   (Blank rows = not tested)   GAIT: 06/19/2023 Mild antalgic gait noted on Lt, independent otherwise                                                                                                                                                                         TODAY'S TREATMENT                                                                          DATE: 06/19/2023 Therex:  Provided gym program for pt with a few modifications from current program.  Encouraged to continue at gym.   PATIENT EDUCATION:  06/19/23 Education details: HEP, POC Person educated: Patient Education method: Programmer, multimedia, Demonstration, Verbal cues, and Handouts Education comprehension: verbalized understanding, returned demonstration, and verbal cues required  HOME EXERCISE PROGRAM: Access Code: [email or web address] URL: [email or web address] Date: 06/19/2023 Prepared by: Moshe Cipro  Exercises - Single Leg Knee Extension with Weight Machine  - 1 x daily - 7 x weekly - 3 sets - 10 reps - Single Leg Hamstring Curl with Weight Machine  - 1 x daily - 7 x weekly - 3 sets - 10  reps - Single Leg Press  - 1 x daily - 7 x weekly - 3 sets - 10 reps - Goblet Squat with Kettlebell  - 1 x daily - 7 x weekly - 3 sets - 10 reps - Lateral  Lunge  - 1 x daily - 7 x weekly - 3 sets - 10 reps - Kettlebell Deadlift  - 1 x daily - 7 x weekly - 3 sets - 10 reps - Single Leg Deadlift with Kettlebell  - 1 x daily - 7 x weekly - 3 sets - 10 reps  ASSESSMENT:  CLINICAL IMPRESSION: Patient is a 62 y.o. who comes to clinic with complaints of Lt knee pain s/p Lt TKA in June 2024.  Overall he demonstrates some mild gait deviations but his strength and ROM are excellent.  Modified his current gym program today and feel overall he can continue with the [facility].  He will call me if additional appts are indicated.   OBJECTIVE IMPAIRMENTS: Abnormal gait, decreased balance, and pain.   ACTIVITY LIMITATIONS: standing, squatting, stairs, and locomotion level  PARTICIPATION LIMITATIONS: cleaning, laundry, interpersonal relationship, driving, shopping, and community activity  PERSONAL FACTORS:  PMH includes diverticulosis, ED, herpes simplex, HLD, HTN.  are also affecting patient's functional outcome.   REHAB POTENTIAL: Good  CLINICAL DECISION MAKING: Stable/uncomplicated  EVALUATION COMPLEXITY: Low   GOALS: To be determined if pt returns   PLAN:  PT FREQUENCY: 1x/wk if needed  PT DURATION: 4 weeks  PLANNED INTERVENTIONS: Therapeutic exercises, Therapeutic activity, Neuro Muscular re-education, Balance training, Gait training, Patient/Family education, Joint mobilization, Stair training, DME instructions, Dry Needling, Electrical stimulation, Traction, Cryotherapy, vasopneumatic deviceMoist heat, Taping, Ultrasound, Ionotophoresis 4mg /ml Dexamethasone, and aquatic therapy, Manual therapy.  All included unless contraindicated  PLAN FOR NEXT SESSION: if pt returns, reassess and adjust HEP PRN, otherwise plan to d/c   Clarita Crane, PT, DPT 06/19/23 3:06 PM

## 2023-07-11 ENCOUNTER — Ambulatory Visit: Payer: BC Managed Care – PPO | Admitting: Orthopaedic Surgery

## 2023-07-11 ENCOUNTER — Other Ambulatory Visit (INDEPENDENT_AMBULATORY_CARE_PROVIDER_SITE_OTHER): Payer: Self-pay

## 2023-07-11 DIAGNOSIS — Z96652 Presence of left artificial knee joint: Secondary | ICD-10-CM

## 2023-07-11 NOTE — Progress Notes (Signed)
Post-Op Visit Note   Patient: James Bird           Date of Birth: 1960-10-04           MRN: [id number] Visit Date: 07/11/2023 PCP: Ronnald Nian, MD   Assessment & Plan:  Chief Complaint:  Chief Complaint  Patient presents with   Left Knee - Follow-up, Routine Post Op   Visit Diagnoses:  1. Status post total left knee replacement     Plan: Mr. Mcclenney is 6 months status post left total knee replacement.  He is doing well overall.  His only complaint is start up pain and stiffness at times.  Exam of the left knee shows a fully healed surgical scar.  Excellent range of motion.  Normal gait pattern.  Collaterals are stable.  X-rays show stable implant without any complications.  Dental prophylaxis reinforced.  Recheck in 6 months with two-view x-rays left knee.  Follow-Up Instructions: Return in about 6 months (around 01/08/2024).   Orders:  Orders Placed This Encounter  Procedures   XR Knee 1-2 Views Left   No orders of the defined types were placed in this encounter.   Imaging: No results found.  PMFS History: Patient Active Problem List   Diagnosis Date Noted   Status post total left knee replacement 01/23/2023   History of smoking 10-25 pack years 08/24/2021   History of colonic polyps 08/24/2021   H/O cervical discectomy 08/24/2021   Constipation 04/16/2020   Family history of heart disease 02/21/2019   Paresthesia 05/24/2018   Elevated PSA 05/24/2018   Nail deformity 05/24/2018   Onychomycosis 05/24/2018   Tinea pedis 05/24/2018   Lumbar radiculopathy 04/25/2018   Primary osteoarthritis of left knee 03/29/2017   Clonus 03/29/2017   Right foot drop 03/29/2017   Routine general medical examination at a health care facility 12/14/2015   Obesity 12/11/2014   Hyperlipidemia 11/28/2012   Erectile dysfunction 11/28/2012   Essential hypertension, benign 11/02/2011   Impaired fasting blood sugar 11/02/2011   Past Medical History:  Diagnosis Date    Allergy    RHINITS   Chronic shoulder pain    left   Constipation 2019   Diverticulosis    ED (erectile dysfunction)    Former smoker    quit late 2018   H/O echocardiogram 03/20/08   mild - moderate LVH, EF 50-55%, mild RV enlargement, mild mitral regurgitation, Dr. 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 Reyes Ivan   History of neck surgery    Hyperlipidemia    Hypertension    Impaired fasting glucose     Family History  Problem Relation Age of Onset   Cancer Mother        LUNG   Heart disease Father 75       MI   Cancer Paternal Aunt        type unknown   Cancer Paternal Uncle        type unknown   Hypertension Brother    Diabetes Neg Hx    Stroke Neg Hx    Colon cancer Neg Hx    Esophageal cancer Neg Hx    Pancreatic cancer Neg Hx    Stomach cancer Neg Hx    Rectal cancer Neg Hx     Past Surgical History:  Procedure Laterality Date   COLONOSCOPY  10/2010  diverticulosis, othewrise normal, repeat 10 years, Dr. Marina Goodell   SPINE SURGERY  2011(JENKINS,MD)   CERVICAL DISC AND FUSION   TOTAL KNEE ARTHROPLASTY Left 01/23/2023   Procedure: LEFT TOTAL KNEE ARTHROPLASTY;  Surgeon: Tarry Kos, MD;  Location: [facility] OR;  Service: Orthopedics;  Laterality: Left;   Social History   Occupational History   Occupation: Therapist, nutritional: [employer]  Tobacco Use   Smoking status: Former    Current packs/day: 0.00    Average packs/day: 0.6 packs/day for 41.0 years (25.8 ttl pk-yrs)    Types: Cigarettes    Start date: 05/15/1976    Quit date: 05/15/2017    Years since quitting: 6.1   Smokeless tobacco: Never   Tobacco comments:    Stopped Feb 2024  Vaping Use   Vaping status: Never Used  Substance and Sexual Activity   Alcohol use: Yes    Alcohol/week: 10.0 standard drinks of alcohol    Types: 10 Cans of beer per week    Comment: 10 drinks per week,  sometimes more   Drug use: Not Currently    Comment: 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

## 2023-08-26 ENCOUNTER — Other Ambulatory Visit: Payer: Self-pay | Admitting: Family Medicine

## 2023-08-26 DIAGNOSIS — I10 Essential (primary) hypertension: Secondary | ICD-10-CM

## 2023-09-08 ENCOUNTER — Ambulatory Visit: Payer: 59 | Admitting: Medical

## 2023-09-08 VITALS — BP 130/80 | HR 79 | Temp 97.0°F | Wt 239.0 lb

## 2023-09-08 DIAGNOSIS — S76011A Strain of muscle, fascia and tendon of right hip, initial encounter: Secondary | ICD-10-CM

## 2023-09-08 DIAGNOSIS — S76211A Strain of adductor muscle, fascia and tendon of right thigh, initial encounter: Secondary | ICD-10-CM | POA: Diagnosis not present

## 2023-09-08 DIAGNOSIS — E782 Mixed hyperlipidemia: Secondary | ICD-10-CM | POA: Diagnosis not present

## 2023-09-08 MED ORDER — IBUPROFEN 600 MG PO TABS: 600.0000 mg | ORAL_TABLET | Freq: Three times a day (TID) | ORAL | 0 refills | Status: DC | PRN

## 2023-09-08 MED ORDER — ATORVASTATIN CALCIUM 20 MG PO TABS: 20.0000 mg | ORAL_TABLET | Freq: Every day | ORAL | 0 refills | Status: DC

## 2023-09-08 NOTE — Progress Notes (Signed)
Subjective:  James Bird is a 63 y.o. male who presents for Chief Complaint  Patient presents with   pulled something     Pulled something in right groin. Pain since yesterday morning.      Here for pain in the right groin area. Had left knee surgery in June.  Has been exercising, and yesterday was playing pickleball.  When he was moving to hit a pickleball yesterday he felt a sudden pain in his right groin.  He was able to continue playing that game but then did not play anymore games that day.  He has had soreness in that area ever since.  No fever, no urinary changes, no testicle pain or swelling.  No other issue.   He does need his cholesterol medicine refill  No other aggravating or relieving factors.    No other c/o.  Past Medical History:  Diagnosis Date   Allergy    RHINITS   Chronic shoulder pain    left   Constipation 2019   Diverticulosis    ED (erectile dysfunction)    Former smoker    quit late 2018   H/O echocardiogram 03/20/08   mild - moderate LVH, EF 50-55%, mild RV enlargement, mild mitral regurgitation, Dr. 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 Reyes Ivan   History of neck surgery    Hyperlipidemia    Hypertension    Impaired fasting glucose    Current Outpatient Medications on File Prior to Visit  Medication Sig Dispense Refill   doxylamine, Sleep, (UNISOM) 25 MG tablet Take 12.5 mg by mouth at bedtime as needed for sleep.     linaclotide (LINZESS) 145 MCG CAPS capsule Take 1 capsule (145 mcg total) by mouth daily before breakfast. 4 capsule 0   lisinopril-hydrochlorothiazide (ZESTORETIC) 20-12.5 MG tablet TAKE 1 TABLET BY MOUTH DAILY 90 tablet 3   aspirin EC 81 MG tablet Take 1 tablet (81 mg total) by mouth 2 (two) times daily. To  be taken after surgery to prevent blood clots (Patient not taking: Reported on 09/08/2023) 84 tablet 0   No current  facility-administered medications on file prior to visit.     The following portions of the patient's history were reviewed and updated as appropriate: allergies, current medications, past family history, past medical history, past social history, past surgical history and problem list.  ROS Otherwise as in subjective above  Objective: BP 130/80   Pulse 79   Temp (!) 97 F (36.1 C)   Wt 239 lb (108.4 kg)   SpO2 98%   BMI 33.33 kg/m   General appearance: alert, no distress, well developed, well nourished Tender over the right hip flexors and right sartorius origin.  He has some mild pain and tenderness with active and resisted hip flexion and hip abduction in the same area.  No swelling, no deformity. Legs neurovascularly intact GU: Normal male, nontender scrotum and no deformity    Assessment: Encounter Diagnoses  Name Primary?   Mixed hyperlipidemia    Strain of flexor muscle of right hip, initial encounter Yes   Inguinal strain, right, initial encounter      Plan: Groin muscle strain and hip flexor strain  Recommendations: Begin ibuprofen 600 mg 2 or 3 times a day over the weekend and through Monday or Tuesday.  This is for pain and inflammation After Tuesday just  use it as needed Over the weekend you can use alternating heat and cold therapy to the inner upper thigh where you are having pain.  Today for example you can do a cool pack alternating after 20 minutes and after the skin warms back to room temp  then you can do heat for 20 minutes You can alternate heat and cold through the weekend The main treatment this weekend is resting the leg.  So no heavy lifting anything over the weekend, try to avoid using a lot of stairs, no strenuous activity through the weekend. By middle of next week if you are feeling less pain then I would do some circular stretches and range of motion activity of your legs and hips.  Later next week if feeling much better then you can get back  to doing your regular activity Strain of muscle injury can take sometimes 1 to 3 weeks to heal but usually short-term  [name] was seen today for pulled something .  Diagnoses and all orders for this visit:  Strain of flexor muscle of right hip, initial encounter  Mixed hyperlipidemia -     atorvastatin (LIPITOR) 20 MG tablet; Take 1 tablet (20 mg total) by mouth daily.  Inguinal strain, right, initial encounter  Other orders -     ibuprofen (ADVIL) 600 MG tablet; Take 1 tablet (600 mg total) by mouth every 8 (eight) hours as needed.    Follow up: prn

## 2023-09-08 NOTE — Patient Instructions (Addendum)
Groin muscle strain and hip flexor strain  Recommendations: Begin ibuprofen 600 mg 2 or 3 times a day over the weekend and through Monday or Tuesday.  This is for pain and inflammation After Tuesday just use it as needed Over the weekend you can use alternating heat and cold therapy to the inner upper thigh where you are having pain.  Today for example you can do a cool pack alternating after 20 minutes and after the skin warms back to room temp  then you can do heat for 20 minutes You can alternate heat and cold through the weekend The main treatment this weekend is resting the leg.  So no heavy lifting anything over the weekend, try to avoid using a lot of stairs, no strenuous activity through the weekend. By middle of next week if you are feeling less pain then I would do some circular stretches and range of motion activity of your legs and hips.  Later next week if feeling much better then you can get back to doing your regular activity Strain of muscle injury can take sometimes 1 to 3 weeks to heal but usually short-term

## 2023-10-03 ENCOUNTER — Telehealth: Payer: Self-pay

## 2023-10-03 NOTE — Telephone Encounter (Signed)
 e

## 2023-10-30 ENCOUNTER — Ambulatory Visit: Payer: 59 | Admitting: Medical

## 2023-10-30 VITALS — BP 120/70 | HR 68 | Ht 71.5 in | Wt 236.0 lb

## 2023-10-30 DIAGNOSIS — Z0000 Encounter for general adult medical examination without abnormal findings: Secondary | ICD-10-CM

## 2023-10-30 DIAGNOSIS — R7301 Impaired fasting glucose: Secondary | ICD-10-CM

## 2023-10-30 DIAGNOSIS — Z87891 Personal history of nicotine dependence: Secondary | ICD-10-CM

## 2023-10-30 DIAGNOSIS — K5900 Constipation, unspecified: Secondary | ICD-10-CM

## 2023-10-30 DIAGNOSIS — E782 Mixed hyperlipidemia: Secondary | ICD-10-CM

## 2023-10-30 DIAGNOSIS — I2510 Atherosclerotic heart disease of native coronary artery without angina pectoris: Secondary | ICD-10-CM

## 2023-10-30 DIAGNOSIS — I10 Essential (primary) hypertension: Secondary | ICD-10-CM

## 2023-10-30 DIAGNOSIS — Z23 Encounter for immunization: Secondary | ICD-10-CM | POA: Diagnosis not present

## 2023-10-30 DIAGNOSIS — I700 Atherosclerosis of aorta: Secondary | ICD-10-CM

## 2023-10-30 DIAGNOSIS — Z1211 Encounter for screening for malignant neoplasm of colon: Secondary | ICD-10-CM

## 2023-10-30 LAB — LIPID PANEL

## 2023-10-30 MED ORDER — ATORVASTATIN CALCIUM 20 MG PO TABS: 20.0000 mg | ORAL_TABLET | Freq: Every day | ORAL | 3 refills | Status: AC

## 2023-10-30 MED ORDER — ASPIRIN 81 MG PO TBEC: 81.0000 mg | DELAYED_RELEASE_TABLET | Freq: Two times a day (BID) | ORAL | 3 refills | Status: AC

## 2023-10-30 MED ORDER — LINACLOTIDE 145 MCG PO CAPS: 145.0000 ug | ORAL_CAPSULE | Freq: Every day | ORAL | 2 refills | Status: AC

## 2023-10-30 MED ORDER — LISINOPRIL-HYDROCHLOROTHIAZIDE 20-12.5 MG PO TABS: 1.0000 | ORAL_TABLET | Freq: Every day | ORAL | 3 refills | Status: AC

## 2023-10-30 NOTE — Progress Notes (Signed)
 Subjective:   HPI  James Bird is a 63 y.o. male who presents for Chief Complaint  Patient presents with   Annual Exam    Fasting cpe, no concerns    Patient Care Team: Kjell Brannen, Kermit Balo, PA-C as PCP - General (Family Medicine) Tarry Kos, MD as Attending Physician (Orthopedic Surgery) Huel Cote, MD as Consulting Physician (Orthopedic Surgery)   Concerns: Doing well except his knee pains which are chronic, s/p prior surgery.    Compliant with statin and BP medicaiton.  Not currently taking aspirin daily  Uses linzess for constipation periodically   Reviewed their medical, surgical, family, social, medication, and allergy history and updated chart as appropriate.  No Known Allergies  Past Medical History:  Diagnosis Date   Allergy    RHINITS   Chronic shoulder pain    left   Constipation 2019   Diverticulosis    ED (erectile dysfunction)    Former smoker    quit late 2018   H/O echocardiogram 03/20/08   mild - moderate LVH, EF 50-55%, mild RV enlargement, mild mitral regurgitation, Dr. 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 Reyes Ivan   History of neck surgery    Hyperlipidemia    Hypertension    Impaired fasting glucose     Current Outpatient Medications on File Prior to Visit  Medication Sig Dispense Refill   doxylamine, Sleep, (UNISOM) 25 MG tablet Take 12.5 mg by mouth at bedtime as needed for sleep.     ibuprofen (ADVIL) 600 MG tablet Take 1 tablet (600 mg total) by mouth every 8 (eight) hours as needed. 30 tablet 0   No current facility-administered medications on file prior to visit.      Current Outpatient Medications:    doxylamine, Sleep, (UNISOM) 25 MG tablet, Take 12.5 mg by mouth at bedtime as needed for sleep., Disp: , Rfl:    ibuprofen (ADVIL) 600 MG tablet, Take 1 tablet (600 mg total) by mouth every 8 (eight) hours as needed.,  Disp: 30 tablet, Rfl: 0   aspirin EC 81 MG tablet, Take 1 tablet (81 mg total) by mouth 2 (two) times daily. To  be taken after surgery to prevent blood clots, Disp: 90 tablet, Rfl: 3   atorvastatin (LIPITOR) 20 MG tablet, Take 1 tablet (20 mg total) by mouth daily., Disp: 90 tablet, Rfl: 3   linaclotide (LINZESS) 145 MCG CAPS capsule, Take 1 capsule (145 mcg total) by mouth daily before breakfast., Disp: 30 capsule, Rfl: 2   lisinopril-hydrochlorothiazide (ZESTORETIC) 20-12.5 MG tablet, Take 1 tablet by mouth daily., Disp: 90 tablet, Rfl: 3  Family History  Problem Relation Age of Onset   Cancer Mother        LUNG   Heart disease Father 56       MI   Cancer Paternal Aunt        type unknown   Cancer Paternal Uncle        type unknown   Hypertension Brother    Diabetes Neg Hx    Stroke Neg Hx    Colon cancer Neg Hx    Esophageal cancer Neg Hx    Pancreatic cancer Neg Hx    Stomach cancer Neg Hx    Rectal cancer Neg Hx     Past Surgical History:  Procedure Laterality Date   COLONOSCOPY  10/2010  diverticulosis, othewrise normal, repeat 10 years, Dr. Marina Goodell   SPINE SURGERY  2011(JENKINS,MD)   CERVICAL DISC AND FUSION   TOTAL KNEE ARTHROPLASTY Left 01/23/2023   Procedure: LEFT TOTAL KNEE ARTHROPLASTY;  Surgeon: Tarry Kos, MD;  Location: [facility] OR;  Service: Orthopedics;  Laterality: Left;     Review of Systems  Constitutional:  Negative for chills, fever, malaise/fatigue and weight loss.  HENT:  Negative for congestion, ear pain, hearing loss, sore throat and tinnitus.   Eyes:  Negative for blurred vision, pain and redness.  Respiratory:  Negative for cough, hemoptysis and shortness of breath.   Cardiovascular:  Negative for chest pain, palpitations, orthopnea, claudication and leg swelling.  Gastrointestinal:  Negative for abdominal pain, blood in stool, constipation, diarrhea, nausea and vomiting.  Genitourinary:  Negative for dysuria, flank pain, frequency, hematuria and  urgency.  Musculoskeletal:  Positive for joint pain. Negative for falls and myalgias.       Knees  Skin:  Negative for itching and rash.  Neurological:  Negative for dizziness, tingling, speech change, weakness and headaches.  Endo/Heme/Allergies:  Negative for polydipsia. Does not bruise/bleed easily.  Psychiatric/Behavioral:  Negative for depression and memory loss. The patient is not nervous/anxious and does not have insomnia.         Objective:  BP 120/70   Pulse 68   Ht 5' 11.5" (1.816 m)   Wt 236 lb (107 kg)   SpO2 98%   BMI 32.46 kg/m   General appearance: alert, no distress, WD/WN, African American male Skin: unremarkable HEENT: normocephalic, conjunctiva/corneas normal, sclerae anicteric, PERRLA, EOMi, nares patent, no discharge or erythema, pharynx normal Oral cavity: MMM, tongue normal, teeth normal Neck: supple, no lymphadenopathy, no thyromegaly, no masses, normal ROM, no bruits Chest: non tender, normal shape and expansion Heart: RRR, normal S1, S2, no murmurs Lungs: CTA bilaterally, no wheezes, rhonchi, or rales Abdomen: +bs, soft, non tender, non distended, no masses, no hepatomegaly, no splenomegaly, no bruits Back: non tender, normal ROM, no scoliosis Musculoskeletal: upper extremities non tender, no obvious deformity, normal ROM throughout, lower extremities non tender, no obvious deformity, normal ROM throughout Extremities: no edema, no cyanosis, no clubbing Pulses: 2+ symmetric, upper and lower extremities, normal cap refill Neurological: alert, oriented x 3, CN2-12 intact, strength normal upper extremities and lower extremities, sensation normal throughout, DTRs 2+ throughout, no cerebellar signs, gait normal Psychiatric: normal affect, behavior normal, pleasant  GU/rectal - deferred     Assessment and Plan :   Encounter Diagnoses  Name Primary?   Encounter for health maintenance examination in adult Yes   Need for pneumococcal 20-valent conjugate  vaccination    Impaired fasting blood sugar    Mixed hyperlipidemia    History of smoking 10-25 pack years    Essential hypertension, benign    Constipation, unspecified constipation type    Aortic atherosclerosis (HCC)    Coronary artery calcification seen on CT scan    Screening for colon cancer     This visit was a preventative care visit, also known as wellness visit or routine physical.   Topics typically include healthy lifestyle, diet, exercise, preventative care, vaccinations, sick and well care, proper use of emergency dept and after hours care, as well as other concerns.     Separate significant issues discussed: Hypertension-controlled on current medication lisinopril HCT 20/12.5 mg daily  Hyperlipidemia-continue Lipitor 20 mg daily.  I recommend he add back aspirin 81 mg daily.  Constipation-uses Linzess periodically and does fine on this.  Aortic atherosclerosis on CT findings showing coronary disease-continue current medications including blood pressure medicine and Lipitor 20 mg daily.  Add back aspirin milligrams daily.  We discussed the role of Xarelto or Eliquis as a possible adjunct for prevention.  We also discussed consulting with cardiology.  I will reach out to his cardiologist to get other recommendations or to see if she wants to see him back.  He is asymptomatic.  He is exercising regularly.  Former smoker   General Recommendations: Continue to return yearly for your annual wellness and preventative care visits.  This gives [place] a chance to discuss healthy lifestyle, exercise, vaccinations, review your chart record, and perform screenings where appropriate.  I recommend you see your eye doctor yearly for routine vision care.  I recommend you see your dentist yearly for routine dental care including hygiene visits twice yearly.   Vaccination  Immunization History  Administered Date(s) Administered   Influenza Split 06/30/2015   Influenza Whole 09/10/2009    Influenza, Seasonal, Injecte, Preservative Fre 05/01/2023   Influenza,inj,Quad PF,6+ Mos 07/18/2016, 06/05/2017, 04/24/2018, 05/10/2022   Influenza-Unspecified 07/03/2021   Moderna Sars-Covid-2 Vaccination 07/01/2020   PFIZER(Purple Top)SARS-COV-2 Vaccination 11/13/2019, 12/11/2019   PNEUMOCOCCAL CONJUGATE-20 10/30/2023   Pfizer Covid-19 Vaccine Bivalent Booster 82yrs & up 07/03/2021   Pfizer(Comirnaty)Fall Seasonal Vaccine 12 years and older 10/20/2022, 05/01/2023   Pneumococcal Polysaccharide-23 11/28/2012   Td 03/11/2020   Tdap 09/10/2009   Zoster Recombinant(Shingrix) 08/24/2021, 10/27/2021      Screening for cancer: Colon cancer screening: Prior or last colon cancer screen: 07/2020 reviewed.   Due repeat 7-10 years.  Sent home with FIT testing today   Prostate Cancer screening: The recommended prostate cancer screening test is a blood test called the prostate-specific antigen (PSA) test. PSA is a protein that is made in the prostate. As you age, your prostate naturally produces more PSA. Abnormally high PSA levels may be caused by: Prostate cancer. An enlarged prostate that is not caused by cancer (benign prostatic hyperplasia, or BPH). This condition is very common in older men. A prostate gland infection (prostatitis) or urinary tract infection. Certain medicines such as male hormones (like testosterone) or other medicines that raise testosterone levels. A rectal exam may be done as part of prostate cancer screening to help provide information about the size of your prostate gland. When a rectal exam is performed, it should be done after the PSA level is drawn to avoid any effect on the results.   Skin cancer screening: Check your skin regularly for new changes, growing lesions, or other lesions of concern Come in for evaluation if you have skin lesions of concern.   Lung cancer screening: If you have a greater than 20 pack year history of tobacco use, then you may  qualify for lung cancer screening with a chest CT scan.   Please call your insurance company to inquire about coverage for this test.   Pancreatic cancer:  no current screening test is available or routinely recommended. (risk factors: smoking, overweight or obese, diabetes, chronic pancreatitis, work exposure - dry cleaning, metal working, 64yo>, M>F, Tree surgeon, family hx/o, hereditary breast, ovarian, melanoma, lynch, peutz-jeghers).  Symptoms: jaundice, dark urine, light color or greasy stools, itchy skin, belly or back pain, weight loss, poor appetite, nausea, vomiting, liver enlargement, DVT/blood clots.   We currently don't have screenings for other cancers besides breast, cervical, colon, and lung cancers.  If you have a strong family history of cancer or have other cancer screening concerns,  please let me know.  Genetic testing referral is an option for individuals with high cancer risk in the family.  There are some other cancer screenings in development currently.   Bone health: Get at least 150 minutes of aerobic exercise weekly Get weight bearing exercise at least once weekly Bone density test:  A bone density test is an imaging test that uses a type of X-ray to measure the amount of calcium and other minerals in your bones. The test may be used to diagnose or screen you for a condition that causes weak or thin bones (osteoporosis), predict your risk for a broken bone (fracture), or determine how well your osteoporosis treatment is working. The bone density test is recommended for females 65 and older, or females or males <65 if certain risk factors such as thyroid disease, long term use of steroids such as for asthma or rheumatological issues, vitamin D deficiency, estrogen deficiency, family history of osteoporosis, self or family history of fragility fracture in first degree relative.    Heart health: Get at least 150 minutes of aerobic exercise weekly Limit alcohol It is  important to maintain a healthy blood pressure and healthy cholesterol numbers  Heart disease screening: Screening for heart disease includes screening for blood pressure, fasting lipids, glucose/diabetes screening, BMI height to weight ratio, reviewed of smoking status, physical activity, and diet.    Goals include blood pressure 120/80 or less, maintaining a healthy lipid/cholesterol profile, preventing diabetes or keeping diabetes numbers under good control, not smoking or using tobacco products, exercising most days per week or at least 150 minutes per week of exercise, and eating healthy variety of fruits and vegetables, healthy oils, and avoiding unhealthy food choices like fried food, fast food, high sugar and high cholesterol foods.    Other tests may possibly include EKG test, CT coronary calcium score, echocardiogram, exercise treadmill stress test.     We discussed his CAD and aortic atherosclerosis findings on CT scan from 02/2023.  I will reach out to cardiology to see if Dr. Tenny Craw has recommendations.  He continues on statin and advised he get back on Aspirin 81mg  daily, and thankfully he quit smoking already.    Vascular disease screening: For higher risk individuals including smokers, diabetics, patients with known heart disease or high blood pressure, kidney disease, and others, screening for vascular disease or atherosclerosis of the arteries is available.  Examples may include carotid ultrasound, abdominal aortic ultrasound, ABI blood flow screening in the legs, thoracic aorta screening.    Medical care options: I recommend you continue to seek care here first for routine care.  We try really hard to have available appointments Monday through Friday daytime hours for sick visits, acute visits, and physicals.  Urgent care should be used for after hours and weekends for significant issues that cannot wait till the next day.  The emergency department should be used for significant  potentially life-threatening emergencies.  The emergency department is expensive, can often have long wait times for less significant concerns, so try to utilize primary care, urgent care, or telemedicine when possible to avoid unnecessary trips to the emergency department.  Virtual visits and telemedicine have been introduced since the pandemic started in 2020, and can be convenient ways to receive medical care.  We offer virtual appointments as well to assist you in a variety of options to seek medical care.   Legal  Take the time to do a last will and testament, Advanced Directives including Health Care  Power of [employer] and Living Will documents.  Don't leave your family with burdens that can be handled ahead of time.   Advanced Directives: I recommend you consider completing a Health Care Power of Attorney and Living Will.   These documents respect your wishes and help alleviate burdens on your loved ones if you were to become terminally ill or be in a position to need those documents enforced.    You can complete Advanced Directives yourself, have them notarized, then have copies made for our office, for you and for anybody you feel should have them in safe keeping.  Or, you can have an attorney prepare these documents.   If you haven't updated your Last Will and Testament in a while, it may be worthwhile having an attorney prepare these documents together and save on some costs.       Spiritual and Emotional Health Keeping a healthy spiritual life can help you better manage your physical health. Your spiritual life can help you to cope with any issues that may arise with your physical health.  Balance can keep [place] healthy and help [place] to recover.  If you are struggling with your spiritual health there are questions that you may want to ask yourself:  What makes me feel most complete? When do I feel most connected to the rest of the world? Where do I find the most inner strength? What am I  doing when I feel whole?  Helpful tips: Being in nature. Some people feel very connected and at peace when they are walking outdoors or are outside. Helping others. Some feel the largest sense of wellbeing when they are of service to others. Being of service can take on many forms. It can be doing volunteer work, being kind to strangers, or offering a hand to a friend in need. Gratitude. Some people find they feel the most connected when they remain grateful. They may make lists of all the things they are grateful for or say a thank you out loud for all they have.    Emotional Health Are you in tune with your emotional health?  Check out this link: http://www.marquez-love.com/    Financial Health Make sure you use a budget for your personal finances Make sure you are insured against risks (health insurance, life insurance, auto insurance, etc) Save more, spend less Set financial goals If you need help in this area, good resources include counseling through [employer] or other community resources, have a meeting with a Social research officer, government, and a good resource is the [employer] was seen today for annual exam.  Diagnoses and all orders for this visit:  Encounter for health maintenance examination in adult -     Comprehensive metabolic panel -     CBC -     Lipid panel -     Hemoglobin A1c  Need for pneumococcal 20-valent conjugate vaccination -     Pneumococcal conjugate vaccine 20-valent (Prevnar 20)  Impaired fasting blood sugar -     Hemoglobin A1c  Mixed hyperlipidemia -     Lipid panel -     atorvastatin (LIPITOR) 20 MG tablet; Take 1 tablet (20 mg total) by mouth daily.  History of smoking 10-25 pack years  Essential hypertension, benign -     lisinopril-hydrochlorothiazide (ZESTORETIC) 20-12.5 MG tablet; Take 1 tablet by mouth daily.  Constipation, unspecified constipation type  Aortic atherosclerosis (HCC)  Coronary  artery calcification seen on CT scan  Screening for colon cancer -     Fecal occult blood, imunochemical  Other orders -     linaclotide (LINZESS) 145 MCG CAPS capsule; Take 1 capsule (145 mcg total) by mouth daily before breakfast. -     aspirin EC 81 MG tablet; Take 1 tablet (81 mg total) by mouth 2 (two) times daily. To  be taken after surgery to prevent blood clots     Follow-up pending labs, yearly for physical

## 2023-10-31 ENCOUNTER — Other Ambulatory Visit: Payer: Self-pay | Admitting: Medical

## 2023-10-31 LAB — COMPREHENSIVE METABOLIC PANEL
ALT: 17 IU/L (ref 0–44)
AST: 18 IU/L (ref 0–40)
Albumin: 4.6 g/dL (ref 3.9–4.9)
Alkaline Phosphatase: 72 IU/L (ref 44–121)
BUN/Creatinine Ratio: 18 (ref 10–24)
BUN: 16 mg/dL (ref 8–27)
Bilirubin Total: 0.9 mg/dL (ref 0.0–1.2)
CO2: 24 mmol/L (ref 20–29)
Calcium: 10.1 mg/dL (ref 8.6–10.2)
Chloride: 101 mmol/L (ref 96–106)
Creatinine, Ser: 0.9 mg/dL (ref 0.76–1.27)
Globulin, Total: 2.3 g/dL (ref 1.5–4.5)
Glucose: 94 mg/dL (ref 70–99)
Potassium: 4 mmol/L (ref 3.5–5.2)
Sodium: 140 mmol/L (ref 134–144)
Total Protein: 6.9 g/dL (ref 6.0–8.5)
eGFR: 96 mL/min/{1.73_m2} (ref >59)

## 2023-10-31 LAB — LIPID PANEL
Cholesterol, Total: 175 mg/dL (ref 100–199)
HDL: 47 mg/dL (ref >39)
LDL CALC COMMENT:: 3.7 ratio (ref 0.0–5.0)
LDL Chol Calc (NIH): 113 mg/dL — ABNORMAL HIGH (ref 0–99)
Triglycerides: 82 mg/dL (ref 0–149)
VLDL Cholesterol Cal: 15 mg/dL (ref 5–40)

## 2023-10-31 LAB — CBC
Hematocrit: 47.2 % (ref 37.5–51.0)
Hemoglobin: 15.7 g/dL (ref 13.0–17.7)
MCH: 29.9 pg (ref 26.6–33.0)
MCHC: 33.3 g/dL (ref 31.5–35.7)
MCV: 90 fL (ref 79–97)
Platelets: 227 10*3/uL (ref 150–450)
RBC: 5.25 x10E6/uL (ref 4.14–5.80)
RDW: 13.7 % (ref 11.6–15.4)
WBC: 5.6 10*3/uL (ref 3.4–10.8)

## 2023-10-31 LAB — HEMOGLOBIN A1C
Est. average glucose Bld gHb Est-mCnc: 123 mg/dL
Hgb A1c MFr Bld: 5.9 % — ABNORMAL HIGH (ref 4.8–5.6)

## 2023-10-31 NOTE — Progress Notes (Signed)
 Results sent through MyChart

## 2023-10-31 NOTE — Progress Notes (Signed)
 I will set to see I'm in clinic to review how he is doing, question him closely   I last saw him 5 years ago Re Xarelto and Eliquis- I am not using routinely in pts with CAD for primary prevention

## 2023-11-04 LAB — FECAL OCCULT BLOOD, IMMUNOCHEMICAL: Fecal Occult Bld: NEGATIVE

## 2023-11-05 NOTE — Progress Notes (Signed)
 Results sent through MyChart

## 2023-11-06 ENCOUNTER — Encounter (HOSPITAL_BASED_OUTPATIENT_CLINIC_OR_DEPARTMENT_OTHER): Payer: Self-pay | Admitting: Orthopaedic Surgery

## 2023-11-06 ENCOUNTER — Other Ambulatory Visit: Payer: Self-pay | Admitting: Medical

## 2023-11-06 DIAGNOSIS — I10 Essential (primary) hypertension: Secondary | ICD-10-CM

## 2023-11-06 DIAGNOSIS — I2510 Atherosclerotic heart disease of native coronary artery without angina pectoris: Secondary | ICD-10-CM

## 2023-11-06 DIAGNOSIS — G8929 Other chronic pain: Secondary | ICD-10-CM

## 2023-11-06 DIAGNOSIS — I700 Atherosclerosis of aorta: Secondary | ICD-10-CM

## 2023-11-08 ENCOUNTER — Ambulatory Visit (HOSPITAL_BASED_OUTPATIENT_CLINIC_OR_DEPARTMENT_OTHER): Payer: Self-pay | Admitting: Orthopaedic Surgery

## 2023-11-08 ENCOUNTER — Encounter (HOSPITAL_BASED_OUTPATIENT_CLINIC_OR_DEPARTMENT_OTHER): Payer: Self-pay | Admitting: Orthopaedic Surgery

## 2023-11-08 ENCOUNTER — Ambulatory Visit (HOSPITAL_BASED_OUTPATIENT_CLINIC_OR_DEPARTMENT_OTHER)

## 2023-11-08 DIAGNOSIS — M25512 Pain in left shoulder: Secondary | ICD-10-CM

## 2023-11-08 DIAGNOSIS — G8929 Other chronic pain: Secondary | ICD-10-CM

## 2023-11-08 NOTE — Progress Notes (Signed)
 Chief Complaint: Bilateral shoulder pain     History of Present Illness:   11/08/2023: Presents today predominantly for follow-up of the left shoulder.  He is still experiencing some soreness although he did recently have a Pneumovax injection and does believe that a significant portion may be coming from that   James Bird is a 63 y.o. male presents today with ongoing left knee pain which has been significant for multiple months.  This has been worse over the last several weeks.  He has had a history of several injections the most recent of which was 2 months prior by Dr. Lindie Spruce.  This gave him no significant relief.  He has been undergoing physical therapy.  He is experiencing popping as well as crepitus in the joint.  He is here today for further assessment.  He does enjoy playing racquetball as well as going for runs and jogs although this has been limited as result of his knee pain    Surgical History:   None  PMH/PSH/Family History/Social History/Meds/Allergies:    Past Medical History:  Diagnosis Date   Allergy    RHINITS   Chronic shoulder pain    left   Constipation 2019   Diverticulosis    ED (erectile dysfunction)    Former smoker    quit late 2018   H/O echocardiogram 03/20/08   mild - moderate LVH, EF 50-55%, mild RV enlargement, mild mitral regurgitation, Dr. 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 Reyes Ivan   History of neck surgery    Hyperlipidemia    Hypertension    Impaired fasting glucose    Past Surgical History:  Procedure Laterality Date   COLONOSCOPY  10/2010   diverticulosis, othewrise normal, repeat 10 years, Dr. Marina Goodell   SPINE SURGERY  2011(JENKINS,MD)   CERVICAL DISC AND FUSION   TOTAL KNEE ARTHROPLASTY Left 01/23/2023   Procedure: LEFT TOTAL KNEE ARTHROPLASTY;  Surgeon: Tarry Kos, MD;  Location: [facility] OR;   Service: Orthopedics;  Laterality: Left;   Social History   Socioeconomic History   Marital status: Single    Spouse name: Not on file   Number of children: Not on file   Years of education: Not on file   Highest education level: Not on file  Occupational History   Occupation: Building control surveyor    Employer: [employer]  Tobacco Use   Smoking status: Former    Current packs/day: 0.00    Average packs/day: 0.6 packs/day for 41.0 years (25.8 ttl pk-yrs)    Types: Cigarettes    Start date: 05/15/1976    Quit date: 05/15/2017    Years since quitting: 6.4   Smokeless tobacco: Never   Tobacco comments:    Stopped Feb 2024  Vaping Use   Vaping status: Never Used  Substance and Sexual Activity   Alcohol use: Yes    Alcohol/week: 10.0 standard drinks of alcohol    Types: 10 Cans of beer per week    Comment: 10 drinks per week, sometimes more   Drug use: Not Currently    Comment: 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    02/2020   Social Drivers of Health   Financial Resource Strain: Low Risk  (10/30/2023)   Overall Financial Resource Strain (CARDIA)    Difficulty of Paying Living Expenses: Not hard at all  Food Insecurity: No Food Insecurity (10/30/2023)   Hunger Vital Sign    Worried About Running Out of Food in the Last Year: Never true    Ran Out of Food in the Last Year: Never true  Transportation Needs: No Transportation Needs (10/30/2023)   PRAPARE - Administrator, Civil Service (Medical): No    Lack of Transportation (Non-Medical): No  Physical Activity: Sufficiently Active (10/30/2023)   Exercise Vital Sign    Days of Exercise per Week: 3 days    Minutes of Exercise per Session: 60 min  Stress: No Stress Concern  Present (10/30/2023)   [employer] of Occupational Health - Occupational Stress Questionnaire    Feeling of Stress : Not at all  Social Connections: Moderately Integrated (10/30/2023)   Social Connection and Isolation Panel [NHANES]    Frequency of Communication with Friends and Family: Twice a week    Frequency of Social Gatherings with Friends and Family: Three times a week    Attends Religious Services: More than 4 times per year    Active Member of Clubs or Organizations: Yes    Attends Engineer, structural: More than 4 times per year    Marital Status: Divorced   Family History  Problem Relation Age of Onset   Cancer Mother        LUNG   Heart disease Father 28       MI   Cancer Paternal Aunt        type unknown   Cancer Paternal Uncle        type unknown   Hypertension Brother    Diabetes Neg Hx    Stroke Neg Hx    Colon cancer Neg Hx    Esophageal cancer Neg Hx    Pancreatic cancer Neg Hx    Stomach cancer Neg Hx    Rectal cancer Neg Hx    No Known Allergies Current Outpatient Medications  Medication Sig Dispense Refill   aspirin EC 81 MG tablet Take 1 tablet (81 mg total) by mouth 2 (two) times daily. To  be taken after surgery to prevent blood clots 90 tablet 3   atorvastatin (LIPITOR) 20 MG tablet Take 1 tablet (20 mg total) by mouth daily. 90 tablet 3   doxylamine, Sleep, (UNISOM) 25 MG tablet Take 12.5 mg by mouth at bedtime as needed for sleep.     ibuprofen (ADVIL) 600 MG tablet Take 1 tablet (600 mg total) by mouth every 8 (eight) hours as needed. 30 tablet 0   linaclotide (LINZESS) 145 MCG CAPS capsule Take 1 capsule (145 mcg total) by mouth daily before breakfast. 30 capsule 2   lisinopril-hydrochlorothiazide (ZESTORETIC) 20-12.5 MG tablet Take 1 tablet by mouth daily. 90 tablet 3   No current facility-administered medications for this visit.   No results found.  Review of Systems:   A ROS was performed including pertinent positives and  negatives as documented in the HPI.  Physical Exam :   Constitutional: NAD and appears stated age Neurological: Alert and oriented Psych: Appropriate affect and cooperative  There were no vitals taken for this visit.   Comprehensive Musculoskeletal Exam:    Right shoulder with forward elevation to 160 degrees compared to 170 degrees on the left.  External rotation at the side is 45 degrees bilaterally.  Internal rotation on the right is to L3 compared to T12 on the left.  Negative belly press full strength although there is pain with forward elevation and a positive Neer impingement  Imaging:   Xray (3 views right shoulder, 3 views left shoulder): Right shoulder mild osteoarthritis involving glenohumeral joint, negative left shoulder x-ray   I personally reviewed and interpreted the radiographs.   Assessment:   63 y.o. male with ongoing left shoulder pain consistent with rotator cuff tendinitis versus tearing.  At this time he is still hoping to see how much improvement he gets following his Pneumovax injection.  Given this he will plan to send me a message should he want to further consider an MRI to rule out any type of underlying tearing versus a left subacromial injection Plan :    -He will send [place] a message when he wants to further consider MRI versus injection I personally saw and evaluated the patient, and participated in the management and treatment plan.  Huel Cote, MD Attending Physician, Orthopedic Surgery  This document was dictated using Dragon voice recognition software. A reasonable attempt at proof reading has been made to minimize errors.

## 2023-11-09 ENCOUNTER — Encounter (HOSPITAL_BASED_OUTPATIENT_CLINIC_OR_DEPARTMENT_OTHER): Payer: Self-pay | Admitting: Student

## 2023-11-09 ENCOUNTER — Ambulatory Visit (HOSPITAL_BASED_OUTPATIENT_CLINIC_OR_DEPARTMENT_OTHER): Admitting: Student

## 2023-11-09 DIAGNOSIS — G8929 Other chronic pain: Secondary | ICD-10-CM

## 2023-11-09 DIAGNOSIS — M25512 Pain in left shoulder: Secondary | ICD-10-CM | POA: Diagnosis not present

## 2023-11-09 MED ORDER — TRIAMCINOLONE ACETONIDE 40 MG/ML IJ SUSP
2.0000 mL | INTRAMUSCULAR | Status: AC | PRN
  Administered 2023-11-09: 2 mL via INTRA_ARTICULAR

## 2023-11-09 MED ORDER — LIDOCAINE HCL 1 % IJ SOLN
4.0000 mL | INTRAMUSCULAR | Status: AC | PRN
  Administered 2023-11-09: 4 mL

## 2023-11-09 NOTE — Progress Notes (Signed)
     HPI: Patient presents today for for follow-up from yesterday's visit with Dr. Steward Drone concerning his left shoulder.  Potential subacromial injection was discussed at this visit and he would like to proceed with this today.  Patient states that his shoulder continues to be bothersome with no significant changes.  Denies any fever or chills.   Physical Exam: Active range of motion of the left shoulder to 160 degrees forward flexion, external rotation of 45 degrees, and internal rotation to L4.  No overlying erythema or warmth.   Procedure Note  Patient: James Bird             Date of Birth: [birth year]           MRN: [id number]             Visit Date: 11/09/2023  Procedures: Visit Diagnoses:  1. Chronic left shoulder pain     Large Joint Inj: L subacromial bursa on 11/09/2023 4:14 PM Indications: pain Details: 22 G 1.5 in needle, posterior approach Medications: 4 mL lidocaine 1 %; 2 mL triamcinolone acetonide 40 MG/ML Outcome: tolerated well, no immediate complications Procedure, treatment alternatives, risks and benefits explained, specific risks discussed. Consent was given by the patient. Immediately prior to procedure a time out was called to verify the correct patient, procedure, equipment, support staff and site/side marked as required. Patient was prepped and draped in the usual sterile fashion.      Plan: Return to clinic as needed      Hazle Nordmann, PA-C Orthopedics

## 2023-11-09 NOTE — Telephone Encounter (Signed)
 Mri ordered

## 2023-11-13 ENCOUNTER — Ambulatory Visit (HOSPITAL_BASED_OUTPATIENT_CLINIC_OR_DEPARTMENT_OTHER): Payer: Self-pay | Admitting: Orthopaedic Surgery

## 2023-12-13 ENCOUNTER — Encounter: Payer: Self-pay | Admitting: Internal Medicine

## 2023-12-13 ENCOUNTER — Ambulatory Visit: Attending: Internal Medicine | Admitting: Internal Medicine

## 2023-12-13 VITALS — BP 114/68 | HR 70 | Ht 70.0 in | Wt 226.8 lb

## 2023-12-13 DIAGNOSIS — Z8249 Family history of ischemic heart disease and other diseases of the circulatory system: Secondary | ICD-10-CM

## 2023-12-13 DIAGNOSIS — I10 Essential (primary) hypertension: Secondary | ICD-10-CM

## 2023-12-13 DIAGNOSIS — I2510 Atherosclerotic heart disease of native coronary artery without angina pectoris: Secondary | ICD-10-CM

## 2023-12-13 DIAGNOSIS — I700 Atherosclerosis of aorta: Secondary | ICD-10-CM

## 2023-12-13 NOTE — Patient Instructions (Signed)
 Medication Instructions:   *If you need a refill on your cardiac medications before your next appointment, please call your pharmacy*  Lab Work: Fasting nmr, apo b, lipo a, uric acid at any [facility] If you have labs (blood work) drawn today and your tests are completely normal, you will receive your results only by: MyChart Message (if you have MyChart) OR A paper copy in the mail If you have any lab test that is abnormal or we need to change your treatment, we will call you to review the results.  Testing/Procedures:     Please report to Radiology at the [facility] Main Entrance 30 minutes early for your test.  [address] [place], [place] [address]                         OR   Please report to Radiology at [facility] Main Entrance, medical mall, 30 mins prior to your test.  [address]  [place], [place]  How to Prepare for Your Cardiac PET/CT Stress Test:  Nothing to eat or drink, except water, 3 hours prior to arrival time.  NO caffeine/decaffeinated products, or chocolate 12 hours prior to arrival. (Please note decaffeinated beverages (teas/coffees) still contain caffeine).  If you have caffeine within 12 hours prior, the test will need to be rescheduled.  Medication instructions: Do not take erectile dysfunction medications for 72 hours prior to test (sildenafil , tadalafil )  You may take your remaining medications with water.  NO perfume, cologne or lotion on chest or abdomen area.  Total time is 1 to 2 hours; you may want to bring reading material for the waiting time.  In preparation for your appointment, medication and supplies will be purchased.  Appointment availability is limited, so if you need to cancel or reschedule, please call the Radiology Department Scheduler at [phone] 24 hours in advance to avoid a cancellation fee of $100.00  What to Expect When you Arrive:  Once you arrive and check in for your  appointment, you will be taken to a preparation room within the Radiology Department.  A technologist or Nurse will obtain your medical history, verify that you are correctly prepped for the exam, and explain the procedure.  Afterwards, an IV will be started in your arm and electrodes will be placed on your skin for EKG monitoring during the stress portion of the exam. Then you will be escorted to the PET/CT scanner.  There, staff will get you positioned on the scanner and obtain a blood pressure and EKG.  During the exam, you will continue to be connected to the EKG and blood pressure machines.  A small, safe amount of a radioactive tracer will be injected in your IV to obtain a series of pictures of your heart along with an injection of a stress agent.    After your Exam:  It is recommended that you eat a meal and drink a caffeinated beverage to counter act any effects of the stress agent.  Drink plenty of fluids for the remainder of the day and urinate frequently for the first couple of hours after the exam.  Your doctor will inform you of your test results within 7-10 business days.  For more information and frequently asked questions, please visit our website: [email or web address]  For questions about your test or how to prepare for your test, please call: Cardiac Imaging Nurse Navigators Office: [phone]   Follow-Up: will plan after  PET CT SCAN  At [facility], you and your health needs are our priority.  As part of our continuing mission to provide you with exceptional heart care, our providers are all part of one team.  This team includes your primary Cardiologist (physician) and Advanced Practice Providers or APPs (Physician Assistants and Nurse Practitioners) who all work together to provide you with the care you need, when you need it.  Your next appointment:   We recommend signing up for the patient portal called "MyChart".  Sign up information is provided on  this After Visit Summary.  MyChart is used to connect with patients for Virtual Visits (Telemedicine).  Patients are able to view lab/test results, encounter notes, upcoming appointments, etc.  Non-urgent messages can be sent to your provider as well.   To learn more about what you can do with MyChart, go to [email or web address].   Other Instructions

## 2023-12-13 NOTE — Progress Notes (Addendum)
 `   Cardiology Office Note   Date:  12/13/2023   ID:  Mudasir, Lanni 03/09/1961, MRN [id number]  PCP:  Claudene Crystal, PA-C  Cardiologist:   Ola Berger, MD   Pt presents for assessment of coronary calcifications    History of Present Illness: James Bird is a 63 y.o. male who I saw back in 2019 for cardiac risk assessment    Had atypical pain at time in shoulder   No CP   Note FHx of CAD    .        The pt recently had a chest CT for lung cancer screening     Found to have coronary calcifications if L and R coronary arteries  Talking to the pt he denies CP   He is active, plays pickleball (5 games)   Notes no severe SOB or change in his ability to play       Denies palpitations     Diet:  Breakfast: 2x /wk will have sausage, eggs. Lunch  May skip or have sandwich or roast chicken Marine scientist and veggies  Drink  Water, Unsweet tea Has stopped drinking EtOH  Since then has had more fruit (Grapes, Bnanas, oranges)   Current Meds  Medication Sig   aspirin  EC 81 MG tablet Take 1 tablet (81 mg total) by mouth 2 (two) times daily. To  be taken after surgery to prevent blood clots   atorvastatin  (LIPITOR) 20 MG tablet Take 1 tablet (20 mg total) by mouth daily.   doxylamine, Sleep, (UNISOM) 25 MG tablet Take 12.5 mg by mouth at bedtime as needed for sleep.   ibuprofen  (ADVIL ) 600 MG tablet Take 1 tablet (600 mg total) by mouth every 8 (eight) hours as needed.   linaclotide  (LINZESS ) 145 MCG CAPS capsule Take 1 capsule (145 mcg total) by mouth daily before breakfast.   lisinopril -hydrochlorothiazide  (ZESTORETIC ) 20-12.5 MG tablet Take 1 tablet by mouth daily.     Allergies:   Patient has no known allergies.   Past Medical History:  Diagnosis Date   Allergy    RHINITS   Chronic shoulder pain    left   Constipation 2019   Diverticulosis    ED (erectile dysfunction)    Former smoker    quit late 2018   H/O echocardiogram 03/20/08   mild - moderate LVH, EF 50-55%,  mild RV enlargement, mild mitral regurgitation, Dr. 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 Ardyce Bee   History of neck surgery    Hyperlipidemia    Hypertension    Impaired fasting glucose     Past Surgical History:  Procedure Laterality Date   COLONOSCOPY  10/2010   diverticulosis, othewrise normal, repeat 10 years, Dr. Elvin Hammer   SPINE SURGERY  2011(JENKINS,MD)   CERVICAL DISC AND FUSION   TOTAL KNEE ARTHROPLASTY Left 01/23/2023   Procedure: LEFT TOTAL KNEE ARTHROPLASTY;  Surgeon: Wes Hamman, MD;  Location: [facility] OR;  Service: Orthopedics;  Laterality: Left;     Social History:  The patient  reports that he quit smoking about 6 years ago. His smoking use included cigarettes. He started smoking about 47 years ago. He has a 25.8 pack-year smoking history. He has never used smokeless tobacco. He reports current alcohol use of about 10.0 standard drinks of alcohol per week. He reports that  he does not currently use drugs.   Family History:  The patient's family history includes Cancer in his mother, paternal aunt, and paternal uncle; Heart disease (age of onset: 19) in his father; Hypertension in his brother.    ROS:  Please see the history of present illness. All other systems are reviewed and  Negative to the above problem except as noted.    PHYSICAL EXAM: VS:  BP 114/68 (BP Location: Left Arm, Patient Position: Sitting)   Pulse 70   Ht 5\' 10"  (1.778 m)   Wt 226 lb 12.8 oz (102.9 kg)   SpO2 97%   BMI 32.54 kg/m   YQI:HKVQQ 63 yo  in no acute distress  HEENT: normal  Neck: no JVD, carotid bruits Cardiac: RRR; no murmurs,  No LE edema  Respiratory:  clear to auscultation  GI: soft, nontender, no masses  No hepatomegaly   EKG:  EKG shows NSR 70 bpm      Lipid Panel    Component Value Date/Time   CHOL 175 10/30/2023 1108   TRIG 82 10/30/2023 1108   HDL 47 10/30/2023  1108   CHOLHDL 3.7 10/30/2023 1108   CHOLHDL 2.9 03/29/2017 0919   VLDL 20 03/29/2017 0919   LDLCALC 113 (H) 10/30/2023 1108      Wt Readings from Last 3 Encounters:  12/13/23 226 lb 12.8 oz (102.9 kg)  10/30/23 236 lb (107 kg)  09/08/23 239 lb (108.4 kg)      ASSESSMENT AND PLAN:  1  CAD  Pt with CAD documeneted on CT of chest   He is very active with no symptoms   Concerned however about degree of calcifications Given this would recomm Lexiscan PET/CT   Discussed limitations of noninvasive tests   Will proceed  2   HTN   BP is well controlled on current regimen   3  HL  Pt has been on statin since before I saw him in 2019   LDL in March was 113    I would recomm lipomed   4   Metabolics   A1C 5.9 in March   Since then he has given up Etoh but admits to eating more fruit   Will check A1C    Reviewed low carb diet     Pt given links for  bobby approved as well as Clovia Dapper on LEVELS  Follow up based in findings     Current medicines are reviewed at length with the patient today.  The patient does not have concerns regarding medicines.  Signed, Ola Berger, MD  12/13/2023 3:01 PM    [facility] [address] [place], [place], [place]  [address] Phone: [phone]; Fax: [phone]

## 2023-12-16 LAB — URIC ACID: Uric Acid: 7 mg/dL (ref 3.8–8.4)

## 2023-12-16 LAB — NMR, LIPOPROFILE
Cholesterol, Total: 157 mg/dL (ref 100–199)
HDL Particle Number: 32 umol/L (ref >=30.5)
HDL-C: 54 mg/dL (ref >39)
LDL Particle Number: 1111 nmol/L — ABNORMAL HIGH (ref <1000)
LDL Size: 20.6 nm (ref >20.5)
LDL-C (NIH Calc): 89 mg/dL (ref 0–99)
LP-IR Score: 33 (ref <=45)
Small LDL Particle Number: 500 nmol/L (ref <=527)
Triglycerides: 72 mg/dL (ref 0–149)

## 2023-12-16 LAB — LIPOPROTEIN A (LPA): Lipoprotein (a): 70.8 nmol/L (ref <75.0)

## 2023-12-16 LAB — APOLIPOPROTEIN B: Apolipoprotein B: 76 mg/dL (ref <90)

## 2023-12-25 ENCOUNTER — Other Ambulatory Visit: Payer: Self-pay

## 2023-12-25 MED ORDER — EZETIMIBE 10 MG PO TABS: 10.0000 mg | ORAL_TABLET | Freq: Every day | ORAL | 3 refills | Status: AC

## 2023-12-26 ENCOUNTER — Telehealth (HOSPITAL_COMMUNITY): Payer: Self-pay | Admitting: *Deleted

## 2023-12-26 NOTE — Telephone Encounter (Signed)

## 2023-12-27 ENCOUNTER — Ambulatory Visit (HOSPITAL_COMMUNITY)
Admission: RE | Admit: 2023-12-27 | Discharge: 2023-12-27 | Disposition: A | Discharge status: Home / Self Care | Source: Ambulatory Visit | Attending: Internal Medicine | Admitting: Internal Medicine

## 2023-12-27 DIAGNOSIS — I700 Atherosclerosis of aorta: Secondary | ICD-10-CM | POA: Diagnosis not present

## 2023-12-27 DIAGNOSIS — Z8249 Family history of ischemic heart disease and other diseases of the circulatory system: Secondary | ICD-10-CM | POA: Diagnosis not present

## 2023-12-27 DIAGNOSIS — I10 Essential (primary) hypertension: Secondary | ICD-10-CM | POA: Diagnosis present

## 2023-12-27 DIAGNOSIS — I2510 Atherosclerotic heart disease of native coronary artery without angina pectoris: Secondary | ICD-10-CM | POA: Diagnosis present

## 2023-12-27 MED ORDER — REGADENOSON 0.4 MG/5ML IV SOLN
0.4000 mg | Freq: Once | INTRAVENOUS | Status: AC
  Administered 2023-12-27: 0.4 mg via INTRAVENOUS

## 2023-12-27 MED ORDER — RUBIDIUM RB82 GENERATOR (RUBYFILL)
26.5500 | PACK | Freq: Once | INTRAVENOUS | Status: AC
  Administered 2023-12-27: 26.55 via INTRAVENOUS

## 2023-12-27 MED ORDER — RUBIDIUM RB82 GENERATOR (RUBYFILL)
26.3200 | PACK | Freq: Once | INTRAVENOUS | Status: AC
  Administered 2023-12-27: 26.32 via INTRAVENOUS

## 2023-12-27 MED ORDER — REGADENOSON 0.4 MG/5ML IV SOLN
INTRAVENOUS | Status: AC
  Filled 2023-12-27: qty 5

## 2023-12-28 LAB — NM PET CT CARDIAC PERFUSION MULTI W/ABSOLUTE BLOODFLOW
LV dias vol: 125 mL (ref 62–150)
LV sys vol: 51 mL
MBFR: 2.69
Nuc Rest EF: 55 %
Nuc Stress EF: 59 %
Peak HR: 91 {beats}/min
Rest HR: 62 {beats}/min
Rest MBF: 0.61 ml/g/min
Rest Nuclear Isotope Dose: 26.6 mCi
ST Depression (mm): 0 mm
Stress MBF: 1.64 ml/g/min
Stress Nuclear Isotope Dose: 26.3 mCi
TID: 1

## 2023-12-29 ENCOUNTER — Ambulatory Visit: Payer: Self-pay | Admitting: Internal Medicine

## 2024-01-09 ENCOUNTER — Encounter: Payer: Self-pay | Admitting: Orthopaedic Surgery

## 2024-01-09 ENCOUNTER — Ambulatory Visit: Payer: BC Managed Care – PPO | Admitting: Orthopaedic Surgery

## 2024-01-09 ENCOUNTER — Other Ambulatory Visit (INDEPENDENT_AMBULATORY_CARE_PROVIDER_SITE_OTHER): Payer: Self-pay

## 2024-01-09 DIAGNOSIS — Z96652 Presence of left artificial knee joint: Secondary | ICD-10-CM

## 2024-01-09 NOTE — Progress Notes (Signed)
 Patient: James Bird           Date of Birth: 15-Jul-1961           MRN: [id number] Visit Date: 01/09/2024 PCP: Claudene Crystal, PA-C   Assessment & Plan:  Chief Complaint:  Chief Complaint  Patient presents with   Left Knee - Follow-up    Left total knee arthroplasty 01/23/2023   Visit Diagnoses:  1. Status post total left knee replacement     Plan: History of Present Illness James Bird is a 63 year old male with a history of knee replacement surgery who presents with knee and groin pain.  He experiences knee pain for the past week, with increasing intensity over the last couple of days, rated as four out of ten, and most severe yesterday. The pain is located in the front center of the knee with a tingling sensation radiating down the leg. No swelling is observed.  He recently increased his physical activity by playing pickleball three times a week, starting around mid to late January. The pain began after increasing his frequency of play last week. He is not taking any medication for the knee pain.  Physical Exam MUSCULOSKELETAL: Small effusion present in left knee. Full extension and flexion to 120 degrees.  Ligaments intact.  Results RADIOLOGY Knee X-ray: Implant appears intact, no signs of loosening.  Assessment and Plan Knee pain with inflammation post-replacement Pain and inflammation likely due to increased activity. X-rays show intact implant with no loosening. Inflammation not damaging the knee replacement. Improvement expected with reduced activity and NSAIDs. - Advise reducing activity to allow inflammation to subside. - Recommend ibuprofen  or naproxen  for inflammation reduction. - Reassure implant is intact with no damage. - Advise monitoring symptoms and returning if changes or worsening occur. - Encourage balancing activity levels to manage symptoms.  Follow-Up Instructions: Return in about 1 year (around 01/08/2025).   Orders:  Orders Placed This  Encounter  Procedures   XR Knee 1-2 Views Left   No orders of the defined types were placed in this encounter.   Imaging: XR Knee 1-2 Views Left Result Date: 01/09/2024 X-rays of the left knee show a stable left total knee replacement in good alignment.    PMFS History: Patient Active Problem List   Diagnosis Date Noted   Encounter for health maintenance examination in adult 10/30/2023   Aortic atherosclerosis (HCC) 10/30/2023   Coronary artery calcification seen on CT scan 10/30/2023   Status post total left knee replacement 01/23/2023   History of smoking 10-25 pack years 08/24/2021   History of colonic polyps 08/24/2021   H/O cervical discectomy 08/24/2021   Constipation 04/16/2020   Family history of heart disease 02/21/2019   Paresthesia 05/24/2018   Elevated PSA 05/24/2018   Nail deformity 05/24/2018   Onychomycosis 05/24/2018   Tinea pedis 05/24/2018   Lumbar radiculopathy 04/25/2018   Primary osteoarthritis of left knee 03/29/2017   Clonus 03/29/2017   Right foot drop 03/29/2017   Routine general medical examination at a health care facility 12/14/2015   Obesity 12/11/2014   Hyperlipidemia 11/28/2012   Erectile dysfunction 11/28/2012   Essential hypertension, benign 11/02/2011   Impaired fasting blood sugar 11/02/2011   Past Medical History:  Diagnosis Date   Allergy    RHINITS   Chronic shoulder pain    left   Constipation 2019   Diverticulosis    ED (erectile dysfunction)    Former smoker    quit late 2018  H/O echocardiogram 03/20/08   mild - moderate LVH, EF 50-55%, mild RV enlargement, mild mitral regurgitation, Dr. 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 Ardyce Bee   History of neck surgery    Hyperlipidemia    Hypertension    Impaired fasting glucose     Family History  Problem Relation Age of Onset   Cancer Mother        LUNG   Heart  disease Father 48       MI   Cancer Paternal Aunt        type unknown   Cancer Paternal Uncle        type unknown   Hypertension Brother    Diabetes Neg Hx    Stroke Neg Hx    Colon cancer Neg Hx    Esophageal cancer Neg Hx    Pancreatic cancer Neg Hx    Stomach cancer Neg Hx    Rectal cancer Neg Hx     Past Surgical History:  Procedure Laterality Date   COLONOSCOPY  10/2010   diverticulosis, othewrise normal, repeat 10 years, Dr. Elvin Hammer   SPINE SURGERY  2011(JENKINS,MD)   CERVICAL DISC AND FUSION   TOTAL KNEE ARTHROPLASTY Left 01/23/2023   Procedure: LEFT TOTAL KNEE ARTHROPLASTY;  Surgeon: Wes Hamman, MD;  Location: [facility] OR;  Service: Orthopedics;  Laterality: Left;   Social History   Occupational History   Occupation: Therapist, nutritional: [employer]  Tobacco Use   Smoking status: Former    Current packs/day: 0.00    Average packs/day: 0.6 packs/day for 41.0 years (25.8 ttl pk-yrs)    Types: Cigarettes    Start date: 05/15/1976    Quit date: 05/15/2017    Years since quitting: 6.6   Smokeless tobacco: Never   Tobacco comments:    Stopped Feb 2024  Vaping Use   Vaping status: Never Used  Substance and Sexual Activity   Alcohol use: Yes    Alcohol/week: 10.0 standard drinks of alcohol    Types: 10 Cans of beer per week    Comment: 10 drinks per week, sometimes more   Drug use: Not Currently    Comment: 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

## 2024-01-22 ENCOUNTER — Other Ambulatory Visit: Payer: Self-pay | Admitting: Medical

## 2024-01-30 ENCOUNTER — Encounter: Payer: Self-pay | Admitting: Orthopaedic Surgery

## 2024-02-06 ENCOUNTER — Ambulatory Visit: Admitting: Orthopaedic Surgery

## 2024-02-06 DIAGNOSIS — Z96652 Presence of left artificial knee joint: Secondary | ICD-10-CM

## 2024-02-06 NOTE — Progress Notes (Signed)
 Patient: James Bird           Date of Birth: [birth year]           MRN: [id number] Visit Date: 02/06/2024 PCP: Bulah Alm RAMAN, PA-C   Assessment & Plan:  Chief Complaint:  Chief Complaint  Patient presents with   Left Knee - Follow-up, Pain   Visit Diagnoses:  1. Status post total left knee replacement     Plan: History of Present Illness James Bird is a 63 year old male with a history of left knee replacement who presents with increased left knee pain.  He is one year post left knee replacement and experiences increased pain in the knee, which has become more pronounced recently. The pain is activity-related, sometimes feeling like post-exercise soreness, but currently remains constant. Occasional instability occurs, particularly after physical activity, with episodes of knee 'buckling' that resolve without sustained instability. He does not use a knee brace during activities. Recent x-rays were performed a couple of weeks ago, but the pain persists.  Just recently had x-rays of the left knee which showed a stable total knee arthroplasty.  Physical Exam MEASUREMENTS: BMI- 32.0. MUSCULOSKELETAL: Fluid present in left knee. Normal range of motion in left knee. Crepitus present in left knee. Ligaments intact in left knee.  Results LABS BMI: 32 (02/06/2024)  Assessment and Plan 1 year status post left total knee arthroplasty with recent pain due to change in activity. Post-surgical knee pain and inflammation in the left knee due to activity-related inflammation. Fluid is inflammatory, expected to resolve with decreased activity and anti-inflammatory measures. - Provide hinged knee brace for activities such as pickleball. - Advise anti-inflammatory medication as needed. - Recommend activity modification based on pain. - Educate on natural course of post-surgical knee inflammation.  Overweight BMI 32, classified as overweight. Continued weight management advised  to reduce knee strain and improve health. - Encourage continued weight loss for healthier BMI. - Discuss benefits of weight loss on knee strain and health.  Follow-Up Instructions: Return in about 1 year (around 02/05/2025).   PMFS History: Patient Active Problem List   Diagnosis Date Noted   Encounter for health maintenance examination in adult 10/30/2023   Aortic atherosclerosis (HCC) 10/30/2023   Coronary artery calcification seen on CT scan 10/30/2023   Status post total left knee replacement 01/23/2023   History of smoking 10-25 pack years 08/24/2021   History of colonic polyps 08/24/2021   H/O cervical discectomy 08/24/2021   Constipation 04/16/2020   Family history of heart disease 02/21/2019   Paresthesia 05/24/2018   Elevated PSA 05/24/2018   Nail deformity 05/24/2018   Onychomycosis 05/24/2018   Tinea pedis 05/24/2018   Lumbar radiculopathy 04/25/2018   Primary osteoarthritis of left knee 03/29/2017   Clonus 03/29/2017   Right foot drop 03/29/2017   Routine general medical examination at a health care facility 12/14/2015   Obesity 12/11/2014   Hyperlipidemia 11/28/2012   Erectile dysfunction 11/28/2012   Essential hypertension, benign 11/02/2011   Impaired fasting blood sugar 11/02/2011   Past Medical History:  Diagnosis Date   Allergy    RHINITS   Chronic shoulder pain    left   Constipation 2019   Diverticulosis    ED (erectile dysfunction)    Former smoker    quit late 2018   H/O echocardiogram 03/20/08   mild - moderate LVH, EF 50-55%, mild RV enlargement, mild mitral regurgitation, Dr. 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 Ellin   History of neck surgery    Hyperlipidemia    Hypertension    Impaired fasting glucose     Family History  Problem Relation Age of Onset   Cancer Mother        LUNG   Heart disease Father 27       MI   Cancer  Paternal Aunt        type unknown   Cancer Paternal Uncle        type unknown   Hypertension Brother    Diabetes Neg Hx    Stroke Neg Hx    Colon cancer Neg Hx    Esophageal cancer Neg Hx    Pancreatic cancer Neg Hx    Stomach cancer Neg Hx    Rectal cancer Neg Hx     Past Surgical History:  Procedure Laterality Date   COLONOSCOPY  10/2010   diverticulosis, othewrise normal, repeat 10 years, Dr. Abran   SPINE SURGERY  2011(JENKINS,MD)   CERVICAL DISC AND FUSION   TOTAL KNEE ARTHROPLASTY Left 01/23/2023   Procedure: LEFT TOTAL KNEE ARTHROPLASTY;  Surgeon: Jerri Kay HERO, MD;  Location: [facility] OR;  Service: Orthopedics;  Laterality: Left;   Social History   Occupational History   Occupation: Therapist, nutritional: [employer]  Tobacco Use   Smoking status: Former    Current packs/day: 0.00    Average packs/day: 0.6 packs/day for 41.0 years (25.8 ttl pk-yrs)    Types: Cigarettes    Start date: 05/15/1976    Quit date: 05/15/2017    Years since quitting: 6.7   Smokeless tobacco: Never   Tobacco comments:    Stopped Feb 2024  Vaping Use   Vaping status: Never Used  Substance and Sexual Activity   Alcohol use: Yes    Alcohol/week: 10.0 standard drinks of alcohol    Types: 10 Cans of beer per week    Comment: 10 drinks per week, sometimes more   Drug use: Not Currently    Comment: 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

## 2024-02-14 ENCOUNTER — Other Ambulatory Visit: Payer: Self-pay | Admitting: Acute Care

## 2024-02-14 DIAGNOSIS — Z87891 Personal history of nicotine dependence: Secondary | ICD-10-CM

## 2024-02-21 ENCOUNTER — Encounter: Payer: Self-pay | Admitting: Acute Care

## 2024-02-21 NOTE — Progress Notes (Unsigned)
 `   Cardiology Office Note   Date:  02/21/2024   ID:  James Bird, DOB Aug 31, 1960, MRN [id number]  PCP:  Bulah Alm RAMAN, PA-C  Cardiologist:   Vina Gull, MD   Pt presents for assessment of coronary calcifications    History of Present Illness: James Bird is a 63 y.o. male who I saw back in 2019 for cardiac risk assessment    Had atypical pain at time in shoulder   No CP   Note FHx of CAD    .        The pt recently had a chest CT for lung cancer screening     Found to have coronary calcifications if L and R coronary arteries  Talking to the pt he denies CP   He is active, plays pickleball (5 games)   Notes no severe SOB or change in his ability to play       Denies palpitations     Diet:  Breakfast: 2x /wk will have sausage, eggs. Lunch  May skip or have sandwich or roast chicken Marine scientist and veggies  Drink  Water, Unsweet tea Has stopped drinking EtOH  Since then has had more fruit (Grapes, Bnanas, oranges)  I saw athe pt in April 2025  No outpatient medications have been marked as taking for the 02/22/24 encounter (Appointment) with Gull Vina GAILS, MD.     Allergies:   Patient has no known allergies.   Past Medical History:  Diagnosis Date   Allergy    RHINITS   Chronic shoulder pain    left   Constipation 2019   Diverticulosis    ED (erectile dysfunction)    Former smoker    quit late 2018   H/O echocardiogram 03/20/08   mild - moderate LVH, EF 50-55%, mild RV enlargement, mild mitral regurgitation, Dr. Ellin   Herpes simplex    +HSV IgG for type 1 and 2   History of cardiovascular stress test 03/20/2008   dobutamine stress test, normal study, Dr. Ellin   History of neck surgery    Hyperlipidemia    Hypertension    Impaired fasting glucose     Past Surgical History:  Procedure Laterality Date   COLONOSCOPY  10/2010   diverticulosis, othewrise normal, repeat 10 years, Dr. Abran   SPINE SURGERY  2011(JENKINS,MD)   CERVICAL DISC AND FUSION    TOTAL KNEE ARTHROPLASTY Left 01/23/2023   Procedure: LEFT TOTAL KNEE ARTHROPLASTY;  Surgeon: Jerri Kay HERO, MD;  Location: [facility] OR;  Service: Orthopedics;  Laterality: Left;     Social History:  The patient  reports that he quit smoking about 6 years ago. His smoking use included cigarettes. He started smoking about 47 years ago. He has a 25.8 pack-year smoking history. He has never used smokeless tobacco. He reports current alcohol use of about 10.0 standard drinks of alcohol per week. He reports that he does not currently use drugs.   Family History:  The patient's family history includes Cancer in his mother, paternal aunt, and paternal uncle; Heart disease (age of onset: 79) in his father; Hypertension in his brother.    ROS:  Please see the history of present illness. All other systems are reviewed and  Negative to the above problem except as noted.    PHYSICAL EXAM: VS:  There were no vitals taken for this visit.  HZW:Nazdz 63 yo  in no acute distress  HEENT: normal  Neck: no JVD, carotid  bruits Cardiac: RRR; no murmurs,  No LE edema  Respiratory:  clear to auscultation  GI: soft, nontender, no masses  No hepatomegaly   EKG:  EKG shows NSR 70 bpm      Lipid Panel    Component Value Date/Time   CHOL 175 10/30/2023 1108   TRIG 82 10/30/2023 1108   HDL 47 10/30/2023 1108   CHOLHDL 3.7 10/30/2023 1108   CHOLHDL 2.9 03/29/2017 0919   VLDL 20 03/29/2017 0919   LDLCALC 113 (H) 10/30/2023 1108      Wt Readings from Last 3 Encounters:  12/13/23 226 lb 12.8 oz (102.9 kg)  10/30/23 236 lb (107 kg)  09/08/23 239 lb (108.4 kg)      ASSESSMENT AND PLAN:  1  CAD  Pt with CAD documeneted on CT of chest   He is very active with no symptoms   Concerned however about degree of calcifications Given this would recomm Lexiscan  PET/CT   Discussed limitations of noninvasive tests   Will proceed  2   HTN   BP is well controlled on current regimen   3  HL  Pt has been on statin since  before I saw him in 2019   LDL in March was 113    I would recomm lipomed   4   Metabolics   A1C 5.9 in March   Since then he has given up Etoh but admits to eating more fruit   Will check A1C    Reviewed low carb diet     Pt given links for  bobby approved as well as [facility] Register on LEVELS  Follow up based in findings     Current medicines are reviewed at length with the patient today.  The patient does not have concerns regarding medicines.  Signed, Vina Gull, MD  02/21/2024 10:13 AM    [facility] [address] [place], [place], [place]  [address] Phone: [phone]; Fax: [phone]

## 2024-02-22 ENCOUNTER — Ambulatory Visit: Payer: Self-pay | Admitting: Internal Medicine

## 2024-02-22 ENCOUNTER — Telehealth: Payer: Self-pay | Admitting: Internal Medicine

## 2024-02-22 ENCOUNTER — Encounter: Payer: Self-pay | Admitting: Internal Medicine

## 2024-02-22 ENCOUNTER — Other Ambulatory Visit: Payer: Self-pay | Admitting: *Deleted

## 2024-02-22 VITALS — BP 124/78 | HR 72 | Ht 70.0 in | Wt 220.0 lb

## 2024-02-22 DIAGNOSIS — Z79899 Other long term (current) drug therapy: Secondary | ICD-10-CM

## 2024-02-22 DIAGNOSIS — I2510 Atherosclerotic heart disease of native coronary artery without angina pectoris: Secondary | ICD-10-CM

## 2024-02-22 DIAGNOSIS — E785 Hyperlipidemia, unspecified: Secondary | ICD-10-CM

## 2024-02-22 NOTE — Patient Instructions (Signed)
 Medication Instructions:  The current medical regimen is effective;  continue present plan and medications.  *If you need a refill on your cardiac medications before your next appointment, please call your pharmacy*  Lab Work: Please return for fasting blood work.  If you have labs (blood work) drawn today and your tests are completely normal, you will receive your results only by: MyChart Message (if you have MyChart) OR A paper copy in the mail If you have any lab test that is abnormal or we need to change your treatment, we will call you to review the results.  Follow-Up: At [facility], you and your health needs are our priority.  As part of our continuing mission to provide you with exceptional heart care, our providers are all part of one team.  This team includes your primary Cardiologist (physician) and Advanced Practice Providers or APPs (Physician Assistants and Nurse Practitioners) who all work together to provide you with the care you need, when you need it.  Your next appointment:   9 month(s)  Provider:   Dr Okey     We recommend signing up for the patient portal called MyChart.  Sign up information is provided on this After Visit Summary.  MyChart is used to connect with patients for Virtual Visits (Telemedicine).  Patients are able to view lab/test results, encounter notes, upcoming appointments, etc.  Non-urgent messages can be sent to your provider as well.   To learn more about what you can do with MyChart, go to [email or web address].

## 2024-02-22 NOTE — Telephone Encounter (Signed)
 Noted! Thank you

## 2024-02-22 NOTE — Telephone Encounter (Signed)
 Left vm for pt informing him that I have refunded his $80 copayment, per Dr. Nada request. A copy of the receipt will be mailed to address on file.  JB, 02-22-24

## 2024-02-26 ENCOUNTER — Ambulatory Visit
Admission: RE | Admit: 2024-02-26 | Discharge: 2024-02-26 | Disposition: A | Discharge status: Home / Self Care | Source: Ambulatory Visit | Attending: Medical | Admitting: Medical

## 2024-02-26 DIAGNOSIS — Z87891 Personal history of nicotine dependence: Secondary | ICD-10-CM

## 2024-03-04 ENCOUNTER — Other Ambulatory Visit: Payer: Self-pay | Admitting: Family Medicine

## 2024-03-05 ENCOUNTER — Other Ambulatory Visit: Payer: Self-pay | Admitting: Acute Care

## 2024-03-05 DIAGNOSIS — Z122 Encounter for screening for malignant neoplasm of respiratory organs: Secondary | ICD-10-CM

## 2024-03-05 DIAGNOSIS — Z87891 Personal history of nicotine dependence: Secondary | ICD-10-CM

## 2024-03-06 ENCOUNTER — Other Ambulatory Visit (HOSPITAL_COMMUNITY)

## 2024-04-08 ENCOUNTER — Encounter: Payer: Self-pay | Admitting: Family Medicine

## 2024-04-08 ENCOUNTER — Ambulatory Visit: Admitting: Family Medicine

## 2024-04-08 VITALS — BP 126/80 | HR 72 | Wt 227.2 lb

## 2024-04-08 DIAGNOSIS — W57XXXA Bitten or stung by nonvenomous insect and other nonvenomous arthropods, initial encounter: Secondary | ICD-10-CM

## 2024-04-08 DIAGNOSIS — S60562A Insect bite (nonvenomous) of left hand, initial encounter: Secondary | ICD-10-CM

## 2024-04-08 MED ORDER — HYDROCORTISONE 2 % EX CREA: 1.0000 | TOPICAL_CREAM | Freq: Two times a day (BID) | CUTANEOUS | 0 refills | Status: DC

## 2024-04-08 NOTE — Progress Notes (Signed)
   Name: James Bird   Date of Visit: 04/08/24   Date of last visit with me: Visit date not found   CHIEF COMPLAINT:  Chief Complaint  Patient presents with   Acute Visit    Left hand swollen. Got stung by something, starting to itch.        HPI:  Discussed the use of AI scribe software for clinical note transcription with the patient, who gave verbal consent to proceed.  History of Present Illness   James Bird is a 63 year old male who presents with swelling and itching of the hand following a sting.  He was stung on Saturday while doing yard work and is unsure of the exact cause, suspecting it might have been a bee or a spider. Initially, the sting was painful for a couple of days, but the pain has subsided. Currently, he experiences significant itching and noticeable swelling at the site of the sting, but no tenderness.  He has not taken any medications such as Benadryl for the symptoms, as he thought they would resolve on their own.  He mentions a history of allergy to poison ivy, but he does not believe this is the cause of his current symptoms, as he has had poison ivy before and recognizes the signs.         OBJECTIVE:       10/30/2023    9:42 AM  Depression screen PHQ 2/9  Decreased Interest 0  Down, Depressed, Hopeless 0  PHQ - 2 Score 0     BP Readings from Last 3 Encounters:  04/08/24 126/80  02/22/24 124/78  12/27/23 (!) 104/56    BP 126/80   Pulse 72   Wt 227 lb 3.2 oz (103.1 kg)   SpO2 98%   BMI 32.60 kg/m    Physical Exam   EXTREMITIES: Swelling present on hand dorsal side near thumb. SKIN: Redness present on skin, no signs infection at this time as rash appears to be more allergic in nature.  There is no tenderness to palpation over the rash however there is some noted itchiness.     Physical Exam  ASSESSMENT/PLAN:   Assessment & Plan Bug bite, initial encounter    Assessment and Plan    Allergic reaction to insect sting,  right hand Allergic reaction with swelling and itching, no tenderness. Redness not indicative of bacterial infection. Poison ivy less likely. - Prescribed topical 2% hydrocortisone  twice daily. - Recommended OTC Benadryl for swelling. - Instructed to contact via MyChart if redness becomes tender for possible antibiotics.         Dorotha Hirschi A. Vita MD [facility]

## 2024-06-17 ENCOUNTER — Encounter: Payer: Self-pay | Admitting: Radiology

## 2024-07-05 ENCOUNTER — Ambulatory Visit (INDEPENDENT_AMBULATORY_CARE_PROVIDER_SITE_OTHER)

## 2024-07-05 DIAGNOSIS — Z23 Encounter for immunization: Secondary | ICD-10-CM | POA: Diagnosis not present

## 2024-07-05 NOTE — Progress Notes (Signed)
 Patient is in office today for a nurse visit for Immunization. Patient Injection was given in the  Left deltoid. Patient tolerated injection well.

## 2024-07-09 ENCOUNTER — Other Ambulatory Visit: Payer: Self-pay | Admitting: Medical

## 2024-07-09 NOTE — Telephone Encounter (Signed)
 Is this okay to refill?

## 2024-07-15 ENCOUNTER — Ambulatory Visit: Admitting: Medical

## 2024-07-15 VITALS — BP 124/80 | HR 79 | Wt 233.0 lb

## 2024-07-15 DIAGNOSIS — L989 Disorder of the skin and subcutaneous tissue, unspecified: Secondary | ICD-10-CM | POA: Diagnosis not present

## 2024-07-15 DIAGNOSIS — Z23 Encounter for immunization: Secondary | ICD-10-CM

## 2024-07-15 DIAGNOSIS — L819 Disorder of pigmentation, unspecified: Secondary | ICD-10-CM | POA: Diagnosis not present

## 2024-07-15 NOTE — Progress Notes (Signed)
 Subjective Chief Complaint  Patient presents with   Acute Visit    Has moles on face that he wants gone.    Abem D Paprocki is a 63 year old male who presents with concerns about facial moles and skin tags.  He has noticed multiple flat skin tags and moles on his face, which he believes may be hereditary as his siblings also have them. He has not seen a dermatologist recently for these concerns.  The moles and skin tags became more noticeable to him after seeing photographs taken at a recent banquet, where he felt they were more prominent. He describes them as mostly flat and not protruding significantly.  He recalls having a skin tag on his neck in the past, which was removed, but notes that the current facial lesions are flat and not similar to the previous one.  He mentions that one of the moles on his face has grown larger over time, which is a concern for him.  No other aggravating or relieving factors. No other complaint.  Past Medical History:  Diagnosis Date   Allergy    RHINITS   Chronic shoulder pain    left   Constipation 2019   Diverticulosis    ED (erectile dysfunction)    Former smoker    quit late 2018   H/O echocardiogram 03/20/08   mild - moderate LVH, EF 50-55%, mild RV enlargement, mild mitral regurgitation, Dr. Ellin   Herpes simplex    +HSV IgG for type 1 and 2   History of cardiovascular stress test 03/20/2008   dobutamine stress test, normal study, Dr. Ellin   History of neck surgery    Hyperlipidemia    Hypertension    Impaired fasting glucose    Current Outpatient Medications on File Prior to Visit  Medication Sig Dispense Refill   aspirin  EC 81 MG tablet Take 1 tablet (81 mg total) by mouth 2 (two) times daily. To  be taken after surgery to prevent blood clots (Patient taking differently: Take 81 mg by mouth daily. To  be taken after surgery to prevent blood clots) 90 tablet 3   atorvastatin  (LIPITOR) 20 MG tablet Take 1 tablet (20 mg total) by mouth  daily. 90 tablet 3   ezetimibe  (ZETIA ) 10 MG tablet Take 1 tablet (10 mg total) by mouth daily. 90 tablet 3   ibuprofen  (ADVIL ) 600 MG tablet TAKE 1 TABLET(600 MG) BY MOUTH EVERY 8 HOURS AS NEEDED 30 tablet 0   lisinopril -hydrochlorothiazide  (ZESTORETIC ) 20-12.5 MG tablet Take 1 tablet by mouth daily. 90 tablet 3   valACYclovir  (VALTREX ) 1000 MG tablet TAKE 1 TABLET(1000 MG) BY MOUTH TWICE DAILY 20 tablet 0   doxylamine, Sleep, (UNISOM) 25 MG tablet Take 12.5 mg by mouth at bedtime as needed for sleep. (Patient not taking: Reported on 07/15/2024)     Hydrocortisone  2 % CREA Apply 1 Pump topically in the morning and at bedtime. (Patient not taking: Reported on 07/15/2024) 28 g 0   linaclotide  (LINZESS ) 145 MCG CAPS capsule Take 1 capsule (145 mcg total) by mouth daily before breakfast. (Patient not taking: Reported on 07/15/2024) 30 capsule 2   No current facility-administered medications on file prior to visit.    Objective: BP 124/80   Pulse 79   Wt 233 lb (105.7 kg)   SpO2 97%   BMI 33.43 kg/m   Gen: wd, wn, nad Several brown roundish, mostly flat to slightly raised lesions of cheeks and forehead and temple region, mostly 2-3  mm diameter but some a little larger.            Assessment: Encounter Diagnoses  Name Primary?   Change in pigmented skin lesion of face Yes   Skin lesion of face    COVID-19 vaccine administered      Plan: Discussed his concerns.  I will reach out to plastic surgery to see if they would be willing to see him for possible treatment options.  We discussed skin tags versus other lesions versus things will be biopsied.  No obvious worrisome skin lesions suggestive of cancer.  Follow-up pending reply from plastic surgery  Counseled on the covid virus vaccine.   Covid vaccine given after consent obtained.  James Bird was seen today for acute visit.  Diagnoses and all orders for this visit:  Change in pigmented skin lesion of face  Skin lesion of  face  COVID-19 vaccine administered -     Pfizer Comirnaty Covid-19 Vaccine 36yrs & older    F/u pending call back

## 2024-07-15 NOTE — Addendum Note (Signed)
 Addended by: BULAH ALM RAMAN on: 07/15/2024 04:29 PM   Modules accepted: Orders

## 2024-07-15 NOTE — Progress Notes (Signed)
 Pt was notified.

## 2024-08-06 ENCOUNTER — Ambulatory Visit: Admitting: Family Medicine

## 2024-08-06 ENCOUNTER — Encounter: Payer: Self-pay | Admitting: Family Medicine

## 2024-08-06 VITALS — BP 130/78 | HR 67 | Ht 71.0 in | Wt 235.2 lb

## 2024-08-06 DIAGNOSIS — J209 Acute bronchitis, unspecified: Secondary | ICD-10-CM

## 2024-08-06 DIAGNOSIS — J019 Acute sinusitis, unspecified: Secondary | ICD-10-CM

## 2024-08-06 MED ORDER — AMOXICILLIN-POT CLAVULANATE 875-125 MG PO TABS: 1.0000 | ORAL_TABLET | Freq: Two times a day (BID) | ORAL | 0 refills | Status: AC

## 2024-08-06 NOTE — Progress Notes (Signed)
" ° °  Subjective:    Patient ID: James Bird, male    DOB: Jun 22, 1961, 63 y.o.   MRN: [id number]  Discussed the use of AI scribe software for clinical note transcription with the patient, who gave verbal consent to proceed.  History of Present Illness   James Bird is a 63 year old male who presents with persistent upper respiratory symptoms.  Symptoms began on July 27, 2024, with a sore throat and nasal congestion. Over the following days, he developed postnasal drip, frequent throat clearing, and a productive cough with phlegm.  The phlegm production has decreased compared to a week ago, but he continues to experience coughing, particularly in the morning. He denies shortness of breath or fever but describes a sensation of fullness in his head due to ongoing sinus congestion.  He experiences no sinus pressure, denies any upper teeth pain. He does not smoke and has no known allergies.           Review of Systems     Objective:    Physical Exam Alert and in no distress.  Nasal mucosa is normal.  Nontender sinuses.  Tympanic membranes and canals are normal. Pharyngeal area is normal. Neck is supple without adenopathy or thyromegaly. Cardiac exam shows a regular sinus rhythm without murmurs or gallops. Lungs are clear to auscultation.       Assessment & Plan:  Assessment and Plan    Acute sinusitis and acute bronchitis Symptoms persisted beyond typical viral course, indicating possible secondary bacterial infection. - I will call in Augmentin  - Advised to send a message via MyChart if symptoms persist for follow-up.        "

## 2024-08-12 ENCOUNTER — Ambulatory Visit: Admitting: Plastic Surgery

## 2024-08-12 VITALS — BP 156/86 | HR 78 | Ht 71.0 in | Wt 235.0 lb

## 2024-08-12 DIAGNOSIS — L989 Disorder of the skin and subcutaneous tissue, unspecified: Secondary | ICD-10-CM

## 2024-08-12 NOTE — Progress Notes (Signed)
 James Bird is referred for evaluation and removal of numerous pigmented lesions across both cheeks and the forehead.  There are no specific lesions that are of concern.  I discussed with him that this is not a reasonable request and that I am not able to remove all of the lesions on his face.  It would be much more advisable to see a dermatologist and have the lesions evaluated and if there are any specific lesions of concern these can easily be removed in the office with excision.  He will follow-up with his primary care provider to discuss further management.  He is welcome to return if there are any lesions that are felt to be concerning that should be removed.

## 2024-09-14 ENCOUNTER — Other Ambulatory Visit: Payer: Self-pay | Admitting: Medical

## 2024-09-15 NOTE — Telephone Encounter (Signed)
 Is this okay to refill?

## 2024-11-04 ENCOUNTER — Encounter: Payer: Self-pay | Admitting: Medical
